# Patient Record
Sex: Female | Born: 1950 | Race: White | Hispanic: No
Health system: Northeastern US, Academic
[De-identification: ages and names within clinical notes are randomized; demographics above are authoritative.]

## PROBLEM LIST (undated history)

## (undated) DIAGNOSIS — I10 Essential (primary) hypertension: Secondary | ICD-10-CM

## (undated) DIAGNOSIS — M519 Unspecified thoracic, thoracolumbar and lumbosacral intervertebral disc disorder: Secondary | ICD-10-CM

## (undated) DIAGNOSIS — F419 Anxiety disorder, unspecified: Secondary | ICD-10-CM

## (undated) DIAGNOSIS — Z9889 Other specified postprocedural states: Secondary | ICD-10-CM

## (undated) DIAGNOSIS — C439 Malignant melanoma of skin, unspecified: Secondary | ICD-10-CM

## (undated) DIAGNOSIS — Z9851 Tubal ligation status: Secondary | ICD-10-CM

## (undated) DIAGNOSIS — E785 Hyperlipidemia, unspecified: Secondary | ICD-10-CM

## (undated) HISTORY — DX: Malignant melanoma of skin, unspecified: C43.9

## (undated) HISTORY — PX: BACK SURGERY: SHX140

## (undated) HISTORY — PX: ABDOMINAL HYSTERECTOMY: SHX81

## (undated) HISTORY — PX: DILATION AND CURETTAGE OF UTERUS: SHX78

## (undated) HISTORY — PX: TUBAL LIGATION: SHX77

---

## 2006-08-30 ENCOUNTER — Ambulatory Visit: Payer: Self-pay | Admitting: Internal Medicine

## 2007-01-14 ENCOUNTER — Ambulatory Visit: Payer: Self-pay | Admitting: Family Medicine

## 2009-06-02 ENCOUNTER — Ambulatory Visit: Payer: Self-pay

## 2009-06-16 ENCOUNTER — Encounter (INDEPENDENT_AMBULATORY_CARE_PROVIDER_SITE_OTHER): Payer: Self-pay | Admitting: Orthopedic Surgery

## 2009-06-16 ENCOUNTER — Ambulatory Visit (HOSPITAL_COMMUNITY): Admission: RE | Admit: 2009-06-16 | Discharge: 2009-06-17 | Payer: Self-pay | Admitting: Orthopedic Surgery

## 2010-11-23 LAB — BASIC METABOLIC PANEL
CO2: 27 mEq/L (ref 19–32)
Creatinine, Ser: 0.88 mg/dL (ref 0.4–1.2)
GFR calc Af Amer: >60 mL/min (ref >60)
GFR calc non Af Amer: >60 mL/min (ref >60)
Glucose, Bld: 98 mg/dL (ref 70–99)
Potassium: 3.5 mEq/L (ref 3.5–5.1)
Sodium: 137 mEq/L (ref 135–145)

## 2011-01-05 NOTE — Assessment & Plan Note (Signed)
[facility]                           [facility] OFFICE NOTE   JASIRA, ROBINSON                        MRN:          [id number]  DATE:08/30/2006                            DOB:          July 18, 1951    CHIEF COMPLAINT:  Here to establish a new doctor.   HISTORY OF PRESENT ILLNESS:  Ms. Ruben Gottron moved from [place]  approximately two years ago to [place] and then a few months ago  moved to [place].  She moved in the area to be near her daughter  following a recent divorce.  Today, she has the following concerns:  1. Ear pressure and nasal congestion:  She states that she has been      having problems off and on with allergies.  She has a sensation      that there is fluid in her ears as well as chronic nasal      congestion.  Her ears also itch as well as her nose itching.  She      occasionally sneezes.  In the past, she tried Flonase and it helped      significantly.  She would like to try this again.  2. Weight concerns:  Ms. Ruben Gottron states that she has been trying to      lose weight and is having difficulty.  She has a friend who was on      Phentermine and she wants to know the risks and benefits of this      medication.   REVIEW OF SYSTEMS:  Otherwise, negative.   PAST MEDICAL HISTORY:  None.   HOSPITALIZATIONS SURGERIES AND PROCEDURES:  1. Two C-sections.  2. In 1998, total abdominal hysterectomy for menorrhagia. .  3. At age 72, exploratory laparotomy, unknown etiology.  4. BTL.  5. Mammogram negative in 2005.   ALLERGIES:  None.   MEDICATIONS:  None.   SOCIAL HISTORY:  No alcohol, no tobacco, no drugs.  She is currently  unemployed and has not been previously working but is having to get a  job because of a recent divorce.  She was divorced within the last few  years.  She has four children who are healthy.  She walks daily about 6  miles per day.  She eats fruits, vegetables, fish, chicken, and lean  meats.   She avoids fast food.   FAMILY HISTORY:  Father deceased at age 47 following an MI at age 41 and  then, again, at age 77.  He also had diabetes.  Mother alive at age 9  with hypertension, diabetes, and COPD.  She has one brother who passed  away with cystic fibrosis.  She has four sisters, three of them have  diabetes.  There is no family history of any type of cancer.   PHYSICAL EXAMINATION:  VITAL SIGNS:  Height 52 1/2 inches, weight 159.6,  BMI 27, blood pressure 132/100, pulse 80, temperature 98.  GENERAL:  Overweight appearing female in no apparent distress.  HEENT:  PERRLA, extraocular movements intact, oropharynx clear, tympanic  membranes clear, nares with pale turbinates bilaterally,  no thyromegaly,  no lymphadenopathy, supraclavicular or cervical.  CARDIOVASCULAR:  Regular rate and rhythm, no murmurs, gallops, and rubs.  Normal PMI.  2+ peripheral pulses, no peripheral edema.  PULMONARY:  Clear to auscultation bilaterally, no wheezes, rales or  rhonchi.  ABDOMEN:  Soft, nontender, normal active bowel sounds, no  hepatosplenomegaly.  MUSCULOSKELETAL:  Strength 5/5 in the upper and lower extremities.  NEUROLOGICAL:  Cranial nerves 2-12 grossly intact.  Alert and oriented  x3.   ASSESSMENT/PLAN:  1. Allergic rhinitis:  Flonase 2 sprays per nostril daily.  She will      let me know if her symptoms do not improve.  2. Overweight:  We discussed healthy eating habits and increasing      exercise.  We discussed Phentermine and the fact that, given risk      benefit ratio, I do not believe this is a safe medication for her.      I did suggest that Orlistat may be something that would help her      more, although it does have GI side-effects.  If her blood pressure      is well controlled over the next few months and does not seem to be      elevated and this is an isolated measurement, given her new visit      and possible white coat syndrome, we can consider Meridia.  3.  Elevated blood pressure:  She will follow this at home and let me      know if it is greater than 140/90 for three measurements in a row.  4. Prevention:  She is due for a mammogram, colon cancer screening,      tetanus, and cholesterol panel.  She will look into insurance and      let me know if she would like to obtain these.  She was given a      tetanus vaccine today.  I will obtain records from her previous      doctor.     Kerby Nora, MD  Electronically Signed    AB/MedQ  DD: 08/30/2006  DT: 08/31/2006  Job #: [id number]

## 2012-01-03 ENCOUNTER — Ambulatory Visit: Payer: Self-pay | Admitting: Physical Medicine and Rehabilitation

## 2014-09-02 ENCOUNTER — Ambulatory Visit: Payer: Self-pay | Admitting: Physical Medicine and Rehabilitation

## 2015-04-15 ENCOUNTER — Encounter: Payer: Self-pay | Admitting: *Deleted

## 2015-04-18 ENCOUNTER — Encounter: Payer: Self-pay | Admitting: Anesthesiology

## 2015-04-18 ENCOUNTER — Ambulatory Visit: Payer: BLUE CROSS/BLUE SHIELD | Admitting: Anesthesiology

## 2015-04-18 ENCOUNTER — Encounter
Admission: RE | Disposition: A | Discharge status: Home / Self Care | Payer: Self-pay | Source: Ambulatory Visit | Attending: Gastroenterology

## 2015-04-18 ENCOUNTER — Ambulatory Visit
Admission: RE | Admit: 2015-04-18 | Discharge: 2015-04-18 | Disposition: A | Discharge status: Home / Self Care | Payer: BLUE CROSS/BLUE SHIELD | Source: Ambulatory Visit | Attending: Gastroenterology | Admitting: Gastroenterology

## 2015-04-18 DIAGNOSIS — Z90710 Acquired absence of both cervix and uterus: Secondary | ICD-10-CM | POA: Insufficient documentation

## 2015-04-18 DIAGNOSIS — K5730 Diverticulosis of large intestine without perforation or abscess without bleeding: Secondary | ICD-10-CM | POA: Insufficient documentation

## 2015-04-18 DIAGNOSIS — M5136 Other intervertebral disc degeneration, lumbar region: Secondary | ICD-10-CM | POA: Diagnosis not present

## 2015-04-18 DIAGNOSIS — F419 Anxiety disorder, unspecified: Secondary | ICD-10-CM | POA: Insufficient documentation

## 2015-04-18 DIAGNOSIS — E785 Hyperlipidemia, unspecified: Secondary | ICD-10-CM | POA: Diagnosis not present

## 2015-04-18 DIAGNOSIS — K644 Residual hemorrhoidal skin tags: Secondary | ICD-10-CM | POA: Diagnosis not present

## 2015-04-18 DIAGNOSIS — Z1211 Encounter for screening for malignant neoplasm of colon: Secondary | ICD-10-CM | POA: Diagnosis present

## 2015-04-18 DIAGNOSIS — K6389 Other specified diseases of intestine: Secondary | ICD-10-CM | POA: Diagnosis not present

## 2015-04-18 DIAGNOSIS — I10 Essential (primary) hypertension: Secondary | ICD-10-CM | POA: Diagnosis not present

## 2015-04-18 DIAGNOSIS — D124 Benign neoplasm of descending colon: Secondary | ICD-10-CM | POA: Diagnosis not present

## 2015-04-18 HISTORY — DX: Tubal ligation status: Z98.51

## 2015-04-18 HISTORY — DX: Unspecified thoracic, thoracolumbar and lumbosacral intervertebral disc disorder: M51.9

## 2015-04-18 HISTORY — DX: Other specified postprocedural states: Z98.890

## 2015-04-18 HISTORY — PX: COLONOSCOPY WITH PROPOFOL: SHX5780

## 2015-04-18 HISTORY — DX: Hyperlipidemia, unspecified: E78.5

## 2015-04-18 HISTORY — DX: Anxiety disorder, unspecified: F41.9

## 2015-04-18 HISTORY — DX: Essential (primary) hypertension: I10

## 2015-04-18 SURGERY — COLONOSCOPY WITH PROPOFOL
Anesthesia: General

## 2015-04-18 MED ORDER — PROPOFOL 10 MG/ML IV BOLUS
INTRAVENOUS | Status: DC | PRN
  Administered 2015-04-18: 40 mg via INTRAVENOUS
  Administered 2015-04-18: 70 mg via INTRAVENOUS
  Administered 2015-04-18: 30 mg via INTRAVENOUS
  Administered 2015-04-18: 40 mg via INTRAVENOUS

## 2015-04-18 MED ORDER — SODIUM CHLORIDE 0.9 % IV SOLN
INTRAVENOUS | Status: DC
  Administered 2015-04-18: 09:00:00 via INTRAVENOUS

## 2015-04-18 MED ORDER — SODIUM CHLORIDE 0.9 % IV SOLN: INTRAVENOUS | Status: DC

## 2015-04-18 MED ORDER — ONDANSETRON HCL 4 MG/2ML IJ SOLN
INTRAMUSCULAR | Status: DC | PRN
  Administered 2015-04-18: 4 mg via INTRAVENOUS

## 2015-04-18 MED ORDER — PROPOFOL INFUSION 10 MG/ML OPTIME
INTRAVENOUS | Status: DC | PRN
  Administered 2015-04-18: 100 ug/kg/min via INTRAVENOUS

## 2015-04-18 MED ORDER — GLYCOPYRROLATE 0.2 MG/ML IJ SOLN
INTRAMUSCULAR | Status: DC | PRN
  Administered 2015-04-18: 0.2 mg via INTRAVENOUS

## 2015-04-18 MED ORDER — LIDOCAINE HCL (CARDIAC) 20 MG/ML IV SOLN
INTRAVENOUS | Status: DC | PRN
  Administered 2015-04-18: 20 mg via INTRAVENOUS

## 2015-04-18 NOTE — Anesthesia Postprocedure Evaluation (Signed)
  Anesthesia Post-op Note  Patient: Shelby Douglas  Procedure(s) Performed: Procedure(s): COLONOSCOPY WITH PROPOFOL (N/A)  Anesthesia type:General  Patient location: PACU  Post pain: Pain level controlled  Post assessment: Post-op Vital signs reviewed, Patient's Cardiovascular Status Stable, Respiratory Function Stable, Patent Airway and No signs of Nausea or vomiting  Post vital signs: Reviewed and stable  Last Vitals:  Filed Vitals:   04/18/15 1019  BP: 105/60  Pulse:   Temp:   Resp:     Level of consciousness: awake, alert  and patient cooperative  Complications: No apparent anesthesia complications

## 2015-04-18 NOTE — Discharge Instructions (Signed)

## 2015-04-18 NOTE — H&P (Signed)
  Primary Care Physician:  Eliezer Lofts, MD  Pre-Procedure History & Physical: HPI:  Shelby Douglas is a 64 y.o. female is here for an colonoscopy.   Past Medical History  Diagnosis Date  . Hypertension   . Lumbar disc disease   . Hyperlipidemia   . Anxiety   . H/O tubal ligation   . H/O dilation and curettage     Past Surgical History  Procedure Laterality Date  . Abdominal hysterectomy    . Dilation and curettage of uterus    . Cesarean section      Prior to Admission medications   Medication Sig Start Date End Date Taking? Authorizing Provider  citalopram (CELEXA) 10 MG tablet Take 10 mg by mouth daily.   Yes Historical Provider, MD  cyclobenzaprine (FLEXERIL) 10 MG tablet Take 10 mg by mouth at bedtime as needed for muscle spasms.   Yes Historical Provider, MD  fluticasone (FLONASE) 50 MCG/ACT nasal spray Place into both nostrils daily.   Yes Historical Provider, MD  gabapentin (NEURONTIN) 300 MG capsule Take 300 mg by mouth 3 (three) times daily.   Yes Historical Provider, MD  lisinopril-hydrochlorothiazide (PRINZIDE,ZESTORETIC) 20-12.5 MG per tablet Take 1 tablet by mouth daily.   Yes Historical Provider, MD  meloxicam (MOBIC) 15 MG tablet Take 15 mg by mouth daily.   Yes Historical Provider, MD  traMADol (ULTRAM) 50 MG tablet Take by mouth 2 (two) times daily.   Yes Historical Provider, MD    Allergies as of 04/01/2015  . (Not on File)    No family history on file.  Social History   Social History  . Marital Status: Married    Spouse Name: N/A  . Number of Children: N/A  . Years of Education: N/A   Occupational History  . Not on file.   Social History Main Topics  . Smoking status: Never Smoker   . Smokeless tobacco: Never Used  . Alcohol Use: Not on file  . Drug Use: Not on file  . Sexual Activity: Not on file   Other Topics Concern  . Not on file   Social History Narrative  . No narrative on file     Physical Exam: BP 119/95 mmHg   Pulse 90  Temp(Src) 98.6 F (37 C) (Tympanic)  Resp 17  Ht 5\' 3"  (1.6 m)  Wt 77.111 kg (170 lb)  BMI 30.12 kg/m2  SpO2 100% General:   Alert,  pleasant and cooperative in NAD Head:  Normocephalic and atraumatic. Neck:  Supple; no masses or thyromegaly. Lungs:  Clear throughout to auscultation.    Heart:  Regular rate and rhythm. Abdomen:  Soft, nontender and nondistended. Normal bowel sounds, without guarding, and without rebound.   Neurologic:  Alert and  oriented x4;  grossly normal neurologically.  Impression/Plan: Shelby Douglas is here for an colonoscopy to be performed for screening  Risks, benefits, limitations, and alternatives regarding  colonoscopy have been reviewed with the patient.  Questions have been answered.  All parties agreeable.   Josefine Class, MD  04/18/2015, 9:02 AM

## 2015-04-18 NOTE — Anesthesia Preprocedure Evaluation (Signed)
Anesthesia Evaluation  Patient identified by MRN, date of birth, ID band Patient awake    Reviewed: Allergy & Precautions, H&P , NPO status , Patient's Chart, lab work & pertinent test results, reviewed documented beta blocker date and time   History of Anesthesia Complications Negative for: history of anesthetic complications  Airway Mallampati: II  TM Distance: >3 FB Neck ROM: full    Dental no notable dental hx. (+) Teeth Intact Permanent bridge on top left:   Pulmonary neg pulmonary ROS,  breath sounds clear to auscultation  Pulmonary exam normal       Cardiovascular Exercise Tolerance: Good hypertension, On Medications - angina- CAD, - Past MI, - Cardiac Stents and - CABG Normal cardiovascular exam- dysrhythmias - Valvular Problems/MurmursRhythm:regular Rate:Normal     Neuro/Psych PSYCHIATRIC DISORDERS (anxiety)  Neuromuscular disease (Lumbar degenerative disc disease)    GI/Hepatic negative GI ROS, Neg liver ROS,   Endo/Other  negative endocrine ROS  Renal/GU negative Renal ROS  negative genitourinary   Musculoskeletal   Abdominal   Peds  Hematology negative hematology ROS (+)   Anesthesia Other Findings Past Medical History:   Hypertension                                                 Lumbar disc disease                                          Hyperlipidemia                                               Anxiety                                                      H/O tubal ligation                                           H/O dilation and curettage                                   Reproductive/Obstetrics negative OB ROS                             Anesthesia Physical Anesthesia Plan  ASA: II  Anesthesia Plan: General   Post-op Pain Management:    Induction:   Airway Management Planned:   Additional Equipment:   Intra-op Plan:   Post-operative Plan:   Informed  Consent: I have reviewed the patients History and Physical, chart, labs and discussed the procedure including the risks, benefits and alternatives for the proposed anesthesia with the patient or authorized representative who has indicated his/her understanding and acceptance.   Dental Advisory Given  Plan Discussed with: Anesthesiologist, CRNA and Surgeon  Anesthesia Plan Comments:  Anesthesia Quick Evaluation  

## 2015-04-18 NOTE — Op Note (Signed)
[facility] Patient Name: Shelby Douglas Procedure Date: 04/18/2015 9:04 AM MRN: [id number] Account #: [id number] Date of Birth: [birth year] Admit Type: Outpatient Age: 64 Room: [facility] ROOM 3 Gender: Female Note Status: Finalized Procedure:         Colonoscopy Indications:       Screening for colorectal malignant neoplasm, This is the                     patient's first colonoscopy Patient Profile:   This is a 64 year old female. Providers:         Gerrit Heck. Rayann Heman, MD Referring MD:      Leonie Douglas. Doy Hutching, MD (Referring MD) Medicines:         Propofol per Anesthesia Complications:     No immediate complications. Procedure:         Pre-Anesthesia Assessment:                    - Prior to the procedure, a History and Physical was                     performed, and patient medications, allergies and                     sensitivities were reviewed. The patient's tolerance of                     previous anesthesia was reviewed.                    After obtaining informed consent, the colonoscope was                     passed under direct vision. Throughout the procedure, the                     patient's blood pressure, pulse, and oxygen saturations                     were monitored continuously. The Colonoscope was                     introduced through the anus and advanced to the the cecum,                     identified by appendiceal orifice and ileocecal valve. The                     colonoscopy was somewhat difficult due to a tortuous                     colon. Successful completion of the procedure was aided by                     changing the patient to a supine position. The patient                     tolerated the procedure well. The quality of the bowel                     preparation was good. Findings:      The perianal exam findings include non-thrombosed external hemorrhoids.      A 3 mm polyp was found in the descending colon.  The polyp was sessile.  The polyp was removed with a jumbo cold forceps. Resection and retrieval       were complete.      Many small-mouthed diverticula were found in the sigmoid colon.      The exam was otherwise without abnormality on direct and retroflexion       views. Impression:        - Non-thrombosed external hemorrhoids found on perianal                     exam.                    - One 3 mm polyp in the descending colon. Resected and                     retrieved.                    - Diverticulosis in the sigmoid colon.                    - The examination was otherwise normal on direct and                     retroflexion views. Recommendation:    - Observe patient in GI recovery unit.                    - High fiber diet.                    - Continue present medications.                    - Await pathology results.                    - Repeat colonoscopy for surveillance based on pathology                     results.                    - Return to referring physician.                    - The findings and recommendations were discussed with the                     patient.                    - The findings and recommendations were discussed with the                     patient's family. Procedure Code(s): --- Professional ---                    [phone], Colonoscopy, flexible; with biopsy, single or                     multiple CPT copyright 2014 American Medical Association. All rights reserved. The codes documented in this report are preliminary and upon coder review may  be revised to meet current compliance requirements. Mellody Life, MD 04/18/2015 9:41:25 AM This report has been signed electronically. Number of Addenda: 0 Note Initiated On: 04/18/2015 9:04 AM Scope Withdrawal Time: 0 hours 11 minutes 11 seconds  Total Procedure Duration: 0 hours 20 minutes 57 seconds       [place]  [facility]

## 2015-04-18 NOTE — Transfer of Care (Signed)
Immediate Anesthesia Transfer of Care Note  Patient: Shelby Douglas  Procedure(s) Performed: Procedure(s): COLONOSCOPY WITH PROPOFOL (N/A)  Patient Location: Endoscopy Unit  Anesthesia Type:General  Level of Consciousness: sedated  Airway & Oxygen Therapy: Patient Spontanous Breathing and Patient connected to nasal cannula oxygen  Post-op Assessment: Report given to RN and Post -op Vital signs reviewed and stable  Post vital signs: Reviewed and stable  Last Vitals:  Filed Vitals:   04/18/15 0824  BP: 119/95  Pulse: 90  Temp: 37 C  Resp: 17    Complications: No apparent anesthesia complications

## 2015-04-18 NOTE — Anesthesia Procedure Notes (Signed)
Date/Time: 04/18/2015 9:10 AM Performed by: Martha Clan Pre-anesthesia Checklist: Patient identified, Emergency Drugs available, Suction available, Patient being monitored and Timeout performed Patient Re-evaluated:Patient Re-evaluated prior to inductionOxygen Delivery Method: Nasal cannula Preoxygenation: Pre-oxygenation with 100% oxygen Intubation Type: IV induction

## 2015-04-19 ENCOUNTER — Encounter: Payer: Self-pay | Admitting: Gastroenterology

## 2015-04-19 LAB — SURGICAL PATHOLOGY

## 2016-03-06 ENCOUNTER — Ambulatory Visit (INDEPENDENT_AMBULATORY_CARE_PROVIDER_SITE_OTHER): Payer: BLUE CROSS/BLUE SHIELD | Admitting: Obstetrics and Gynecology

## 2016-03-06 ENCOUNTER — Encounter: Payer: Self-pay | Admitting: Obstetrics and Gynecology

## 2016-03-06 VITALS — BP 97/66 | HR 94 | Ht 63.0 in | Wt 168.1 lb

## 2016-03-06 DIAGNOSIS — N8110 Cystocele, unspecified: Secondary | ICD-10-CM | POA: Diagnosis not present

## 2016-03-06 DIAGNOSIS — N816 Rectocele: Secondary | ICD-10-CM | POA: Diagnosis not present

## 2016-03-06 DIAGNOSIS — N958 Other specified menopausal and perimenopausal disorders: Secondary | ICD-10-CM

## 2016-03-06 DIAGNOSIS — Z9079 Acquired absence of other genital organ(s): Secondary | ICD-10-CM | POA: Diagnosis not present

## 2016-03-06 DIAGNOSIS — Z90722 Acquired absence of ovaries, bilateral: Secondary | ICD-10-CM

## 2016-03-06 DIAGNOSIS — N3946 Mixed incontinence: Secondary | ICD-10-CM

## 2016-03-06 DIAGNOSIS — IMO0002 Reserved for concepts with insufficient information to code with codable children: Secondary | ICD-10-CM

## 2016-03-06 DIAGNOSIS — N952 Postmenopausal atrophic vaginitis: Secondary | ICD-10-CM | POA: Diagnosis not present

## 2016-03-06 DIAGNOSIS — K5900 Constipation, unspecified: Secondary | ICD-10-CM

## 2016-03-06 DIAGNOSIS — K5909 Other constipation: Secondary | ICD-10-CM

## 2016-03-06 DIAGNOSIS — E8940 Asymptomatic postprocedural ovarian failure: Secondary | ICD-10-CM

## 2016-03-06 DIAGNOSIS — Z90710 Acquired absence of both cervix and uterus: Secondary | ICD-10-CM | POA: Diagnosis not present

## 2016-03-06 MED ORDER — ESTRADIOL 0.1 MG/GM VA CREA: TOPICAL_CREAM | VAGINAL | Status: AC

## 2016-03-06 MED ORDER — TOLTERODINE TARTRATE ER 4 MG PO CP24: 4.0000 mg | ORAL_CAPSULE | Freq: Every evening | ORAL | Status: DC

## 2016-03-06 NOTE — Progress Notes (Signed)
GYN ENCOUNTER NOTE  Subjective:       Shelby Douglas is a 65 y.o. [id number] female is here for gynecologic evaluation of the following issues:  1. Stress urinary incontinence  GU history: Urinary frequency-6-7 times a day Nocturia-2 times a night.   Urgency-positive UTI history-no chronic infections Incomplete emptying-negative Positive Leaking with coughing sneezing laughing lifting; wears pads; no history of interventions such as Kegel exercises, pessary, etc.  GI history: Long history of chronic constipation; on MiraLAX and Colace Stools are formed and soft Positive splinting with bowel movements       Gynecologic History No LMP recorded. Patient has had a hysterectomy. Status post TAH/BSO  Contraception: status post hysterectomy  Family history of breast cancer: Maternal grandmother and daughter (age 65)    Obstetric History OB History  Gravida Para Term Preterm AB SAB TAB Ectopic Multiple Living  6 4 3 1 2 2    4     # Outcome Date GA Lbr Len/2nd Weight Sex Delivery Anes PTL Lv  6 Preterm 1986   4 lb (1.814 kg) F CS-LTranv   Y  5 SAB 1985          4 Term 1981   6 lb (2.722 kg) F CS-LTranv   Y  3 Term 1980   8 lb (3.629 kg) F Vag-Spont   Y  2 Term 1973   7 lb 8 oz (3.402 kg) F Vag-Spont   Y  1 SAB 1972              Past Medical History  Diagnosis Date  . Hypertension   . Lumbar disc disease   . Hyperlipidemia   . Anxiety   . H/O tubal ligation   . H/O dilation and curettage     Past Surgical History  Procedure Laterality Date  . Abdominal hysterectomy    . Dilation and curettage of uterus    . Cesarean section    . Colonoscopy with propofol N/A 04/18/2015    Procedure: COLONOSCOPY WITH PROPOFOL;  Surgeon: Josefine Class, MD;  Location: [facility] ENDOSCOPY;  Service: Endoscopy;  Laterality: N/A;    Current Outpatient Prescriptions on File Prior to Visit  Medication Sig Dispense Refill  . citalopram (CELEXA) 10 MG tablet Take 10 mg by mouth  daily.    . cyclobenzaprine (FLEXERIL) 10 MG tablet Take 10 mg by mouth at bedtime as needed for muscle spasms.    . fluticasone (FLONASE) 50 MCG/ACT nasal spray Place into both nostrils daily.    Marland Kitchen gabapentin (NEURONTIN) 300 MG capsule Take 300 mg by mouth 3 (three) times daily.    Marland Kitchen lisinopril-hydrochlorothiazide (PRINZIDE,ZESTORETIC) 20-12.5 MG per tablet Take 1 tablet by mouth daily.    . meloxicam (MOBIC) 15 MG tablet Take 15 mg by mouth daily.    . traMADol (ULTRAM) 50 MG tablet Take by mouth 2 (two) times daily.     No current facility-administered medications on file prior to visit.    Allergies  Allergen Reactions  . Latex     Social History   Social History  . Marital Status: Married    Spouse Name: N/A  . Number of Children: N/A  . Years of Education: N/A   Occupational History  . Not on file.   Social History Main Topics  . Smoking status: Never Smoker   . Smokeless tobacco: Never Used  . Alcohol Use: Yes     Comment: monthly  . Drug Use: No  . Sexual Activity:  Yes    Birth Control/ Protection: Surgical   Other Topics Concern  . Not on file   Social History Narrative    Family History  Problem Relation Age of Onset  . Diabetes Mother   . Heart disease Mother   . Diabetes Father   . Heart disease Father   . Diabetes Maternal Grandmother   . Heart disease Maternal Grandmother   . Diabetes Maternal Grandfather   . Heart disease Maternal Grandfather   . Breast cancer Paternal Grandmother   . Uterine cancer Paternal Grandmother   . Diabetes Paternal Grandmother   . Heart disease Paternal Grandmother   . Diabetes Paternal Grandfather   . Heart disease Paternal Grandfather   . Ovarian cancer Neg Hx   . Colon cancer Neg Hx     The following portions of the patient's history were reviewed and updated as appropriate: allergies, current medications, past family history, past medical history, past social history, past surgical history and problem  list.  Review of Systems Per history of present illness  Objective:   BP 97/66 mmHg  Pulse 94  Ht 5\' 3"  (1.6 m)  Wt 168 lb 1.6 oz (76.25 kg)  BMI 29.79 kg/m2 CONSTITUTIONAL: Well-developed, well-nourished female in no acute distress.  HENT:  Normocephalic, atraumatic.  NECK: Not examined SKIN: Skin is warm and dry. No rash noted. Not diaphoretic. No erythema. No pallor. [place]: Alert and oriented to person, place, and time. PSYCHIATRIC: Normal mood and affect. Normal behavior. Normal judgment and thought content. CARDIOVASCULAR:Not Examined RESPIRATORY: Not Examined BREASTS: Not Examined ABDOMEN: Soft, non distended; Non tender.  No Organomegaly. PELVIC:  External Genitalia: Normal  BUS: Normal  Vagina: Moderate atrophy; fair support (first to second-degree cystocele with Valsalva; mild rectocele)  Cervix:Surgically absent  Uterus:Surgically absent  Adnexa: Normal  RV: Normal external exam; unable to tolerate rectal  Bladder: Nontender MUSCULOSKELETAL: Normal range of motion. No tenderness.  No cyanosis, clubbing, or edema.     Assessment:   1. Mixed incontinence 2. Status post TAH/BSO 3. Menopausal, on no HRT 4. Moderate vaginal atrophy 5. First to second-degree cystocele 6. Mild rectocele   Plan:   1. Detrol LA 4 mg daily at bedtime 2. Estrace cream intravaginal twice weekly 3. Kegel exercises 4. Physical therapy for stress urinary incontinence-declined  5. Urology referral for vaginal sling 6. Return in 3 months  A total of 30 minutes were spent face-to-face with the patient during this encounter and over half of that time dealt with counseling and coordination of care.  Brayton Mars, MD  Note: This dictation was prepared with Dragon dictation along with smaller phrase technology. Any transcriptional errors that result from this process are unintentional.

## 2016-03-06 NOTE — Patient Instructions (Signed)
1. Begin Estrace cream 1/2 g intravaginal twice a week 2. Begin Detrol LA 4 mg at night 3. Urology referral for sling 4. Return in 3 months for follow-up  Urinary Incontinence Urinary incontinence is the involuntary loss of urine from your bladder. CAUSES  There are many causes of urinary incontinence. They include:  Medicines.  Infections.  Prostatic enlargement, leading to overflow of urine from your bladder.  Surgery.  Neurological diseases.  Emotional factors. SIGNS AND SYMPTOMS Urinary Incontinence can be divided into four types: 1. Urge incontinence. Urge incontinence is the involuntary loss of urine before you have the opportunity to go to the bathroom. There is a sudden urge to void but not enough time to reach a bathroom. 2. Stress incontinence. Stress incontinence is the sudden loss of urine with any activity that forces urine to pass. It is commonly caused by anatomical changes to the pelvis and sphincter areas of your body. 3. Overflow incontinence. Overflow incontinence is the loss of urine from an obstructed opening to your bladder. This results in a backup of urine and a resultant buildup of pressure within the bladder. When the pressure within the bladder exceeds the closing pressure of the sphincter, the urine overflows, which causes incontinence, similar to water overflowing a dam. 4. Total incontinence. Total incontinence is the loss of urine as a result of the inability to store urine within your bladder. DIAGNOSIS  Evaluating the cause of incontinence may require:  A thorough and complete medical and obstetric history.  A complete physical exam.  Laboratory tests such as a urine culture and sensitivities. When additional tests are indicated, they can include:  An ultrasound exam.  Kidney and bladder X-rays.  Cystoscopy. This is an exam of the bladder using a narrow scope.  Urodynamic testing to test the nerve function to the bladder and sphincter  areas. TREATMENT  Treatment for urinary incontinence depends on the cause:  For urge incontinence caused by a bacterial infection, antibiotics will be prescribed. If the urge incontinence is related to medicines you take, your health care provider may have you change the medicine.  For stress incontinence, surgery to re-establish anatomical support to the bladder or sphincter, or both, will often correct the condition.  For overflow incontinence caused by an enlarged prostate, an operation to open the channel through the enlarged prostate will allow the flow of urine out of the bladder. In women with fibroids, a hysterectomy may be recommended.  For total incontinence, surgery on your urinary sphincter may help. An artificial urinary sphincter (an inflatable cuff placed around the urethra) may be required. In women who have developed a hole-like passage between their bladder and vagina (vesicovaginal fistula), surgery to close the fistula often is required. HOME CARE INSTRUCTIONS  Normal daily hygiene and the use of pads or adult diapers that are changed regularly will help prevent odors and skin damage.  Avoid caffeine. It can overstimulate your bladder.  Use the bathroom regularly. Try about every 2-3 hours to go to the bathroom, even if you do not feel the need to do so. Take time to empty your bladder completely. After urinating, wait a minute. Then try to urinate again.  For causes involving nerve dysfunction, keep a log of the medicines you take and a journal of the times you go to the bathroom. SEEK MEDICAL CARE IF:  You experience worsening of pain instead of improvement in pain after your procedure.  Your incontinence becomes worse instead of better. SEE IMMEDIATE MEDICAL CARE  IF:  You experience fever or shaking chills.  You are unable to pass your urine.  You have redness spreading into your groin or down into your thighs. MAKE SURE YOU:   Understand these instructions.    Will watch your condition.  Will get help right away if you are not doing well or get worse.   This information is not intended to replace advice given to you by your health care provider. Make sure you discuss any questions you have with your health care provider.   Document Released: 09/13/2004 Document Revised: 08/27/2014 Document Reviewed: 01/13/2013 Elsevier Interactive Patient Education [employer].

## 2016-03-06 NOTE — Addendum Note (Signed)
Addended by: Malachi Paradise on: 03/06/2016 03:54 PM   Modules accepted: Orders, SmartSet

## 2016-03-26 ENCOUNTER — Encounter: Payer: Self-pay | Admitting: Urology

## 2016-03-26 ENCOUNTER — Ambulatory Visit (INDEPENDENT_AMBULATORY_CARE_PROVIDER_SITE_OTHER): Payer: BLUE CROSS/BLUE SHIELD | Admitting: Urology

## 2016-03-26 VITALS — BP 94/66 | HR 101 | Ht 67.0 in | Wt 171.8 lb

## 2016-03-26 DIAGNOSIS — N3946 Mixed incontinence: Secondary | ICD-10-CM | POA: Diagnosis not present

## 2016-03-26 LAB — MICROSCOPIC EXAMINATION

## 2016-03-26 LAB — URINALYSIS, COMPLETE
BILIRUBIN UA: NEGATIVE
GLUCOSE, UA: NEGATIVE
Nitrite, UA: NEGATIVE
RBC UA: NEGATIVE
Specific Gravity, UA: 1.025 (ref 1.005–1.030)
UUROB: 1 mg/dL (ref 0.2–1.0)
pH, UA: 5.5 (ref 5.0–7.5)

## 2016-03-26 LAB — BLADDER SCAN AMB NON-IMAGING: SCAN RESULT: 17

## 2016-03-26 NOTE — Addendum Note (Signed)
Addended by: Wilson Singer on: 03/26/2016 02:24 PM   Modules accepted: Orders

## 2016-03-26 NOTE — Progress Notes (Signed)
03/26/2016 1:57 PM   Shelby Douglas [birth year] [id number]  Referring provider: Jinny Sanders, MD [address] [place], [place] [address]  Chief Complaint  Patient presents with  . New Patient (Initial Visit)    incontinence    The patient HPI: The patient recently was assessed by gynecology for incontinence and was given Detrol and offered physical therapy. For the last year the patient sometimes leaks with coughing sneezing but also with bending and lifting. The primary promise urge incontinence wearing 2 pads a day moderately wet. The patient does not complain of bedwetting but gets up 4 times at night. He may be taking a diuretic and denies ankle edema. She voids every one hour and is not certain if she consented to a 2 are moving  Her flow was good. She had ureteroscopy years ago. She's had low back surgery. She has had a hysterectomy  She does not get bladder infections and she has no neurologic issues. She is prone to constipation  On Detrol recently prescribed she no longer has nocturia is now pad free with reduced urge incontinence  Modifying factors: There are no other modifying factors  Associated signs and symptoms: There are no other associated signs and symptoms Aggravating and relieving factors: There are no other aggravating or relieving factors Severity: Moderate Duration: Greatly improved recently   PMH: Past Medical History:  Diagnosis Date  . Anxiety   . H/O dilation and curettage   . H/O tubal ligation   . Hyperlipidemia   . Hypertension   . Lumbar disc disease     Surgical History: Past Surgical History:  Procedure Laterality Date  . ABDOMINAL HYSTERECTOMY    . BACK SURGERY    . CESAREAN SECTION    . COLONOSCOPY WITH PROPOFOL N/A 04/18/2015   Procedure: COLONOSCOPY WITH PROPOFOL;  Surgeon: Josefine Class, MD;  Location[facility] ENDOSCOPY;  Service: Endoscopy;  Laterality: N/A;  . DILATION AND CURETTAGE OF UTERUS    . TUBAL  LIGATION      Home Medications:    Medication List       Accurate as of 03/26/16  1:57 PM. Always use your most recent med list.          citalopram 10 MG tablet Commonly known as:  CELEXA Take 10 mg by mouth daily.   cyclobenzaprine 10 MG tablet Commonly known as:  FLEXERIL Take 10 mg by mouth at bedtime as needed for muscle spasms.   estradiol 0.1 MG/GM vaginal cream Commonly known as:  ESTRACE VAGINAL 1/2 g twice a week intravaginal   fluticasone 50 MCG/ACT nasal spray Commonly known as:  FLONASE Place into both nostrils daily.   gabapentin 300 MG capsule Commonly known as:  NEURONTIN Take 300 mg by mouth 3 (three) times daily.   lisinopril-hydrochlorothiazide 20-12.5 MG tablet Commonly known as:  PRINZIDE,ZESTORETIC Take 1 tablet by mouth daily.   meloxicam 15 MG tablet Commonly known as:  MOBIC Take 15 mg by mouth daily.   tolterodine 4 MG 24 hr capsule Commonly known as:  DETROL LA Take 1 capsule (4 mg total) by mouth Nightly.   traMADol 50 MG tablet Commonly known as:  ULTRAM Take by mouth 2 (two) times daily.       Allergies:  Allergies  Allergen Reactions  . Latex     Family History: Family History  Problem Relation Age of Onset  . Diabetes Mother   . Heart disease Mother   . Diabetes Father   .  Heart disease Father   . Diabetes Maternal Grandmother   . Heart disease Maternal Grandmother   . Diabetes Maternal Grandfather   . Heart disease Maternal Grandfather   . Breast cancer Paternal Grandmother   . Uterine cancer Paternal Grandmother   . Diabetes Paternal Grandmother   . Heart disease Paternal Grandmother   . Diabetes Paternal Grandfather   . Heart disease Paternal Grandfather   . Ovarian cancer Neg Hx   . Colon cancer Neg Hx     Social History:  reports that she has never smoked. She has never used smokeless tobacco. She reports that she drinks alcohol. She reports that she does not use drugs.  ROS: UROLOGY Frequent  Urination?: Yes Hard to postpone urination?: Yes Burning/pain with urination?: No Get up at night to urinate?: No Leakage of urine?: Yes Urine stream starts and stops?: No Trouble starting stream?: No Do you have to strain to urinate?: No Blood in urine?: No Urinary tract infection?: No Sexually transmitted disease?: No Injury to kidneys or bladder?: No Painful intercourse?: No Weak stream?: No Currently pregnant?: No Vaginal bleeding?: No Last menstrual period?: No  Gastrointestinal Nausea?: No Vomiting?: No Indigestion/heartburn?: No Diarrhea?: No Constipation?: Yes  Constitutional Fever: No Night sweats?: No Weight loss?: No Fatigue?: No  Skin Skin rash/lesions?: No Itching?: No  Eyes Blurred vision?: No Double vision?: No  Ears/Nose/Throat Sore throat?: No Sinus problems?: No  Hematologic/Lymphatic Swollen glands?: No Easy bruising?: No  Cardiovascular Leg swelling?: No Chest pain?: No  Respiratory Cough?: No Shortness of breath?: No  Endocrine Excessive thirst?: No  Musculoskeletal Back pain?: Yes Joint pain?: No  Neurological Headaches?: No Dizziness?: No  Psychologic Depression?: No Anxiety?: No  Physical Exam: BP 94/66   Pulse (!) 101   Ht 5\' 7"  (1.702 m)   Wt 171 lb 12.8 oz (77.9 kg)   BMI 26.91 kg/m   Constitutional:  Alert and oriented, No acute distress. HEENT: [place] AT, moist mucus membranes.  Trachea midline, no masses. Cardiovascular: No clubbing, cyanosis, or edema. Respiratory: Normal respiratory effort, no increased work of breathing. GI: Abdomen is soft, nontender, nondistended, no abdominal masses GU: No CVA tenderness. Grade 1 hypermobility of the bladder neck and no stress incontinence. No prolapse Skin: No rashes, bruises or suspicious lesions. Lymph: No cervical or inguinal adenopathy. Neurologic: Grossly intact, no focal deficits, moving all 4 extremities. Psychiatric: Normal mood and affect.  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 Her nighttime frequency was moderately severe but is greatly improved as well. She does have a frequent bladder.  The patient has high-grade treatment goals but she agreed with me that she is actually doing very well on Detrol. I will reassess her in 3 months. Pathophysiology of incontinence and role of urodynamics discussed. I did not think she needs to this at this stage.  1. 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 Nocturia 3. Increased frequency   - Urinalysis, Complete   No Follow-up on file.  Reece Packer, MD  [facility] [address], [place],  [address] [phone]

## 2016-04-20 HISTORY — PX: SKIN CANCER EXCISION: SHX779

## 2016-05-17 ENCOUNTER — Emergency Department: Payer: BLUE CROSS/BLUE SHIELD

## 2016-05-17 ENCOUNTER — Observation Stay
Admission: EM | Admit: 2016-05-17 | Discharge: 2016-05-19 | Disposition: A | Discharge status: Home / Self Care | Payer: BLUE CROSS/BLUE SHIELD | Attending: Internal Medicine | Admitting: Internal Medicine

## 2016-05-17 DIAGNOSIS — I959 Hypotension, unspecified: Secondary | ICD-10-CM | POA: Insufficient documentation

## 2016-05-17 DIAGNOSIS — N816 Rectocele: Secondary | ICD-10-CM | POA: Diagnosis not present

## 2016-05-17 DIAGNOSIS — N3946 Mixed incontinence: Secondary | ICD-10-CM | POA: Insufficient documentation

## 2016-05-17 DIAGNOSIS — Z803 Family history of malignant neoplasm of breast: Secondary | ICD-10-CM | POA: Insufficient documentation

## 2016-05-17 DIAGNOSIS — N3289 Other specified disorders of bladder: Secondary | ICD-10-CM | POA: Insufficient documentation

## 2016-05-17 DIAGNOSIS — Z90710 Acquired absence of both cervix and uterus: Secondary | ICD-10-CM | POA: Insufficient documentation

## 2016-05-17 DIAGNOSIS — Z79899 Other long term (current) drug therapy: Secondary | ICD-10-CM | POA: Diagnosis not present

## 2016-05-17 DIAGNOSIS — N170 Acute kidney failure with tubular necrosis: Principal | ICD-10-CM | POA: Insufficient documentation

## 2016-05-17 DIAGNOSIS — M549 Dorsalgia, unspecified: Secondary | ICD-10-CM | POA: Insufficient documentation

## 2016-05-17 DIAGNOSIS — Z91040 Latex allergy status: Secondary | ICD-10-CM | POA: Insufficient documentation

## 2016-05-17 DIAGNOSIS — N19 Unspecified kidney failure: Secondary | ICD-10-CM | POA: Diagnosis present

## 2016-05-17 DIAGNOSIS — I10 Essential (primary) hypertension: Secondary | ICD-10-CM | POA: Diagnosis not present

## 2016-05-17 DIAGNOSIS — Z8049 Family history of malignant neoplasm of other genital organs: Secondary | ICD-10-CM | POA: Diagnosis not present

## 2016-05-17 DIAGNOSIS — S0003XA Contusion of scalp, initial encounter: Secondary | ICD-10-CM | POA: Diagnosis not present

## 2016-05-17 DIAGNOSIS — W19XXXA Unspecified fall, initial encounter: Secondary | ICD-10-CM | POA: Insufficient documentation

## 2016-05-17 DIAGNOSIS — E785 Hyperlipidemia, unspecified: Secondary | ICD-10-CM | POA: Insufficient documentation

## 2016-05-17 DIAGNOSIS — N952 Postmenopausal atrophic vaginitis: Secondary | ICD-10-CM | POA: Diagnosis not present

## 2016-05-17 DIAGNOSIS — F419 Anxiety disorder, unspecified: Secondary | ICD-10-CM | POA: Insufficient documentation

## 2016-05-17 DIAGNOSIS — E871 Hypo-osmolality and hyponatremia: Secondary | ICD-10-CM | POA: Diagnosis not present

## 2016-05-17 DIAGNOSIS — N8110 Cystocele, unspecified: Secondary | ICD-10-CM | POA: Diagnosis not present

## 2016-05-17 DIAGNOSIS — Z8249 Family history of ischemic heart disease and other diseases of the circulatory system: Secondary | ICD-10-CM | POA: Diagnosis not present

## 2016-05-17 DIAGNOSIS — S01112A Laceration without foreign body of left eyelid and periocular area, initial encounter: Secondary | ICD-10-CM | POA: Insufficient documentation

## 2016-05-17 DIAGNOSIS — Z833 Family history of diabetes mellitus: Secondary | ICD-10-CM | POA: Diagnosis not present

## 2016-05-17 DIAGNOSIS — K5909 Other constipation: Secondary | ICD-10-CM | POA: Insufficient documentation

## 2016-05-17 DIAGNOSIS — G8929 Other chronic pain: Secondary | ICD-10-CM | POA: Diagnosis not present

## 2016-05-17 DIAGNOSIS — Z9851 Tubal ligation status: Secondary | ICD-10-CM | POA: Insufficient documentation

## 2016-05-17 MED ORDER — SODIUM CHLORIDE 0.9 % IV BOLUS (SEPSIS)
1000.0000 mL | Freq: Once | INTRAVENOUS | Status: AC
  Administered 2016-05-17: 1000 mL via INTRAVENOUS

## 2016-05-17 NOTE — ED Notes (Signed)
Patient transported to CT 

## 2016-05-17 NOTE — ED Notes (Signed)
MD at bedside. 

## 2016-05-17 NOTE — ED Triage Notes (Addendum)
Pt to triage via w/c; Pt reports "leg gave out", fell hitting corner of bed; c/o generalized HA; hematoma no right eye; lac to left eyelid; pt hypotensive in triage; cervical tenderness with palpation--c collar applied; charge nurse notified and pt taken to room 16 by EDT Mayra

## 2016-05-17 NOTE — ED Provider Notes (Signed)
[facility] Provider Note   First MD Initiated Contact with Patient 05/17/16 2320     (approximate)  I have reviewed the triage vital signs and the nursing notes.   HISTORY  Chief Complaint Fall   HPI Shelby Douglas is a 65 y.o. female with history of hypertension hyperlipidemia and lumbar disc disease presents status post fall. Patient states that she has difficulty with her leg and as a result gave out tonight resulting in her falling hitting the left side of her head against the corner of her bed. Patient admits to generalized headache. Of note patient recently had surgery performed and as such is prescribed tramadol which she stated that she take more than prescribed today patient states that she took an total 5 tramadol today. Patient admits to current headache with the pain score 7 out of 10.   Past Medical History:  Diagnosis Date  . Anxiety   . H/O dilation and curettage   . H/O tubal ligation   . Hyperlipidemia   . Hypertension   . Lumbar disc disease     Patient Active Problem List   Diagnosis Date Noted  . Hypotension 05/18/2016  . Renal failure 05/18/2016  . Chronic constipation 03/06/2016  . 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 Status post total abdominal hysterectomy and bilateral salpingo-oophorectomy (TAH-BSO) 03/06/2016  . 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 Cystocele 03/06/2016  . Rectocele 03/06/2016  . Vaginal atrophy 03/06/2016    Past Surgical History:  Procedure Laterality Date  . ABDOMINAL HYSTERECTOMY    . BACK SURGERY    . CESAREAN SECTION    . COLONOSCOPY WITH PROPOFOL N/A 04/18/2015   Procedure: COLONOSCOPY WITH PROPOFOL;  Surgeon: Josefine Class, MD;  Location: [facility];  Service: Endoscopy;  Laterality: N/A;  . DILATION AND CURETTAGE OF UTERUS    . TUBAL LIGATION      Prior to Admission medications   Medication Sig Start Date End Date Taking?  Authorizing Provider  citalopram (CELEXA) 10 MG tablet Take 10 mg by mouth daily.   Yes Historical Provider, MD  cyclobenzaprine (FLEXERIL) 10 MG tablet Take 10 mg by mouth at bedtime as needed for muscle spasms.   Yes Historical Provider, MD  estradiol (ESTRACE VAGINAL) 0.1 MG/GM vaginal cream 1/2 g twice a week intravaginal 03/06/16  Yes Alanda Slim Defrancesco, MD  fluticasone (FLONASE) 50 MCG/ACT nasal spray Place into both nostrils daily.   Yes Historical Provider, MD  gabapentin (NEURONTIN) 300 MG capsule Take 300 mg by mouth 3 (three) times daily.   Yes Historical Provider, MD  lisinopril-hydrochlorothiazide (PRINZIDE,ZESTORETIC) 20-12.5 MG per tablet Take 1 tablet by mouth daily.   Yes Historical Provider, MD  meloxicam (MOBIC) 15 MG tablet Take 15 mg by mouth daily.   Yes Historical Provider, MD  tolterodine (DETROL LA) 4 MG 24 hr capsule Take 1 capsule (4 mg total) by mouth Nightly. 03/06/16  Yes Alanda Slim Defrancesco, MD  traMADol (ULTRAM) 50 MG tablet Take by mouth 2 (two) times daily.   Yes Historical Provider, MD    Allergies Latex  Family History  Problem Relation Age of Onset  . Diabetes Mother   . Heart disease Mother   . Diabetes Father   . Heart disease Father   . Diabetes Maternal Grandmother   . Heart disease Maternal Grandmother   . Diabetes Maternal Grandfather   . Heart disease Maternal Grandfather   . Breast cancer Paternal Grandmother   .  Uterine cancer Paternal Grandmother   . Diabetes Paternal Grandmother   . Heart disease Paternal Grandmother   . Diabetes Paternal Grandfather   . Heart disease Paternal Grandfather   . Ovarian cancer Neg Hx   . Colon cancer Neg Hx     Social History Social History  Substance Use Topics  . Smoking status: Never Smoker  . Smokeless tobacco: Never Used  . Alcohol use No     Comment: monthly    Review of Systems Constitutional: No fever/chills Eyes: No visual changes. ENT: No sore throat. Cardiovascular: Denies chest  pain. Respiratory: Denies shortness of breath. Gastrointestinal: No abdominal pain.  No nausea, no vomiting.  No diarrhea.  No constipation. Genitourinary: Negative for dysuria. Musculoskeletal: Negative for back pain. Skin: Negative for rash. Neurological: Positive for headaches, negative for focal weakness or numbness.  10-point ROS otherwise negative.  ____________________________________________   PHYSICAL EXAM:  VITAL SIGNS: ED Triage Vitals  Enc Vitals Group     BP 05/17/16 2311 (!) 74/50     Pulse Rate 05/17/16 2311 89     Resp 05/17/16 2311 18     Temp 05/17/16 2311 98.2 F (36.8 C)     Temp Source 05/17/16 2311 Oral     SpO2 05/17/16 2311 94 %     Weight 05/17/16 2317 157 lb (71.2 kg)     Height 05/17/16 2317 5' (1.524 m)     Head Circumference --      Peak Flow --      Pain Score 05/17/16 2317 7     Pain Loc --      Pain Edu? --      Excl. in [place]? --     Constitutional: Alert But somnolent. In no apparent distress Eyes: Conjunctivae are normal. PERRL. EOMI. Head: Approximate 5 cm linear laceration left upper eyebrow Ears:  Healthy appearing ear canals and TMs bilaterally Nose: No congestion/rhinnorhea. Mouth/Throat: Mucous membranes are moist.  Oropharynx non-erythematous. Neck: No stridor.  No meningeal signs.  C3-6 to palpation. Cardiovascular: Normal rate, regular rhythm. Good peripheral circulation. Grossly normal heart sounds. Respiratory: Normal respiratory effort.  No retractions. Lungs CTAB. Gastrointestinal: Soft and nontender. No distention.  Musculoskeletal: No lower extremity tenderness nor edema. No gross deformities of extremities. Neurologic:  Normal speech and language. No gross focal neurologic deficits are appreciated.  Skin:  Skin is warm, dry and intact. No rash noted. Psychiatric: Mood and affect are normal. Speech and behavior are normal.  ____________________________________________   LABS (all labs ordered are listed, but only  abnormal results are displayed)  Labs Reviewed  BASIC METABOLIC PANEL - Abnormal; Notable for the following:       Result Value   Sodium 133 (*)    Glucose, Bld 116 (*)    BUN 44 (*)    Creatinine, Ser 2.54 (*)    Calcium 8.2 (*)    GFR calc non Af Amer 19 (*)    GFR calc Af Amer 22 (*)    All other components within normal limits  CBC - Abnormal; Notable for the following:    WBC 11.7 (*)    RBC 3.44 (*)    Hemoglobin 10.2 (*)    HCT 30.0 (*)    RDW 14.6 (*)    All other components within normal limits  BASIC METABOLIC PANEL - Abnormal; Notable for the following:    BUN 42 (*)    Creatinine, Ser 2.27 (*)    Calcium 7.6 (*)  GFR calc non Af Amer 22 (*)    GFR calc Af Amer 25 (*)    Anion gap 4 (*)    All other components within normal limits  TROPONIN I   ____________________________________________  EKG  ED ECG REPORT I, Prue N Annastacia Duba, the attending physician, personally viewed and interpreted this ECG.   Date: 05/18/2016  EKG Time: 12:12 AM  Rate: 83  Rhythm: Normal sinus rhythm  Axis: Normal  Intervals: Normal  ST&T Change: None  ____________________________________________  RADIOLOGY I, Delta N Felix Pratt, personally viewed and evaluated these images (plain radiographs) as part of my medical decision making, as well as reviewing the written report by the radiologist.  Ct Head Wo Contrast  Result Date: 05/18/2016 CLINICAL DATA:  Leg gave fell, fell hitting corner of bed. Generalized headache. LEFT eyelid laceration. Hypotensive. Cervical spine tenderness. History of hypertension. EXAM: CT HEAD WITHOUT CONTRAST CT CERVICAL SPINE WITHOUT CONTRAST TECHNIQUE: Multidetector CT imaging of the head and cervical spine was performed following the standard protocol without intravenous contrast. Multiplanar CT image reconstructions of the cervical spine were also generated. COMPARISON:  None. FINDINGS: CT HEAD FINDINGS BRAIN: The ventricles and sulci are normal. No  intraparenchymal hemorrhage, mass effect nor midline shift. No acute large vascular territory infarcts. No abnormal extra-axial fluid collections. Basal cisterns are patent. 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 VASCULAR: Trace calcific atherosclerosis of the carotid siphons. SKULL/SOFT TISSUES: No skull fracture. Small LEFT periorbital scalp hematoma. No subcutaneous gas or radiopaque foreign bodies. ORBITS/SINUSES: Preservation of the orbital fat. Mildly elongated ocular globes associated myopia or increased intra-ocular pressure. The mastoid air-cells and included paranasal sinuses are well-aerated. OTHER: None. CT CERVICAL SPINE FINDINGS ALIGNMENT: Minimal grade 1 C3-4 anterolisthesis. Maintained lordosis. SKULL BASE AND VERTEBRAE: Cervical vertebral bodies and posterior elements are intact. Moderate C4-5, mild C5-6 disc height loss with uncovertebral hypertrophy. Severe LEFT C2-3 and C3-4 facet arthropathy and mild facet widening most consistent with effusion and periarticular regularity. C1-2 articulation maintained with moderate arthropathy. No destructive bony lesions. SOFT TISSUES AND SPINAL CANAL: Normal. DISC LEVELS: No significant osseous canal stenosis. Moderate to severe LEFT C2-3 and LEFT C3-4, RIGHT C4-5 neural foraminal narrowing. UPPER CHEST: Lung apices are clear. OTHER: None. IMPRESSION: CT HEAD: Small LEFT periorbital scalp hematoma without postseptal extent. No acute intracranial process. **An incidental finding of potential clinical significance has been found. 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 Minimal grade 1 C3-4 anterolisthesis associated with severe LEFT facet arthropathy and chronic inflammatory changes. Electronically Signed   By: Elon Alas M.D.   On: 05/18/2016 00:20   Ct Cervical Spine Wo Contrast  Result Date: 05/18/2016 CLINICAL DATA:   Leg gave fell, fell hitting corner of bed. Generalized headache. LEFT eyelid laceration. Hypotensive. Cervical spine tenderness. History of hypertension. EXAM: CT HEAD WITHOUT CONTRAST CT CERVICAL SPINE WITHOUT CONTRAST TECHNIQUE: Multidetector CT imaging of the head and cervical spine was performed following the standard protocol without intravenous contrast. Multiplanar CT image reconstructions of the cervical spine were also generated. COMPARISON:  None. FINDINGS: CT HEAD FINDINGS BRAIN: The ventricles and sulci are normal. No intraparenchymal hemorrhage, mass effect nor midline shift. No acute large vascular territory infarcts. No abnormal extra-axial fluid collections. Basal cisterns are patent. 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 VASCULAR: Trace calcific atherosclerosis of the  carotid siphons. SKULL/SOFT TISSUES: No skull fracture. Small LEFT periorbital scalp hematoma. No subcutaneous gas or radiopaque foreign bodies. ORBITS/SINUSES: Preservation of the orbital fat. Mildly elongated ocular globes associated myopia or increased intra-ocular pressure. The mastoid air-cells and included paranasal sinuses are well-aerated. OTHER: None. CT CERVICAL SPINE FINDINGS ALIGNMENT: Minimal grade 1 C3-4 anterolisthesis. Maintained lordosis. SKULL BASE AND VERTEBRAE: Cervical vertebral bodies and posterior elements are intact. Moderate C4-5, mild C5-6 disc height loss with uncovertebral hypertrophy. Severe LEFT C2-3 and C3-4 facet arthropathy and mild facet widening most consistent with effusion and periarticular regularity. C1-2 articulation maintained with moderate arthropathy. No destructive bony lesions. SOFT TISSUES AND SPINAL CANAL: Normal. DISC LEVELS: No significant osseous canal stenosis. Moderate to severe LEFT C2-3 and LEFT C3-4, RIGHT C4-5 neural foraminal narrowing. UPPER CHEST: Lung apices are clear. OTHER: None. IMPRESSION: CT HEAD: Small LEFT periorbital scalp hematoma  without postseptal extent. No acute intracranial process. **An incidental finding of potential clinical significance has been found. 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 Minimal grade 1 C3-4 anterolisthesis associated with severe LEFT facet arthropathy and chronic inflammatory changes. Electronically Signed   By: Elon Alas M.D.   On: 05/18/2016 00:20     .Marland KitchenLaceration Repair Date/Time: 05/18/2016 8:18 AM Performed by: Gregor Hams Authorized by: Gregor Hams   Consent:    Consent obtained:  Verbal   Consent given by:  Patient   Risks discussed:  Infection, pain and poor cosmetic result   Alternatives discussed:  No treatment Anesthesia (see MAR for exact dosages):    Anesthesia method:  Local infiltration   Local anesthetic:  Lidocaine 1% w/o epi Laceration details:    Location:  Face   Face location:  L eyebrow   Length (cm):  5   Depth (mm):  1 Repair type:    Repair type:  Simple Pre-procedure details:    Preparation:  Patient was prepped and draped in usual sterile fashion Exploration:    Contaminated: no   Treatment:    Area cleansed with:  Betadine and saline   Amount of cleaning:  Standard   Visualized foreign bodies/material removed: no   Skin repair:    Repair method:  Sutures   Suture size:  6-0   Suture material:  Nylon   Suture technique:  Simple interrupted   Number of sutures:  8 Approximation:    Approximation:  Close   Vermilion border: well-aligned   Post-procedure details:    Dressing:  Antibiotic ointment   Patient tolerance of procedure:  Tolerated well, no immediate complications      INITIAL IMPRESSION / ASSESSMENT AND PLAN / ED COURSE  Pertinent labs & imaging results that were available during my care of the patient were reviewed by me and considered in my medical decision making (see chart for details).  Patient received 2  L IV normal saline with improvement of creatinine from 2.5-2.2 however patient remains hypotensive. As such patient admitted to the hospital for further evaluation and management for acute renal insufficiency   Clinical Course    ____________________________________________  FINAL CLINICAL IMPRESSION(S) / ED DIAGNOSES  Final diagnoses:  Renal failure  Acute renal insufficiency Left eyebrow laceration   MEDICATIONS GIVEN DURING THIS VISIT:  Medications  lidocaine (PF) (XYLOCAINE) 1 % injection (not administered)  fesoterodine (TOVIAZ) tablet 4 mg (not administered)  citalopram (CELEXA) tablet 10 mg (not administered)  fluticasone (  FLONASE) 50 MCG/ACT nasal spray 1 spray (not administered)  heparin injection 5,000 Units (not administered)  0.9 %  sodium chloride infusion (not administered)  acetaminophen (TYLENOL) tablet 650 mg (not administered)    Or  acetaminophen (TYLENOL) suppository 650 mg (not administered)  docusate sodium (COLACE) capsule 100 mg (not administered)  HYDROcodone-acetaminophen (NORCO/VICODIN) 5-325 MG per tablet 1-2 tablet (not administered)  ondansetron (ZOFRAN) tablet 4 mg (not administered)    Or  ondansetron (ZOFRAN) injection 4 mg (not administered)  morphine 2 MG/ML injection 2 mg (not administered)  sodium chloride 0.9 % bolus 1,000 mL (0 mLs Intravenous Stopped 05/18/16 0205)  ketorolac (TORADOL) 30 MG/ML injection 30 mg (30 mg Intravenous Given 05/18/16 0047)  sodium chloride 0.9 % bolus 1,000 mL (0 mLs Intravenous Stopped 05/18/16 0644)     NEW OUTPATIENT MEDICATIONS STARTED DURING THIS VISIT:  Current Discharge Medication List      Current Discharge Medication List      Current Discharge Medication List       Note:  This document was prepared using Dragon voice recognition software and may include unintentional dictation errors.    Gregor Hams, MD 05/18/16 [phone]

## 2016-05-18 ENCOUNTER — Encounter: Payer: Self-pay | Admitting: *Deleted

## 2016-05-18 ENCOUNTER — Observation Stay: Payer: BLUE CROSS/BLUE SHIELD

## 2016-05-18 DIAGNOSIS — N19 Unspecified kidney failure: Secondary | ICD-10-CM | POA: Diagnosis present

## 2016-05-18 DIAGNOSIS — I959 Hypotension, unspecified: Secondary | ICD-10-CM | POA: Diagnosis present

## 2016-05-18 LAB — BASIC METABOLIC PANEL
ANION GAP: 4 — AB (ref 5–15)
Anion gap: 9 (ref 5–15)
BUN: 42 mg/dL — ABNORMAL HIGH (ref 6–20)
BUN: 44 mg/dL — ABNORMAL HIGH (ref 6–20)
CHLORIDE: 102 mmol/L (ref 101–111)
CO2: 22 mmol/L (ref 22–32)
CO2: 24 mmol/L (ref 22–32)
CREATININE: 2.54 mg/dL — AB (ref 0.44–1.00)
Calcium: 7.6 mg/dL — ABNORMAL LOW (ref 8.9–10.3)
Calcium: 8.2 mg/dL — ABNORMAL LOW (ref 8.9–10.3)
Chloride: 107 mmol/L (ref 101–111)
Creatinine, Ser: 2.27 mg/dL — ABNORMAL HIGH (ref 0.44–1.00)
GFR calc non Af Amer: 19 mL/min — ABNORMAL LOW (ref >60)
GFR, EST AFRICAN AMERICAN: 22 mL/min — AB (ref >60)
GFR, EST AFRICAN AMERICAN: 25 mL/min — AB (ref >60)
GFR, EST NON AFRICAN AMERICAN: 22 mL/min — AB (ref >60)
Glucose, Bld: 116 mg/dL — ABNORMAL HIGH (ref 65–99)
Glucose, Bld: 96 mg/dL (ref 65–99)
POTASSIUM: 4 mmol/L (ref 3.5–5.1)
POTASSIUM: 4.4 mmol/L (ref 3.5–5.1)
SODIUM: 133 mmol/L — AB (ref 135–145)
SODIUM: 135 mmol/L (ref 135–145)

## 2016-05-18 LAB — CBC
HCT: 30 % — ABNORMAL LOW (ref 35.0–47.0)
Hemoglobin: 10.2 g/dL — ABNORMAL LOW (ref 12.0–16.0)
MCH: 29.7 pg (ref 26.0–34.0)
MCHC: 34 g/dL (ref 32.0–36.0)
MCV: 87.3 fL (ref 80.0–100.0)
Platelets: 285 10*3/uL (ref 150–440)
RBC: 3.44 MIL/uL — AB (ref 3.80–5.20)
RDW: 14.6 % — ABNORMAL HIGH (ref 11.5–14.5)
WBC: 11.7 10*3/uL — AB (ref 3.6–11.0)

## 2016-05-18 LAB — TROPONIN I

## 2016-05-18 MED ORDER — ONDANSETRON HCL 4 MG/2ML IJ SOLN: 4.0000 mg | Freq: Four times a day (QID) | INTRAMUSCULAR | Status: DC | PRN

## 2016-05-18 MED ORDER — LIDOCAINE HCL (PF) 1 % IJ SOLN
INTRAMUSCULAR | Status: AC
  Filled 2016-05-18: qty 5

## 2016-05-18 MED ORDER — HYDROCODONE-ACETAMINOPHEN 5-325 MG PO TABS: 1.0000 | ORAL_TABLET | ORAL | Status: DC | PRN

## 2016-05-18 MED ORDER — FLUTICASONE PROPIONATE 50 MCG/ACT NA SUSP
1.0000 | Freq: Every day | NASAL | Status: DC
  Administered 2016-05-18: 1 via NASAL
  Filled 2016-05-18: qty 16

## 2016-05-18 MED ORDER — MORPHINE SULFATE (PF) 2 MG/ML IV SOLN
2.0000 mg | INTRAVENOUS | Status: DC | PRN
  Filled 2016-05-18: qty 1

## 2016-05-18 MED ORDER — ACETAMINOPHEN 325 MG PO TABS: 650.0000 mg | ORAL_TABLET | Freq: Four times a day (QID) | ORAL | Status: DC | PRN

## 2016-05-18 MED ORDER — CITALOPRAM HYDROBROMIDE 20 MG PO TABS
10.0000 mg | ORAL_TABLET | Freq: Every day | ORAL | Status: DC
  Administered 2016-05-18: 22:00:00 10 mg via ORAL
  Filled 2016-05-18: qty 1

## 2016-05-18 MED ORDER — SODIUM CHLORIDE 0.9 % IV SOLN
INTRAVENOUS | Status: DC
  Administered 2016-05-18 – 2016-05-19 (×3): via INTRAVENOUS

## 2016-05-18 MED ORDER — SODIUM CHLORIDE 0.9 % IV BOLUS (SEPSIS)
1000.0000 mL | Freq: Once | INTRAVENOUS | Status: AC
  Administered 2016-05-18: 1000 mL via INTRAVENOUS

## 2016-05-18 MED ORDER — KETOROLAC TROMETHAMINE 30 MG/ML IJ SOLN
30.0000 mg | Freq: Once | INTRAMUSCULAR | Status: AC
  Administered 2016-05-18: 30 mg via INTRAVENOUS
  Filled 2016-05-18: qty 1

## 2016-05-18 MED ORDER — LIDOCAINE-EPINEPHRINE (PF) 1 %-1:200000 IJ SOLN
INTRAMUSCULAR | Status: AC
  Filled 2016-05-18: qty 30

## 2016-05-18 MED ORDER — FESOTERODINE FUMARATE ER 4 MG PO TB24
4.0000 mg | ORAL_TABLET | Freq: Every day | ORAL | Status: DC
  Administered 2016-05-18: 4 mg via ORAL
  Filled 2016-05-18 (×2): qty 1

## 2016-05-18 MED ORDER — HEPARIN SODIUM (PORCINE) 5000 UNIT/ML IJ SOLN
5000.0000 [IU] | Freq: Three times a day (TID) | INTRAMUSCULAR | Status: DC
  Administered 2016-05-18 – 2016-05-19 (×3): 5000 [IU] via SUBCUTANEOUS
  Filled 2016-05-18 (×3): qty 1

## 2016-05-18 MED ORDER — ACETAMINOPHEN 650 MG RE SUPP: 650.0000 mg | Freq: Four times a day (QID) | RECTAL | Status: DC | PRN

## 2016-05-18 MED ORDER — ONDANSETRON HCL 4 MG PO TABS: 4.0000 mg | ORAL_TABLET | Freq: Four times a day (QID) | ORAL | Status: DC | PRN

## 2016-05-18 MED ORDER — DOCUSATE SODIUM 100 MG PO CAPS
100.0000 mg | ORAL_CAPSULE | Freq: Two times a day (BID) | ORAL | Status: DC
  Administered 2016-05-18: 22:00:00 100 mg via ORAL
  Filled 2016-05-18 (×2): qty 1

## 2016-05-18 NOTE — H&P (Addendum)
Shelby Douglas NAME: Shelby Douglas    MR#:  [id number]  DATE OF BIRTH:  [birth year]  DATE OF ADMISSION:  05/17/2016  PRIMARY CARE PHYSICIAN: SPARKS,JEFFREY D, MD   REQUESTING/REFERRING PHYSICIAN:   CHIEF COMPLAINT:   Chief Complaint  Patient presents with  . Fall    HISTORY OF PRESENT ILLNESS: Shelby Douglas  is a 65 y.o. female with a known history of Anxiety disorder, hypertension, hyperlipidemia, lumbar disc disease presented to the emergency room for fall. Patient needs either away she lost balance and fell down and has an abrasion over the left eyebrow which was sutured in the emergency room by ER physician. Patient had a Mohs surgery or the left cheek 2 days ago at outpatient dermatology clinic. Patient was hypotensive and she presented to the emergency room she was received IV fluid bolus in the emergency room. No history of any fever or chills. Felt dizzy but no complaints of any chest pain. No history of any syncope or seizure. Patient's renal function was also elevated during the workup in the emergency room. She was worked up with a CT head which showed no acute intracranial abnormality on CT cervical spine did not show any fracture. Hospitalist service was consulted for further care of the patient. No history of any shortness of breath. No history of any orthopnea or proximal nocturnal dyspnea.  PAST MEDICAL HISTORY:   Past Medical History:  Diagnosis Date  . Anxiety   . H/O dilation and curettage   . H/O tubal ligation   . Hyperlipidemia   . Hypertension   . Lumbar disc disease     PAST SURGICAL HISTORY: Past Surgical History:  Procedure Laterality Date  . ABDOMINAL HYSTERECTOMY    . BACK SURGERY    . CESAREAN SECTION    . COLONOSCOPY WITH PROPOFOL N/A 04/18/2015   Procedure: COLONOSCOPY WITH PROPOFOL;  Surgeon: Josefine Class, MD;  Location: [facility];  Service: Endoscopy;  Laterality: N/A;  . DILATION  AND CURETTAGE OF UTERUS    . TUBAL LIGATION      SOCIAL HISTORY:  Social History  Substance Use Topics  . Smoking status: Never Smoker  . Smokeless tobacco: Never Used  . Alcohol use Yes     Comment: monthly    FAMILY HISTORY:  Family History  Problem Relation Age of Onset  . Diabetes Mother   . Heart disease Mother   . Diabetes Father   . Heart disease Father   . Diabetes Maternal Grandmother   . Heart disease Maternal Grandmother   . Diabetes Maternal Grandfather   . Heart disease Maternal Grandfather   . Breast cancer Paternal Grandmother   . Uterine cancer Paternal Grandmother   . Diabetes Paternal Grandmother   . Heart disease Paternal Grandmother   . Diabetes Paternal Grandfather   . Heart disease Paternal Grandfather   . Ovarian cancer Neg Hx   . Colon cancer Neg Hx     DRUG ALLERGIES:  Allergies  Allergen Reactions  . Latex     REVIEW OF SYSTEMS:   CONSTITUTIONAL: No fever, has weakness.  EYES: No blurred or double vision.  Abrasion over left eye brow EARS, NOSE, AND THROAT: No tinnitus or ear pain.  RESPIRATORY: No cough, shortness of breath, wheezing or hemoptysis.  CARDIOVASCULAR: No chest pain, orthopnea, edema.  GASTROINTESTINAL: No nausea, vomiting, diarrhea or abdominal pain.  GENITOURINARY: No dysuria, hematuria.  ENDOCRINE: No polyuria, nocturia,  HEMATOLOGY: No  anemia, easy bruising or bleeding SKIN: No rash or lesion. MUSCULOSKELETAL: No joint pain or arthritis.   NEUROLOGIC: No tingling, numbness, weakness.  Has dizziness PSYCHIATRY: No anxiety or depression.   MEDICATIONS AT HOME:  Prior to Admission medications   Medication Sig Start Date End Date Taking? Authorizing Provider  citalopram (CELEXA) 10 MG tablet Take 10 mg by mouth daily.   Yes Historical Provider, MD  cyclobenzaprine (FLEXERIL) 10 MG tablet Take 10 mg by mouth at bedtime as needed for muscle spasms.   Yes Historical Provider, MD  estradiol (ESTRACE VAGINAL) 0.1 MG/GM  vaginal cream 1/2 g twice a week intravaginal 03/06/16  Yes Alanda Slim Defrancesco, MD  fluticasone (FLONASE) 50 MCG/ACT nasal spray Place into both nostrils daily.   Yes Historical Provider, MD  gabapentin (NEURONTIN) 300 MG capsule Take 300 mg by mouth 3 (three) times daily.   Yes Historical Provider, MD  lisinopril-hydrochlorothiazide (PRINZIDE,ZESTORETIC) 20-12.5 MG per tablet Take 1 tablet by mouth daily.   Yes Historical Provider, MD  meloxicam (MOBIC) 15 MG tablet Take 15 mg by mouth daily.   Yes Historical Provider, MD  tolterodine (DETROL LA) 4 MG 24 hr capsule Take 1 capsule (4 mg total) by mouth Nightly. 03/06/16  Yes Alanda Slim Defrancesco, MD  traMADol (ULTRAM) 50 MG tablet Take by mouth 2 (two) times daily.   Yes Historical Provider, MD      PHYSICAL EXAMINATION:   VITAL SIGNS: Blood pressure (!) 95/58, pulse 83, temperature 98.2 F (36.8 C), temperature source Oral, resp. rate 13, height 5' (1.524 m), weight 71.2 kg (157 lb), SpO2 93 %.  GENERAL:  65 y.o.-year-old patient lying in the bed with no acute distress.  EYES: Pupils equal, round, reactive to light and accommodation. No scleral icterus. Extraocular muscles intact.  HEENT: Head atraumatic, normocephalic. Oropharynx and nasopharynx clear.  Abrasion over left eye brow Mohs surgery over right cheek area NECK:  Supple, no jugular venous distention. No thyroid enlargement, no tenderness.  LUNGS: Normal breath sounds bilaterally, no wheezing, rales,rhonchi or crepitation. No use of accessory muscles of respiration.  CARDIOVASCULAR: S1, S2 normal. No murmurs, rubs, or gallops.  ABDOMEN: Soft, nontender, nondistended. Bowel sounds present. No organomegaly or mass.  EXTREMITIES: No pedal edema, cyanosis, or clubbing.  NEUROLOGIC: Cranial nerves II through XII are intact. Muscle strength 5/5 in all extremities. Sensation intact. Gait not checked.  PSYCHIATRIC: The patient is alert and oriented x 3.  SKIN: No obvious rash, lesion, or  ulcer.   LABORATORY PANEL:   CBC  Recent Labs Lab 05/17/16 2345  WBC 11.7*  HGB 10.2*  HCT 30.0*  PLT 285  MCV 87.3  MCH 29.7  MCHC 34.0  RDW 14.6*   ------------------------------------------------------------------------------------------------------------------  Chemistries   Recent Labs Lab 05/17/16 2345 05/18/16 0332  NA 133* 135  K 4.0 4.4  CL 102 107  CO2 22 24  GLUCOSE 116* 96  BUN 44* 42*  CREATININE 2.54* 2.27*  CALCIUM 8.2* 7.6*   ------------------------------------------------------------------------------------------------------------------ estimated creatinine clearance is 22.1 mL/min (by C-G formula based on SCr of 2.27 mg/dL (H)). ------------------------------------------------------------------------------------------------------------------ No results for input(s): TSH, T4TOTAL, T3FREE, THYROIDAB in the last 72 hours.  Invalid input(s): FREET3   Coagulation profile No results for input(s): INR, PROTIME in the last 168 hours. ------------------------------------------------------------------------------------------------------------------- No results for input(s): DDIMER in the last 72 hours. -------------------------------------------------------------------------------------------------------------------  Cardiac Enzymes  Recent Labs Lab 05/17/16 2345  TROPONINI <0.03   ------------------------------------------------------------------------------------------------------------------ Invalid input(s): POCBNP  ---------------------------------------------------------------------------------------------------------------  Urinalysis    Component Value  Date/Time   APPEARANCEUR Clear 03/26/2016 1337   GLUCOSEU Negative 03/26/2016 1337   BILIRUBINUR Negative 03/26/2016 1337   PROTEINUR Trace (A) 03/26/2016 1337   NITRITE Negative 03/26/2016 1337   LEUKOCYTESUR Trace (A) 03/26/2016 1337     RADIOLOGY: Ct Head Wo  Contrast  Result Date: 05/18/2016 CLINICAL DATA:  Leg gave fell, fell hitting corner of bed. Generalized headache. LEFT eyelid laceration. Hypotensive. Cervical spine tenderness. History of hypertension. EXAM: CT HEAD WITHOUT CONTRAST CT CERVICAL SPINE WITHOUT CONTRAST TECHNIQUE: Multidetector CT imaging of the head and cervical spine was performed following the standard protocol without intravenous contrast. Multiplanar CT image reconstructions of the cervical spine were also generated. COMPARISON:  None. FINDINGS: CT HEAD FINDINGS BRAIN: The ventricles and sulci are normal. No intraparenchymal hemorrhage, mass effect nor midline shift. No acute large vascular territory infarcts. No abnormal extra-axial fluid collections. Basal cisterns are patent. 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 VASCULAR: Trace calcific atherosclerosis of the carotid siphons. SKULL/SOFT TISSUES: No skull fracture. Small LEFT periorbital scalp hematoma. No subcutaneous gas or radiopaque foreign bodies. ORBITS/SINUSES: Preservation of the orbital fat. Mildly elongated ocular globes associated myopia or increased intra-ocular pressure. The mastoid air-cells and included paranasal sinuses are well-aerated. OTHER: None. CT CERVICAL SPINE FINDINGS ALIGNMENT: Minimal grade 1 C3-4 anterolisthesis. Maintained lordosis. SKULL BASE AND VERTEBRAE: Cervical vertebral bodies and posterior elements are intact. Moderate C4-5, mild C5-6 disc height loss with uncovertebral hypertrophy. Severe LEFT C2-3 and C3-4 facet arthropathy and mild facet widening most consistent with effusion and periarticular regularity. C1-2 articulation maintained with moderate arthropathy. No destructive bony lesions. SOFT TISSUES AND SPINAL CANAL: Normal. DISC LEVELS: No significant osseous canal stenosis. Moderate to severe LEFT C2-3 and LEFT C3-4, RIGHT C4-5 neural foraminal narrowing. UPPER CHEST: Lung apices are clear. OTHER: None.  IMPRESSION: CT HEAD: Small LEFT periorbital scalp hematoma without postseptal extent. No acute intracranial process. **An incidental finding of potential clinical significance has been found. 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 Minimal grade 1 C3-4 anterolisthesis associated with severe LEFT facet arthropathy and chronic inflammatory changes. Electronically Signed   By: Elon Alas M.D.   On: 05/18/2016 00:20   Ct Cervical Spine Wo Contrast  Result Date: 05/18/2016 CLINICAL DATA:  Leg gave fell, fell hitting corner of bed. Generalized headache. LEFT eyelid laceration. Hypotensive. Cervical spine tenderness. History of hypertension. EXAM: CT HEAD WITHOUT CONTRAST CT CERVICAL SPINE WITHOUT CONTRAST TECHNIQUE: Multidetector CT imaging of the head and cervical spine was performed following the standard protocol without intravenous contrast. Multiplanar CT image reconstructions of the cervical spine were also generated. COMPARISON:  None. FINDINGS: CT HEAD FINDINGS BRAIN: The ventricles and sulci are normal. No intraparenchymal hemorrhage, mass effect nor midline shift. No acute large vascular territory infarcts. No abnormal extra-axial fluid collections. Basal cisterns are patent. 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 VASCULAR: Trace calcific atherosclerosis of the carotid siphons. SKULL/SOFT TISSUES: No skull fracture. Small LEFT periorbital scalp hematoma. No subcutaneous gas or radiopaque foreign bodies. ORBITS/SINUSES: Preservation of the orbital fat. Mildly elongated ocular globes associated myopia or increased intra-ocular pressure. The mastoid air-cells and included paranasal sinuses are well-aerated. OTHER: None. CT CERVICAL SPINE FINDINGS ALIGNMENT: Minimal grade 1 C3-4 anterolisthesis. Maintained lordosis. SKULL BASE AND VERTEBRAE: Cervical vertebral bodies and  posterior elements are intact. Moderate C4-5, mild  C5-6 disc height loss with uncovertebral hypertrophy. Severe LEFT C2-3 and C3-4 facet arthropathy and mild facet widening most consistent with effusion and periarticular regularity. C1-2 articulation maintained with moderate arthropathy. No destructive bony lesions. SOFT TISSUES AND SPINAL CANAL: Normal. DISC LEVELS: No significant osseous canal stenosis. Moderate to severe LEFT C2-3 and LEFT C3-4, RIGHT C4-5 neural foraminal narrowing. UPPER CHEST: Lung apices are clear. OTHER: None. IMPRESSION: CT HEAD: Small LEFT periorbital scalp hematoma without postseptal extent. No acute intracranial process. **An incidental finding of potential clinical significance has been found. 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 Minimal grade 1 C3-4 anterolisthesis associated with severe LEFT facet arthropathy and chronic inflammatory changes. Electronically Signed   By: Elon Alas M.D.   On: 05/18/2016 00:20    EKG: Orders placed or performed during the hospital encounter of 05/17/16  . ED EKG  . ED EKG  . EKG 12-Lead  . EKG 12-Lead  . EKG 12-Lead  . EKG 12-Lead    IMPRESSION AND PLAN: 65 year old female patient with history of hypertension, hyperlipidemia, lumbar disc disease, anxiety disorder presented to the emergency room with the dizziness and fall. CT scan of the head showed suprasellar mass which was found as an incidental finding. Admitting diagnosis 1. Hypotension 2. Acute renal failure 3. Dizziness 4. Accidental fall 5. Mild hyponatremia 6. 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 Supportive  care.  All the records are reviewed and case discussed with ED provider. Management plans discussed with the patient, family and they are in agreement.  CODE STATUS:FULL Code Status History    This patient does not have a recorded code status. Please follow your organizational policy for patients in this situation.       TOTAL TIME TAKING CARE OF THIS PATIENT: 55 minutes.    Saundra Shelling M.D on 05/18/2016 at 6:09 AM  Between 7am to 6pm - Pager - [phone]  After 6pm go to [email or web address] - password [email or web address] [facility]  Office  [phone]  CC: Primary care physician; Idelle Crouch, MD

## 2016-05-18 NOTE — ED Notes (Signed)
Informed RN bed ready 

## 2016-05-18 NOTE — ED Notes (Signed)
Pt has 8 stiches to wound above left eye. Clean and dry at this time

## 2016-05-18 NOTE — ED Notes (Signed)
Pt up to bathroom with one assist. Denies any dizziness

## 2016-05-18 NOTE — ED Notes (Signed)
Lac cart at bedside  ?

## 2016-05-18 NOTE — Progress Notes (Signed)
Shelby Douglas at [place] NAME: Shelby Douglas    MR#:  [id number]  DATE OF BIRTH:  Mar 18, 1951  SUBJECTIVE: Admitted for fall, hypotension, acute renal failure   CHIEF COMPLAINT:   Chief Complaint  Patient presents with  . Fall    REVIEW OF SYSTEMS:   ROS CONSTITUTIONAL: No fever, fatigue or weakness.  EYES: No blurred or double vision.  EARS, NOSE, AND THROAT: No tinnitus or ear pain.  RESPIRATORY: No cough, shortness of breath, wheezing or hemoptysis.  CARDIOVASCULAR: No chest pain, orthopnea, edema.  GASTROINTESTINAL: No nausea, vomiting, diarrhea or abdominal pain.  GENITOURINARY: No dysuria, hematuria.  ENDOCRINE: No polyuria, nocturia,  HEMATOLOGY: No anemia, easy bruising or bleeding SKIN: No rash or lesion. MUSCULOSKELETAL: No joint pain or arthritis.   NEUROLOGIC: No tingling, numbness, weakness.  PSYCHIATRY: No anxiety or depression.   DRUG ALLERGIES:   Allergies  Allergen Reactions  . Latex Rash    VITALS:  Blood pressure (!) 97/51, pulse 74, temperature 98.2 F (36.8 C), temperature source Oral, resp. rate 20, height 5\' 3"  (1.6 m), weight 77.9 kg (171 lb 11.2 oz), SpO2 99 %.  PHYSICAL EXAMINATION:  GENERAL:  65 y.o.-year-old patient lying in the bed with no acute distress.  EYES: Pupils equal, round, reactive to light and accommodation. No scleral icterus. Extraocular muscles intact.  HEENT: Head atraumatic, normocephalic. Oropharynx and nasopharynx clear.  NECK:  Supple, no jugular venous distention. No thyroid enlargement, no tenderness.  LUNGS: Normal breath sounds bilaterally, no wheezing, rales,rhonchi or crepitation. No use of accessory muscles of respiration.  CARDIOVASCULAR: S1, S2 normal. No murmurs, rubs, or gallops.  ABDOMEN: Soft, nontender, nondistended. Bowel sounds present. No organomegaly or mass.  EXTREMITIES: No pedal edema, cyanosis, or clubbing.  NEUROLOGIC: Cranial nerves II through XII are  intact. Muscle strength 5/5 in all extremities. Sensation intact. Gait not checked.  PSYCHIATRIC: The patient is alert and oriented x 3.  SKIN: No obvious rash, lesion, or ulcer.    LABORATORY PANEL:   CBC  Recent Labs Lab 05/17/16 2345  WBC 11.7*  HGB 10.2*  HCT 30.0*  PLT 285   ------------------------------------------------------------------------------------------------------------------  Chemistries   Recent Labs Lab 05/18/16 0332  NA 135  K 4.4  CL 107  CO2 24  GLUCOSE 96  BUN 42*  CREATININE 2.27*  CALCIUM 7.6*   ------------------------------------------------------------------------------------------------------------------  Cardiac Enzymes  Recent Labs Lab 05/17/16 2345  TROPONINI <0.03   ------------------------------------------------------------------------------------------------------------------  RADIOLOGY:  Ct Head Wo Contrast  Result Date: 05/18/2016 CLINICAL DATA:  Leg gave fell, fell hitting corner of bed. Generalized headache. LEFT eyelid laceration. Hypotensive. Cervical spine tenderness. History of hypertension. EXAM: CT HEAD WITHOUT CONTRAST CT CERVICAL SPINE WITHOUT CONTRAST TECHNIQUE: Multidetector CT imaging of the head and cervical spine was performed following the standard protocol without intravenous contrast. Multiplanar CT image reconstructions of the cervical spine were also generated. COMPARISON:  None. FINDINGS: CT HEAD FINDINGS BRAIN: The ventricles and sulci are normal. No intraparenchymal hemorrhage, mass effect nor midline shift. No acute large vascular territory infarcts. No abnormal extra-axial fluid collections. Basal cisterns are patent. 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 VASCULAR: Trace calcific atherosclerosis of the carotid siphons. SKULL/SOFT TISSUES: No skull fracture. Small LEFT periorbital scalp hematoma. No subcutaneous gas or radiopaque foreign bodies. ORBITS/SINUSES:  Preservation of the orbital fat. Mildly elongated ocular globes associated myopia or increased intra-ocular pressure. The mastoid air-cells and included paranasal sinuses are well-aerated.  OTHER: None. CT CERVICAL SPINE FINDINGS ALIGNMENT: Minimal grade 1 C3-4 anterolisthesis. Maintained lordosis. SKULL BASE AND VERTEBRAE: Cervical vertebral bodies and posterior elements are intact. Moderate C4-5, mild C5-6 disc height loss with uncovertebral hypertrophy. Severe LEFT C2-3 and C3-4 facet arthropathy and mild facet widening most consistent with effusion and periarticular regularity. C1-2 articulation maintained with moderate arthropathy. No destructive bony lesions. SOFT TISSUES AND SPINAL CANAL: Normal. DISC LEVELS: No significant osseous canal stenosis. Moderate to severe LEFT C2-3 and LEFT C3-4, RIGHT C4-5 neural foraminal narrowing. UPPER CHEST: Lung apices are clear. OTHER: None. IMPRESSION: CT HEAD: Small LEFT periorbital scalp hematoma without postseptal extent. No acute intracranial process. **An incidental finding of potential clinical significance has been found. 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 Minimal grade 1 C3-4 anterolisthesis associated with severe LEFT facet arthropathy and chronic inflammatory changes. Electronically Signed   By: Elon Alas M.D.   On: 05/18/2016 00:20   Ct Cervical Spine Wo Contrast  Result Date: 05/18/2016 CLINICAL DATA:  Leg gave fell, fell hitting corner of bed. Generalized headache. LEFT eyelid laceration. Hypotensive. Cervical spine tenderness. History of hypertension. EXAM: CT HEAD WITHOUT CONTRAST CT CERVICAL SPINE WITHOUT CONTRAST TECHNIQUE: Multidetector CT imaging of the head and cervical spine was performed following the standard protocol without intravenous contrast. Multiplanar CT image reconstructions of the cervical spine were also generated. COMPARISON:   None. FINDINGS: CT HEAD FINDINGS BRAIN: The ventricles and sulci are normal. No intraparenchymal hemorrhage, mass effect nor midline shift. No acute large vascular territory infarcts. No abnormal extra-axial fluid collections. Basal cisterns are patent. 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 VASCULAR: Trace calcific atherosclerosis of the carotid siphons. SKULL/SOFT TISSUES: No skull fracture. Small LEFT periorbital scalp hematoma. No subcutaneous gas or radiopaque foreign bodies. ORBITS/SINUSES: Preservation of the orbital fat. Mildly elongated ocular globes associated myopia or increased intra-ocular pressure. The mastoid air-cells and included paranasal sinuses are well-aerated. OTHER: None. CT CERVICAL SPINE FINDINGS ALIGNMENT: Minimal grade 1 C3-4 anterolisthesis. Maintained lordosis. SKULL BASE AND VERTEBRAE: Cervical vertebral bodies and posterior elements are intact. Moderate C4-5, mild C5-6 disc height loss with uncovertebral hypertrophy. Severe LEFT C2-3 and C3-4 facet arthropathy and mild facet widening most consistent with effusion and periarticular regularity. C1-2 articulation maintained with moderate arthropathy. No destructive bony lesions. SOFT TISSUES AND SPINAL CANAL: Normal. DISC LEVELS: No significant osseous canal stenosis. Moderate to severe LEFT C2-3 and LEFT C3-4, RIGHT C4-5 neural foraminal narrowing. UPPER CHEST: Lung apices are clear. OTHER: None. IMPRESSION: CT HEAD: Small LEFT periorbital scalp hematoma without postseptal extent. No acute intracranial process. **An incidental finding of potential clinical significance has been found. 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 Minimal grade 1 C3-4 anterolisthesis associated with severe LEFT facet arthropathy and chronic inflammatory changes. Electronically Signed   By: Elon Alas M.D.   On: 05/18/2016  00:20    EKG:   Orders placed or performed during the hospital encounter of 05/17/16  . ED EKG  . ED EKG  . EKG 12-Lead  . EKG 12-Lead  . EKG 12-Lead  . EKG 12-Lead    ASSESSMENT AND PLAN:   #70. 65 year old female patient with anxiety disorder, hypertension, hyperlipidemia and sent because of the fall, admitted for acute renal failure: Patient  started on IV hydration. Check the renal ultrasound.  Normal renal function recently. Acute renal failure likely ATN due to hypotension: continue  IV hydration, avoid ACE inhibitors, HCTZ.  #2 history of anxiety #3 hyperlipidemia #4 chronic back pain GI prophylaxis, DVT prophylaxis   All the records are reviewed and case discussed with Care Management/Social Workerr. Management plans discussed with the patient, family and they are in agreement.  CODE STATUS:full  TOTAL TIME TAKING CARE OF THIS PATIENT: 18minutes.   POSSIBLE D/C IN 1-2DAYS, DEPENDING ON CLINICAL CONDITION.   Epifanio Lesches M.D on 05/18/2016 at 9:31 AM  Between 7am to 6pm - Pager - [phone]  After 6pm go to [email or web address] - password [email or web address] [facility]  Office  [phone]  CC: Primary care physician; Idelle Crouch, MD   Note: This dictation was prepared with Dragon dictation along with smaller phrase technology. Any transcriptional errors that result from this process are unintentional.

## 2016-05-18 NOTE — ED Notes (Signed)
Pt returned from CT °

## 2016-05-19 LAB — CBC
HEMATOCRIT: 31.3 % — AB (ref 35.0–47.0)
Hemoglobin: 10.6 g/dL — ABNORMAL LOW (ref 12.0–16.0)
MCH: 29.9 pg (ref 26.0–34.0)
MCHC: 33.9 g/dL (ref 32.0–36.0)
MCV: 88.1 fL (ref 80.0–100.0)
PLATELETS: 273 10*3/uL (ref 150–440)
RBC: 3.55 MIL/uL — ABNORMAL LOW (ref 3.80–5.20)
RDW: 14.7 % — AB (ref 11.5–14.5)
WBC: 6 10*3/uL (ref 3.6–11.0)

## 2016-05-19 LAB — BASIC METABOLIC PANEL
Anion gap: 4 — ABNORMAL LOW (ref 5–15)
BUN: 23 mg/dL — AB (ref 6–20)
CHLORIDE: 114 mmol/L — AB (ref 101–111)
CO2: 22 mmol/L (ref 22–32)
CREATININE: 1.29 mg/dL — AB (ref 0.44–1.00)
Calcium: 8.4 mg/dL — ABNORMAL LOW (ref 8.9–10.3)
GFR calc Af Amer: 50 mL/min — ABNORMAL LOW (ref >60)
GFR calc non Af Amer: 43 mL/min — ABNORMAL LOW (ref >60)
GLUCOSE: 83 mg/dL (ref 65–99)
Potassium: 4.3 mmol/L (ref 3.5–5.1)
SODIUM: 140 mmol/L (ref 135–145)

## 2016-05-19 NOTE — Discharge Summary (Signed)
Shelby Douglas, is a 65 y.o. female  DOB 1951-02-11  MRN [id number].  Admission date:  05/17/2016  Admitting Physician  Saundra Shelling, MD  Discharge Date:  05/19/2016   Primary MD  SPARKS,JEFFREY D, MD  Recommendations for primary care physician for things to follow:    follow up with Primary doctor in 1 week   Admission Diagnosis  Renal failure [N19]   Discharge Diagnosis  Renal failure [N19]    Principal Problem:   Hypotension Active Problems:   Renal failure      Past Medical History:  Diagnosis Date  . Anxiety   . H/O dilation and curettage   . H/O tubal ligation   . Hyperlipidemia   . Hypertension   . Lumbar disc disease     Past Surgical History:  Procedure Laterality Date  . ABDOMINAL HYSTERECTOMY    . BACK SURGERY    . CESAREAN SECTION    . COLONOSCOPY WITH PROPOFOL N/A 04/18/2015   Procedure: COLONOSCOPY WITH PROPOFOL;  Surgeon: Josefine Class, MD;  Location: [facility] ENDOSCOPY;  Service: Endoscopy;  Laterality: N/A;  . DILATION AND CURETTAGE OF UTERUS    . TUBAL LIGATION         History of present illness and  Hospital Course:     Kindly see H&P for history of present illness and admission details, please review complete Labs, Consult reports and Test reports for all details in brief  HPI  from the history and physical done on the day of admission 65 year old female patient admitted for fall, found to have acute renal failure. Received IV hydration.   Hospital Course  Fall and very to hypotension with acute renal failure: Admitted to medical service, started on IV hydration.  Stopped lisinopril, /hctz  combination that she was taking at home for blood pressure., Patient renal function improved, patient creatinine 2.54 on admission, 1.29 today. #2 acute renal failure with ATN due  to hypotension: Improved with titration. Advised the patient to stop  bp meds till  Sunday and resume on monday. Oct 2 nd. BP improved to 120/61 today. #3 chronic back pain: Patient says that she took 5 or tramadol tablets before p.m. she was found with fall at home. Advised the patient not to overdose on medication. She says she had a pain lot of pain in the back and took 5 tramadol tablets ,that could be also one of the reason for fall. Marland Kitchen   Discharge Condition:   Follow UP  Follow-up Information    SPARKS,JEFFREY D, MD Follow up in 1 week(s).   Specialty:  Internal Medicine Contact information: [place] [address] [phone]             Discharge Instructions  and  Discharge Medications     Discharge Instructions    Discharge instructions    Complete by:  As directed    Resume home bp med  Lisinopril/hctz on Monday ,but take during day time D/w PMD       Medication List    STOP taking these medications   lisinopril-hydrochlorothiazide 20-12.5 MG tablet Commonly known as:  PRINZIDE,ZESTORETIC     TAKE these medications   citalopram 10 MG tablet Commonly known as:  CELEXA Take 10 mg by mouth daily.   cyclobenzaprine 10 MG tablet Commonly known as:  FLEXERIL Take 10 mg by mouth at bedtime as needed for muscle spasms.   estradiol 0.1 MG/GM vaginal cream Commonly known as:  ESTRACE VAGINAL 1/2 g twice a week intravaginal   fluticasone 50 MCG/ACT nasal spray Commonly known as:  FLONASE Place into both nostrils daily.   gabapentin 300 MG capsule Commonly known as:  NEURONTIN Take 300 mg by mouth 3 (three) times daily.   meloxicam 15 MG tablet Commonly known as:  MOBIC Take 15 mg by mouth daily.   tolterodine 4 MG 24 hr capsule Commonly known as:  DETROL LA Take 1 capsule (4 mg total) by mouth Nightly.   traMADol 50 MG tablet Commonly known as:  ULTRAM Take by mouth 2 (two) times daily.          Diet and Activity recommendation: See Discharge Instructions above   Consults obtained - none   Major procedures and Radiology Reports - PLEASE review detailed and final reports for all details, in brief -     Ct Head Wo Contrast  Result Date: 05/18/2016 CLINICAL DATA:  Leg gave fell, fell hitting corner of bed. Generalized headache. LEFT eyelid laceration. Hypotensive. Cervical spine tenderness. History of hypertension. EXAM: CT HEAD WITHOUT CONTRAST CT CERVICAL SPINE WITHOUT CONTRAST TECHNIQUE: Multidetector CT imaging of the head and cervical spine was performed following the standard protocol without intravenous contrast. Multiplanar CT image reconstructions of the cervical spine were also generated. COMPARISON:  None. FINDINGS: CT HEAD FINDINGS BRAIN: The ventricles and sulci are normal. No intraparenchymal hemorrhage, mass effect nor midline shift. No acute large vascular territory infarcts. No abnormal extra-axial fluid collections. Basal cisterns are patent. 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 VASCULAR: Trace calcific atherosclerosis of the carotid siphons. SKULL/SOFT TISSUES: No skull fracture. Small LEFT periorbital scalp hematoma. No subcutaneous gas or radiopaque foreign bodies. ORBITS/SINUSES: Preservation of the orbital fat. Mildly elongated ocular globes associated myopia or increased intra-ocular pressure. The mastoid air-cells and included paranasal sinuses are well-aerated. OTHER: None. CT CERVICAL SPINE FINDINGS ALIGNMENT: Minimal grade 1 C3-4 anterolisthesis. Maintained lordosis. SKULL BASE AND VERTEBRAE: Cervical vertebral bodies and posterior elements are intact. Moderate C4-5, mild C5-6 disc height loss with uncovertebral hypertrophy. Severe LEFT C2-3 and C3-4 facet arthropathy and mild facet widening most consistent with effusion and periarticular regularity. C1-2 articulation maintained with moderate arthropathy. No destructive bony  lesions. SOFT TISSUES AND SPINAL CANAL: Normal. DISC LEVELS: No significant osseous canal stenosis. Moderate to severe LEFT C2-3 and LEFT C3-4, RIGHT C4-5 neural foraminal narrowing. UPPER CHEST: Lung apices are clear. OTHER: None. IMPRESSION: CT HEAD: Small LEFT periorbital scalp hematoma without postseptal extent. No acute intracranial process. **An incidental finding of potential clinical significance has been found. 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 Minimal grade 1 C3-4 anterolisthesis associated with severe LEFT facet arthropathy and chronic inflammatory changes. Electronically Signed   By: Elon Alas M.D.   On: 05/18/2016 00:20   Ct Cervical Spine Wo Contrast  Result Date: 05/18/2016 CLINICAL DATA:  Leg gave fell, fell hitting corner of bed. Generalized headache. LEFT eyelid laceration. Hypotensive. Cervical spine tenderness. History of hypertension. EXAM: CT HEAD WITHOUT CONTRAST CT CERVICAL SPINE WITHOUT CONTRAST TECHNIQUE: Multidetector CT imaging of the head and cervical spine was performed following the standard protocol without intravenous contrast. Multiplanar CT image reconstructions of the cervical spine were also generated. COMPARISON:  None. FINDINGS: CT HEAD FINDINGS BRAIN: The ventricles and sulci are normal. No intraparenchymal hemorrhage, mass effect nor midline shift. No acute large vascular  territory infarcts. No abnormal extra-axial fluid collections. Basal cisterns are patent. 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 VASCULAR: Trace calcific atherosclerosis of the carotid siphons. SKULL/SOFT TISSUES: No skull fracture. Small LEFT periorbital scalp hematoma. No subcutaneous gas or radiopaque foreign bodies. ORBITS/SINUSES: Preservation of the orbital fat. Mildly elongated ocular globes associated myopia or increased intra-ocular pressure. The  mastoid air-cells and included paranasal sinuses are well-aerated. OTHER: None. CT CERVICAL SPINE FINDINGS ALIGNMENT: Minimal grade 1 C3-4 anterolisthesis. Maintained lordosis. SKULL BASE AND VERTEBRAE: Cervical vertebral bodies and posterior elements are intact. Moderate C4-5, mild C5-6 disc height loss with uncovertebral hypertrophy. Severe LEFT C2-3 and C3-4 facet arthropathy and mild facet widening most consistent with effusion and periarticular regularity. C1-2 articulation maintained with moderate arthropathy. No destructive bony lesions. SOFT TISSUES AND SPINAL CANAL: Normal. DISC LEVELS: No significant osseous canal stenosis. Moderate to severe LEFT C2-3 and LEFT C3-4, RIGHT C4-5 neural foraminal narrowing. UPPER CHEST: Lung apices are clear. OTHER: None. IMPRESSION: CT HEAD: Small LEFT periorbital scalp hematoma without postseptal extent. No acute intracranial process. **An incidental finding of potential clinical significance has been found. 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 Minimal grade 1 C3-4 anterolisthesis associated with severe LEFT facet arthropathy and chronic inflammatory changes. Electronically Signed   By: Elon Alas M.D.   On: 05/18/2016 00:20   US Renal  Result Date: 05/18/2016 CLINICAL DATA:  Renal failure. EXAM: RENAL / URINARY TRACT ULTRASOUND COMPLETE COMPARISON:  MRI 09/02/2014. FINDINGS: Right Kidney: Length: 10.8 cm. Mild contour irregularity suggesting scarring or fetal lobulation . Echogenicity within normal limits. No mass or hydronephrosis visualized. Left Kidney: Length: 9.5 cm. Mild contour irregularity suggesting scarring or fetal lobulation. Echogenicity within normal limits. No mass or hydronephrosis visualized. Bladder: Appears normal for degree of bladder distention. IMPRESSION: 1. Mild bilateral renal contour irregularity suggesting scarring or fetal lobulation.  2. Exam is otherwise unremarkable. No focal abnormality identified. No hydronephrosis. Electronically Signed   By: Marcello Moores  Register   On: 05/18/2016 09:29    Micro Results    No results found for this or any previous visit (from the past 240 hour(s)).     Today   Subjective:   Shelby Douglas today has no headache,no chest abdominal pain,no new weakness tingling or numbness, feels much better wants to go home today.  Objective:   Blood pressure 120/61, pulse 88, temperature 98.2 F (36.8 C), temperature source Oral, resp. rate 18, height 5\' 3"  (1.6 m), weight 77.9 kg (171 lb 11.2 oz), SpO2 95 %.   Intake/Output Summary (Last 24 hours) at 05/19/16 1047 Last data filed at 05/19/16 0300  Gross per 24 hour  Intake          2535.83 ml  Output                0 ml  Net          2535.83 ml    Exam Awake Alert, Oriented x 3, No new F.N deficits, Normal affect [place].AT,PERRAL Supple Neck,No JVD, No cervical lymphadenopathy appriciated.  Symmetrical Chest wall movement, Good air movement bilaterally, CTAB RRR,No Gallops,Rubs or new Murmurs, No Parasternal Heave +ve B.Sounds, Abd Soft, Non tender, No organomegaly appriciated, No rebound -guarding or rigidity. No Cyanosis, Clubbing or edema, No new Rash or bruise  Data Review   CBC w Diff: Lab Results  Component  Value Date   WBC 6.0 05/19/2016   HGB 10.6 (L) 05/19/2016   HCT 31.3 (L) 05/19/2016   PLT 273 05/19/2016    CMP: Lab Results  Component Value Date   NA 140 05/19/2016   K 4.3 05/19/2016   CL 114 (H) 05/19/2016   CO2 22 05/19/2016   BUN 23 (H) 05/19/2016   CREATININE 1.29 (H) 05/19/2016  .   Total Time in preparing paper work, data evaluation and todays exam - 53 minutes  Shelby Douglas M.D on 05/19/2016 at 10:47 AM    Note: This dictation was prepared with Dragon dictation along with smaller phrase technology. Any transcriptional errors that result from this process are unintentional.

## 2016-05-19 NOTE — Progress Notes (Signed)
While rounding, [place] made initial visit with Pt who was in the process of Checking out. Had brief conversation with Pt and husband. Provided a celebratory prayer for the good care they were given and for the grace of going home!    05/19/16 1200  Clinical Encounter Type  Visited With Patient and family together;Health care provider  Visit Type Initial;Social support  Referral From Nurse  Spiritual Encounters  Spiritual Needs Prayer

## 2016-05-19 NOTE — Progress Notes (Signed)
Discharge instructions given and went over with patient and husband at bedside. Follow-up appointment reviewed. All questions answered. Patient discharged home via wheelchair by nursing staff. Madlyn Frankel, RN

## 2016-05-29 ENCOUNTER — Telehealth: Payer: Self-pay | Admitting: Emergency Medicine

## 2016-05-29 NOTE — Telephone Encounter (Addendum)
I called patient to assure she knows about incidental finding on ct scan from ED with recommendation of MRI to follow up sellar/suprasellar mass.  Left message asking her to call me.   Called [facility] and they have sent message to dr sparks regarding the incidental finding and recommendation of mri out patient follow up.  The pateint called me back.  She agrees to call dr sparks office about the MRI. She had appt tomorrow, but they told her to wait until next month to come in.  She was seen by PA for stitch removal.

## 2016-05-30 ENCOUNTER — Other Ambulatory Visit: Payer: Self-pay | Admitting: Internal Medicine

## 2016-05-30 DIAGNOSIS — E236 Other disorders of pituitary gland: Secondary | ICD-10-CM

## 2016-06-13 ENCOUNTER — Ambulatory Visit
Admission: RE | Admit: 2016-06-13 | Discharge: 2016-06-13 | Disposition: A | Discharge status: Home / Self Care | Payer: BLUE CROSS/BLUE SHIELD | Source: Ambulatory Visit | Attending: Internal Medicine | Admitting: Internal Medicine

## 2016-06-13 DIAGNOSIS — E237 Disorder of pituitary gland, unspecified: Secondary | ICD-10-CM | POA: Diagnosis present

## 2016-06-13 DIAGNOSIS — E236 Other disorders of pituitary gland: Secondary | ICD-10-CM

## 2016-06-13 MED ORDER — GADOBENATE DIMEGLUMINE 529 MG/ML IV SOLN
10.0000 mL | Freq: Once | INTRAVENOUS | Status: AC | PRN
  Administered 2016-06-13: 8 mL via INTRAVENOUS

## 2016-07-02 ENCOUNTER — Encounter: Payer: Self-pay | Admitting: Urology

## 2016-07-02 ENCOUNTER — Ambulatory Visit: Payer: BLUE CROSS/BLUE SHIELD | Admitting: Urology

## 2016-07-02 VITALS — BP 113/78 | HR 92 | Ht 62.0 in | Wt 163.0 lb

## 2016-07-02 DIAGNOSIS — N3946 Mixed incontinence: Secondary | ICD-10-CM | POA: Diagnosis not present

## 2016-07-02 NOTE — Progress Notes (Signed)
07/02/2016 9:36 AM   Shelby Douglas 08-10-1951 [id number]  Referring provider: Jinny Sanders, MD [address] [place], [place] [address]  Chief Complaint  Patient presents with  . Urinary Incontinence    36month follow up    HPI: The patient recently was assessed by gynecology for incontinence and was given Detrol and offered physical therapy. For the last year the patient sometimes leaks with coughing sneezing but also with bending and lifting. The primary promise urge incontinence wearing 2 pads a day moderately wet. The patient does not complain of bedwetting but gets up 4 times at night. He may be taking a diuretic and denies ankle edema. She voids every one hour and is not certain if she consented to a 2 are moving  Her flow was good. She had ureteroscopy years ago. She's had low back surgery. She has had a hysterectomy  On Detrol recently prescribed she no longer has nocturia is now pad free with reduced urge incontinence  Grade 1 hypermobility of the bladder neck and no stress incontinence. 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 Her nighttime frequency was moderately severe but is greatly improved as well. She does have a frequent bladder.  Today Patient still has SUI with lifting grand kids and cough and sneeze; UUI much better; still gets up 4x at night but improved; no UTI 2 pads per day moderately wet     PMH: Past Medical History:  Diagnosis Date  . Anxiety   . H/O dilation and curettage   . H/O tubal ligation   . Hyperlipidemia   . Hypertension   . Lumbar disc disease   . Skin cancer (melanoma) [facility])     Surgical History: Past Surgical History:  Procedure Laterality Date  . ABDOMINAL HYSTERECTOMY    . BACK SURGERY    . CESAREAN SECTION    . COLONOSCOPY WITH PROPOFOL N/A 04/18/2015   Procedure: COLONOSCOPY WITH PROPOFOL;  Surgeon: Josefine Class, MD;  Location: [facility] ENDOSCOPY;  Service: Endoscopy;  Laterality: N/A;  . DILATION AND CURETTAGE OF UTERUS    . SKIN CANCER EXCISION  04/2016   cheek  . TUBAL LIGATION      Home Medications:    Medication List       Accurate as of 07/02/16  9:36 AM. Always use your most recent med list.          citalopram 10 MG tablet Commonly known as:  CELEXA Take 10 mg by mouth daily.   cyclobenzaprine 10 MG tablet Commonly known as:  FLEXERIL Take 10 mg by mouth at bedtime as needed for muscle spasms.   estradiol 0.1 MG/GM vaginal cream Commonly known as:  ESTRACE VAGINAL 1/2 g twice a week intravaginal   fluticasone 50 MCG/ACT nasal spray Commonly known as:  FLONASE Place into both nostrils daily.   gabapentin 300 MG capsule Commonly known as:  NEURONTIN Take 300 mg by mouth 3 (three) times daily.   meloxicam 15 MG tablet Commonly known as:  MOBIC Take 15 mg by mouth daily.   tolterodine 4 MG 24 hr capsule Commonly known as:  DETROL LA Take 1 capsule (4 mg total) by mouth Nightly.   traMADol 50 MG tablet Commonly known as:  ULTRAM Take by mouth 2 (two) times daily.       Allergies:  Allergies  Allergen Reactions  . Latex Rash  Family History: Family History  Problem Relation Age of Onset  . Diabetes Mother   . Heart disease Mother   . Diabetes Father   . Heart disease Father   . Diabetes Maternal Grandmother   . Heart disease Maternal Grandmother   . Diabetes Maternal Grandfather   . Heart disease Maternal Grandfather   . Breast cancer Paternal Grandmother   . Uterine cancer Paternal Grandmother   . Diabetes Paternal Grandmother   . Heart disease Paternal Grandmother   . Diabetes Paternal Grandfather   . Heart disease Paternal Grandfather   . Ovarian cancer Neg Hx   . Colon cancer Neg Hx     Social History:  reports that she has never smoked. She has never used smokeless tobacco. She reports that she does not drink alcohol or use  drugs.  ROS: UROLOGY Frequent Urination?: No Hard to postpone urination?: No Burning/pain with urination?: No Get up at night to urinate?: Yes Leakage of urine?: Yes Urine stream starts and stops?: No Trouble starting stream?: No Do you have to strain to urinate?: No Blood in urine?: No Urinary tract infection?: No Sexually transmitted disease?: No Injury to kidneys or bladder?: No Painful intercourse?: No Weak stream?: No Currently pregnant?: No Vaginal bleeding?: No Last menstrual period?: n  Gastrointestinal Nausea?: No Vomiting?: No Indigestion/heartburn?: No Diarrhea?: No Constipation?: Yes  Constitutional Fever: No Night sweats?: No Weight loss?: No Fatigue?: No  Skin Skin rash/lesions?: No Itching?: No  Eyes Blurred vision?: No Double vision?: No  Ears/Nose/Throat Sore throat?: No Sinus problems?: No  Hematologic/Lymphatic Swollen glands?: No Easy bruising?: No  Cardiovascular Leg swelling?: No Chest pain?: No  Respiratory Cough?: No Shortness of breath?: No  Endocrine Excessive thirst?: No  Musculoskeletal Back pain?: Yes Joint pain?: No  Neurological Headaches?: No Dizziness?: No  Psychologic Depression?: No Anxiety?: Yes  Physical Exam: BP 113/78   Pulse 92   Ht 5\' 2"  (1.575 m)   Wt 163 lb (73.9 kg)   BMI 29.81 kg/m     Laboratory Data: Lab Results  Component Value Date   WBC 6.0 05/19/2016   HGB 10.6 (L) 05/19/2016   HCT 31.3 (L) 05/19/2016   MCV 88.1 05/19/2016   PLT 273 05/19/2016    Lab Results  Component Value Date   CREATININE 1.29 (H) 05/19/2016    No results found for: PSA  No results found for: TESTOSTERONE  No results found for: HGBA1C  Urinalysis    Component Value Date/Time   APPEARANCEUR Clear 03/26/2016 1337   GLUCOSEU Negative 03/26/2016 1337   BILIRUBINUR Negative 03/26/2016 1337   PROTEINUR Trace (A) 03/26/2016 1337   NITRITE Negative 03/26/2016 1337   LEUKOCYTESUR Trace (A)  03/26/2016 1337    Pertinent Imaging: none  Assessment & Plan:  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  No Follow-up on file.  Reece Packer, MD  [facility] [address], [place], [place] [address] [phone]

## 2016-07-27 ENCOUNTER — Ambulatory Visit: Payer: BLUE CROSS/BLUE SHIELD

## 2016-09-03 ENCOUNTER — Ambulatory Visit: Payer: BLUE CROSS/BLUE SHIELD | Admitting: Urology

## 2016-09-03 ENCOUNTER — Encounter: Payer: Self-pay | Admitting: Urology

## 2016-09-03 VITALS — BP 118/74 | HR 89 | Ht 62.0 in | Wt 173.0 lb

## 2016-09-03 DIAGNOSIS — R32 Unspecified urinary incontinence: Secondary | ICD-10-CM | POA: Diagnosis not present

## 2016-09-03 DIAGNOSIS — N3946 Mixed incontinence: Secondary | ICD-10-CM | POA: Diagnosis not present

## 2016-09-03 MED ORDER — MIRABEGRON ER 50 MG PO TB24: 50.0000 mg | ORAL_TABLET | Freq: Every day | ORAL | 11 refills | Status: AC

## 2016-09-03 NOTE — Progress Notes (Signed)
09/03/2016 9:06 AM   Shelby Douglas June 12, 1951 [id number]  Referring provider: Idelle Crouch, MD [facility] [place], [place] [address]  Chief Complaint  Patient presents with  . Urinary Incontinence    4wk recheck    HPI: The patient recently was assessed by gynecology for incontinence and was given Detrol and offered physical therapy. For the last year the patient sometimes leaks with coughing sneezing but also with bending and lifting. The primary promise urge incontinence wearing 2 pads a day moderately wet. The patient does not complain of bedwetting but gets up 4 times at night. He may be taking a diuretic and denies ankle edema. She voids every one hour and is not certain if she consented to a 2 are moving  On Detrol recently prescribed she no longer has nocturia is now pad free with reduced urge incontinence  Grade 1 hypermobility of the bladder neck and no stress incontinence. 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 Her nighttime frequency was moderately severe but is greatly improved as well. She does have a frequent bladder.  Last visit Patient still has SUI with lifting grand kids and cough and sneeze; UUI much better; still gets up 4x at night but improved; no UTI 2 pads per day moderately wet; given beta 3 agonist  Today  The patient no longer has nighttime frequency. Her predominant symptom now is leaking with coughing and sneezing and lifting her grandchildren. She is really not having any urgency incontinence and that she is its minimal.    PMH: Past Medical History:  Diagnosis Date  . Anxiety   . H/O dilation and curettage   . H/O tubal ligation   . Hyperlipidemia   . Hypertension   . Lumbar disc disease   . Skin cancer (melanoma) [facility])     Surgical History: Past Surgical History:  Procedure Laterality Date  .  ABDOMINAL HYSTERECTOMY    . BACK SURGERY    . CESAREAN SECTION    . COLONOSCOPY WITH PROPOFOL N/A 04/18/2015   Procedure: COLONOSCOPY WITH PROPOFOL;  Surgeon: Josefine Class, MD;  Location: [facility] ENDOSCOPY;  Service: Endoscopy;  Laterality: N/A;  . DILATION AND CURETTAGE OF UTERUS    . SKIN CANCER EXCISION  04/2016   cheek  . TUBAL LIGATION      Home Medications:  Allergies as of 09/03/2016      Reactions   Latex Rash      Medication List       Accurate as of 09/03/16  9:06 AM. Always use your most recent med list.          citalopram 10 MG tablet Commonly known as:  CELEXA Take 10 mg by mouth daily.   cyclobenzaprine 10 MG tablet Commonly known as:  FLEXERIL Take 10 mg by mouth at bedtime as needed for muscle spasms.   estradiol 0.1 MG/GM vaginal cream Commonly known as:  ESTRACE VAGINAL 1/2 g twice a week intravaginal   fluticasone 50 MCG/ACT nasal spray Commonly known as:  FLONASE Place into both nostrils daily.   gabapentin 300 MG capsule Commonly known as:  NEURONTIN Take 300 mg by mouth 3 (three) times daily.   meloxicam 15 MG tablet Commonly known as:  MOBIC Take 15 mg by mouth daily.   mirabegron ER 50 MG Tb24 tablet Commonly known as:  MYRBETRIQ Take 1 tablet (50 mg total) by  mouth daily.   traMADol 50 MG tablet Commonly known as:  ULTRAM Take by mouth 2 (two) times daily.       Allergies:  Allergies  Allergen Reactions  . Latex Rash    Family History: Family History  Problem Relation Age of Onset  . Diabetes Mother   . Heart disease Mother   . Diabetes Father   . Heart disease Father   . Diabetes Maternal Grandmother   . Heart disease Maternal Grandmother   . Diabetes Maternal Grandfather   . Heart disease Maternal Grandfather   . Breast cancer Paternal Grandmother   . Uterine cancer Paternal Grandmother   . Diabetes Paternal Grandmother   . Heart disease Paternal Grandmother   . Diabetes Paternal Grandfather   . Heart  disease Paternal Grandfather   . Ovarian cancer Neg Hx   . Colon cancer Neg Hx     Social History:  reports that she has never smoked. She has never used smokeless tobacco. She reports that she does not drink alcohol or use drugs.  ROS: UROLOGY Frequent Urination?: Yes Hard to postpone urination?: Yes Burning/pain with urination?: No Get up at night to urinate?: No Leakage of urine?: Yes Urine stream starts and stops?: No Trouble starting stream?: No Do you have to strain to urinate?: No Blood in urine?: No Urinary tract infection?: No Sexually transmitted disease?: No Injury to kidneys or bladder?: No Painful intercourse?: No Weak stream?: No Currently pregnant?: No Vaginal bleeding?: No Last menstrual period?: n  Gastrointestinal Nausea?: No Vomiting?: Yes Indigestion/heartburn?: No Diarrhea?: No Constipation?: Yes  Constitutional Fever: No Night sweats?: No Weight loss?: No Fatigue?: No  Skin Skin rash/lesions?: No Itching?: No  Eyes Blurred vision?: No Double vision?: No  Ears/Nose/Throat Sore throat?: No Sinus problems?: No  Hematologic/Lymphatic Swollen glands?: No Easy bruising?: Yes  Cardiovascular Leg swelling?: No Chest pain?: No  Respiratory Cough?: No Shortness of breath?: No  Endocrine Excessive thirst?: Yes  Musculoskeletal Back pain?: Yes Joint pain?: No  Neurological Headaches?: Yes Dizziness?: Yes  Psychologic Depression?: No Anxiety?: Yes  Physical Exam: BP 118/74   Pulse 89   Ht '5\' 2"'$  (1.575 m)   Wt 173 lb (78.5 kg)   BMI 31.64 kg/m     Laboratory Data: Lab Results  Component Value Date   WBC 6.0 05/19/2016   HGB 10.6 (L) 05/19/2016   HCT 31.3 (L) 05/19/2016   MCV 88.1 05/19/2016   PLT 273 05/19/2016    Lab Results  Component Value Date   CREATININE 1.29 (H) 05/19/2016    No results found for: PSA  No results found for: TESTOSTERONE  No results found for: HGBA1C  Urinalysis    Component  Value Date/Time   APPEARANCEUR Clear 03/26/2016 1337   GLUCOSEU Negative 03/26/2016 1337   BILIRUBINUR Negative 03/26/2016 1337   PROTEINUR Trace (A) 03/26/2016 1337   NITRITE Negative 03/26/2016 1337   LEUKOCYTESUR Trace (A) 03/26/2016 1337    Pertinent Imaging: none  Assessment & Plan:  I gave her-2 weeks of samples and a prescription and I'll reevaluate her in 4 months. She would like Bethena Roys to call her as a potential candidate for the stress incontinence pill study. On medication her primary complaint is stress incontinence  There are no diagnoses linked to this encounter.  No Follow-up on file.  Reece Packer, MD  [facility] [address] [place],  [address] [phone]

## 2016-12-31 ENCOUNTER — Ambulatory Visit: Payer: BLUE CROSS/BLUE SHIELD

## 2017-07-05 ENCOUNTER — Other Ambulatory Visit: Payer: Self-pay | Admitting: Neurological Surgery

## 2017-07-05 DIAGNOSIS — E236 Other disorders of pituitary gland: Secondary | ICD-10-CM

## 2017-07-19 ENCOUNTER — Ambulatory Visit
Admission: RE | Admit: 2017-07-19 | Discharge: 2017-07-19 | Disposition: A | Discharge status: Home / Self Care | Payer: Medicare Other | Source: Ambulatory Visit | Attending: Neurological Surgery | Admitting: Neurological Surgery

## 2017-07-19 DIAGNOSIS — R9089 Other abnormal findings on diagnostic imaging of central nervous system: Secondary | ICD-10-CM | POA: Diagnosis not present

## 2017-07-19 DIAGNOSIS — E236 Other disorders of pituitary gland: Secondary | ICD-10-CM

## 2017-07-19 DIAGNOSIS — E237 Disorder of pituitary gland, unspecified: Secondary | ICD-10-CM | POA: Diagnosis present

## 2017-07-19 MED ORDER — GADOBENATE DIMEGLUMINE 529 MG/ML IV SOLN
15.0000 mL | Freq: Once | INTRAVENOUS | Status: AC | PRN
  Administered 2017-07-19: 7 mL via INTRAVENOUS

## 2017-08-02 ENCOUNTER — Ambulatory Visit: Payer: BLUE CROSS/BLUE SHIELD | Admitting: Urology

## 2017-09-06 ENCOUNTER — Ambulatory Visit (INDEPENDENT_AMBULATORY_CARE_PROVIDER_SITE_OTHER): Payer: Medicare Other | Admitting: Urology

## 2017-09-06 ENCOUNTER — Other Ambulatory Visit
Admission: RE | Admit: 2017-09-06 | Discharge: 2017-09-06 | Disposition: A | Discharge status: Home / Self Care | Payer: Medicare Other | Source: Ambulatory Visit | Attending: Urology | Admitting: Urology

## 2017-09-06 ENCOUNTER — Encounter: Payer: Self-pay | Admitting: Urology

## 2017-09-06 ENCOUNTER — Other Ambulatory Visit: Payer: Self-pay

## 2017-09-06 VITALS — BP 118/65 | HR 109 | Ht 63.0 in | Wt 178.0 lb

## 2017-09-06 DIAGNOSIS — N393 Stress incontinence (female) (male): Secondary | ICD-10-CM

## 2017-09-06 DIAGNOSIS — N3941 Urge incontinence: Secondary | ICD-10-CM

## 2017-09-06 DIAGNOSIS — R32 Unspecified urinary incontinence: Secondary | ICD-10-CM | POA: Insufficient documentation

## 2017-09-06 LAB — URINALYSIS, COMPLETE (UACMP) WITH MICROSCOPIC
BILIRUBIN URINE: NEGATIVE
Bacteria, UA: NONE SEEN
Glucose, UA: NEGATIVE mg/dL
Hgb urine dipstick: NEGATIVE
KETONES UR: NEGATIVE mg/dL
LEUKOCYTES UA: NEGATIVE
NITRITE: NEGATIVE
Protein, ur: NEGATIVE mg/dL
RBC / HPF: NONE SEEN RBC/hpf (ref 0–5)
Specific Gravity, Urine: <1.005 — ABNORMAL LOW (ref 1.005–1.030)
pH: 6 (ref 5.0–8.0)

## 2017-09-06 LAB — BLADDER SCAN AMB NON-IMAGING

## 2017-09-06 NOTE — Progress Notes (Signed)
09/06/2017 1:05 PM   Shelby Douglas April 29, 1951 [id number]  Referring provider: Idelle Crouch, MD [facility] [place], [place] [address]  No chief complaint on file.   HPI: 67 year old female who was personally followed by Dr. Nicki Reaper MacDiarmid who presents today to discuss her refractory urinary symptoms.  She reports that she is scheduled to follow-up with me today as she thought that Dr. Vikki Ports left the practice.  She has a known history of mixed stress urinary incontinence but is primarily bothered by her urge.  She is tried multiple medications in the tried and failed Vesicare and Detrol in the past.  She is tearful today and very bothered by her episodes of gross urinary incontinence.  She reports that stand up, gets a sudden urge to void, and void large volumes incontinence necessitating use of diapers.  This is been slowly worsening since her last visit about a year ago.  She does have some mild baseline stress incontinence with leaking with lifting her grandchildren, laughing, sneezing.  Some pelvic floor excised in the past.  She is currently using topical estrogen cream.  Overall, she is frustrated in the medical system altogether.  She also has a pituitary tumor and feels like she is being passed off from one provider to another who different opinions on how she should be managed.   PMH: Past Medical History:  Diagnosis Date  . Anxiety   . H/O dilation and curettage   . H/O tubal ligation   . Hyperlipidemia   . Hypertension   . Lumbar disc disease   . Skin cancer (melanoma) [facility])     Surgical History: Past Surgical History:  Procedure Laterality Date  . ABDOMINAL HYSTERECTOMY    . BACK SURGERY    . CESAREAN SECTION    . COLONOSCOPY WITH PROPOFOL N/A 04/18/2015   Procedure: COLONOSCOPY WITH PROPOFOL;  Surgeon: Josefine Class, MD;  Location[facility] ENDOSCOPY;  Service: Endoscopy;  Laterality: N/A;  . DILATION AND  CURETTAGE OF UTERUS    . SKIN CANCER EXCISION  04/2016   cheek  . TUBAL LIGATION      Home Medications:  Allergies as of 09/06/2017      Reactions   Latex Rash      Medication List        Accurate as of 09/06/17 11:59 PM. Always use your most recent med list.          citalopram 10 MG tablet Commonly known as:  CELEXA Take 10 mg by mouth daily.   cyclobenzaprine 10 MG tablet Commonly known as:  FLEXERIL Take 10 mg by mouth at bedtime as needed for muscle spasms.   estradiol 0.1 MG/GM vaginal cream Commonly known as:  ESTRACE VAGINAL 1/2 g twice a week intravaginal   fluticasone 50 MCG/ACT nasal spray Commonly known as:  FLONASE Place into both nostrils daily.   gabapentin 300 MG capsule Commonly known as:  NEURONTIN Take 300 mg by mouth 3 (three) times daily.   lisinopril-hydrochlorothiazide 20-12.5 MG tablet Commonly known as:  PRINZIDE,ZESTORETIC Take by mouth.   meloxicam 15 MG tablet Commonly known as:  MOBIC Take 15 mg by mouth daily.   mirabegron ER 50 MG Tb24 tablet Commonly known as:  MYRBETRIQ Take 1 tablet (50 mg total) by mouth daily.   traMADol 50 MG tablet Commonly known as:  ULTRAM Take by mouth 2 (two) times daily.       Allergies:  Allergies  Allergen Reactions  .  Latex Rash    Family History: Family History  Problem Relation Age of Onset  . Diabetes Mother   . Heart disease Mother   . Diabetes Father   . Heart disease Father   . Diabetes Maternal Grandmother   . Heart disease Maternal Grandmother   . Diabetes Maternal Grandfather   . Heart disease Maternal Grandfather   . Breast cancer Paternal Grandmother   . Uterine cancer Paternal Grandmother   . Diabetes Paternal Grandmother   . Heart disease Paternal Grandmother   . Diabetes Paternal Grandfather   . Heart disease Paternal Grandfather   . Ovarian cancer Neg Hx   . Colon cancer Neg Hx     Social History:  reports that  has never smoked. she has never used  smokeless tobacco. She reports that she does not drink alcohol or use drugs.  ROS: UROLOGY Frequent Urination?: Yes Hard to postpone urination?: Yes Burning/pain with urination?: No Get up at night to urinate?: Yes Leakage of urine?: Yes Urine stream starts and stops?: Yes Trouble starting stream?: No Do you have to strain to urinate?: No Blood in urine?: No Urinary tract infection?: No Sexually transmitted disease?: No Injury to kidneys or bladder?: No Painful intercourse?: No Weak stream?: No Currently pregnant?: No Vaginal bleeding?: No Last menstrual period?: n  Gastrointestinal Nausea?: No Vomiting?: No Indigestion/heartburn?: No Diarrhea?: No Constipation?: Yes  Constitutional Fever: No Night sweats?: No Weight loss?: No Fatigue?: No  Skin Skin rash/lesions?: No Itching?: No  Eyes Blurred vision?: No Double vision?: No  Ears/Nose/Throat Sore throat?: No Sinus problems?: No  Hematologic/Lymphatic Swollen glands?: No Easy bruising?: No  Cardiovascular Leg swelling?: No Chest pain?: No  Respiratory Cough?: No Shortness of breath?: No  Endocrine Excessive thirst?: No  Musculoskeletal Back pain?: Yes Joint pain?: No  Neurological Headaches?: Yes Dizziness?: No  Psychologic Depression?: No Anxiety?: No  Physical Exam: BP 118/65   Pulse (!) 109   Ht 5\' 3"  (1.6 m)   Wt 178 lb (80.7 kg)   BMI 31.53 kg/m   Constitutional:  Alert and oriented, No acute distress.  Tearful. HEENT: [place] AT, moist mucus membranes.  Trachea midline, no masses. Cardiovascular: No clubbing, cyanosis, or edema. Respiratory: Normal respiratory effort, no increased work of breathing. Skin: No rashes, bruises or suspicious lesions. Neurologic: Grossly intact, no focal deficits, moving all 4 extremities. Psychiatric: Normal mood and affect.  Laboratory Data: Lab Results  Component Value Date   WBC 6.0 05/19/2016   HGB 10.6 (L) 05/19/2016   HCT 31.3 (L)  05/19/2016   MCV 88.1 05/19/2016   PLT 273 05/19/2016    Lab Results  Component Value Date   CREATININE 1.29 (H) 05/19/2016   Urinalysis Component     Latest Ref Rng & Units 09/06/2017  Color, Urine     YELLOW STRAW (A)  Appearance     CLEAR CLEAR  Specific Gravity, Urine     1.005 - 1.030 <1.005 (L)  pH     5.0 - 8.0 6.0  Glucose     NEGATIVE mg/dL NEGATIVE  Hgb urine dipstick     NEGATIVE NEGATIVE  Bilirubin Urine     NEGATIVE NEGATIVE  Ketones, ur     NEGATIVE mg/dL NEGATIVE  Protein     NEGATIVE mg/dL NEGATIVE  Nitrite     NEGATIVE NEGATIVE  Leukocytes, UA     NEGATIVE NEGATIVE  Squamous Epithelial / LPF     NONE SEEN 0-5 (A)  WBC, UA  0 - 5 WBC/hpf 0-5  RBC / HPF     0 - 5 RBC/hpf NONE SEEN  Bacteria, UA     NONE SEEN NONE SEEN    Pertinent Imaging: PVR 0 cc  Assessment & Plan: 67 year old female with refractory incontinence symptoms.  We had a lengthy discussion today regarding the difference between stress and urge incontinence and treatment options for each.    1. Urge incontinence of urine Refractory OAB despite multiple anticholinergics, beta 3 agonist We discussed adding an anticholinergic to her beta 3 versus pursuing treatment with alternative options including Botox, posterior nerve stimulation, or InterStim She was given literature on each of those 3 modalities We will have her follow-up with Dr. Matilde Sprang as she already has an established relationship with him in a few weeks to see if she like to pursue any of those options - BLADDER SCAN AMB NON-IMAGING  2. Stress incontinence, female Minimal bother compared to bother from #1   F/u in a few weeks with Dr. Dolphus Jenny, [place] [address] [place], [place] [address] [phone]

## 2017-09-18 ENCOUNTER — Ambulatory Visit: Payer: BLUE CROSS/BLUE SHIELD | Admitting: Urology

## 2017-09-30 ENCOUNTER — Encounter: Payer: Self-pay | Admitting: Urology

## 2017-09-30 ENCOUNTER — Ambulatory Visit: Payer: Medicare Other | Admitting: Urology

## 2018-08-01 ENCOUNTER — Other Ambulatory Visit: Payer: Self-pay

## 2018-08-01 ENCOUNTER — Ambulatory Visit
Admission: EM | Admit: 2018-08-01 | Discharge: 2018-08-01 | Disposition: A | Discharge status: Home / Self Care | Payer: Medicare Other | Attending: Family Medicine | Admitting: Family Medicine

## 2018-08-01 ENCOUNTER — Encounter: Payer: Self-pay | Admitting: Emergency Medicine

## 2018-08-01 DIAGNOSIS — Z79899 Other long term (current) drug therapy: Secondary | ICD-10-CM | POA: Insufficient documentation

## 2018-08-01 DIAGNOSIS — R51 Headache: Secondary | ICD-10-CM | POA: Insufficient documentation

## 2018-08-01 DIAGNOSIS — Z791 Long term (current) use of non-steroidal anti-inflammatories (NSAID): Secondary | ICD-10-CM | POA: Diagnosis not present

## 2018-08-01 DIAGNOSIS — R69 Illness, unspecified: Secondary | ICD-10-CM

## 2018-08-01 DIAGNOSIS — J029 Acute pharyngitis, unspecified: Secondary | ICD-10-CM | POA: Diagnosis present

## 2018-08-01 DIAGNOSIS — Z79891 Long term (current) use of opiate analgesic: Secondary | ICD-10-CM | POA: Insufficient documentation

## 2018-08-01 DIAGNOSIS — J111 Influenza due to unidentified influenza virus with other respiratory manifestations: Secondary | ICD-10-CM

## 2018-08-01 LAB — RAPID STREP SCREEN (MED CTR MEBANE ONLY): STREPTOCOCCUS, GROUP A SCREEN (DIRECT): NEGATIVE

## 2018-08-01 LAB — RAPID INFLUENZA A&B ANTIGENS (ARMC ONLY)
INFLUENZA A (ARMC): NEGATIVE
INFLUENZA B (ARMC): NEGATIVE

## 2018-08-01 MED ORDER — OSELTAMIVIR PHOSPHATE 75 MG PO CAPS: 75.0000 mg | ORAL_CAPSULE | Freq: Two times a day (BID) | ORAL | 0 refills | Status: AC

## 2018-08-01 MED ORDER — LIDOCAINE VISCOUS HCL 2 % MT SOLN: OROMUCOSAL | 0 refills | Status: AC

## 2018-08-01 NOTE — ED Provider Notes (Signed)
[facility]    CSN: [id number] Arrival date & time: 08/01/18  1349  History   Chief Complaint Chief Complaint  Patient presents with  . Sore Throat  . Headache   HPI  67 year old female presents with sore throat, headache, and body aches.  Started last night.  She reports severe sore throat, headache, and body aches.  She reports that the symptoms are quite severe.  Worsening.  She has used NyQuil without resolution.  No associated fever that she is aware of.  Patient believes that she has both influenza and strep throat.  No known exacerbating factors.  No other reported symptoms.  No other complaints.  History reviewed and updated as below.  Past Medical History:  Diagnosis Date  . Anxiety   . H/O dilation and curettage   . H/O tubal ligation   . Hyperlipidemia   . Hypertension   . Lumbar disc disease   . Skin cancer (melanoma) [facility])    Patient Active Problem List   Diagnosis Date Noted  . Hypotension 05/18/2016  . Renal failure 05/18/2016  . Chronic constipation 03/06/2016  . 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 Status post total abdominal hysterectomy and bilateral salpingo-oophorectomy (TAH-BSO) 03/06/2016  . 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 Cystocele 03/06/2016  . Rectocele 03/06/2016  . Vaginal atrophy 03/06/2016   Past Surgical History:  Procedure Laterality Date  . ABDOMINAL HYSTERECTOMY    . BACK SURGERY    . CESAREAN SECTION    . COLONOSCOPY WITH PROPOFOL N/A 04/18/2015   Procedure: COLONOSCOPY WITH PROPOFOL;  Surgeon: Josefine Class, MD;  Location: [facility] ENDOSCOPY;  Service: Endoscopy;  Laterality: N/A;  . DILATION AND CURETTAGE OF UTERUS    . SKIN CANCER EXCISION  04/2016   cheek  . TUBAL LIGATION      OB History    Gravida  6   Para  4   Term  3   Preterm  1   AB  2   Living  4     SAB  2   TAB      Ectopic      Multiple      Live Births  4          Home Medications     Prior to Admission medications   Medication Sig Start Date End Date Taking? Authorizing Provider  citalopram (CELEXA) 10 MG tablet Take 10 mg by mouth daily.   Yes [provider]  cyclobenzaprine (FLEXERIL) 10 MG tablet Take 10 mg by mouth at bedtime as needed for muscle spasms.   Yes [provider]  estradiol (ESTRACE VAGINAL) 0.1 MG/GM vaginal cream 1/2 g twice a week intravaginal 03/06/16  Yes Defrancesco, Alanda Slim, MD  fluticasone (FLONASE) 50 MCG/ACT nasal spray Place into both nostrils daily.   Yes [provider]  gabapentin (NEURONTIN) 300 MG capsule Take 300 mg by mouth 3 (three) times daily.   Yes [provider]  lisinopril-hydrochlorothiazide (PRINZIDE,ZESTORETIC) 20-12.5 MG tablet Take by mouth. 07/19/17  Yes [provider]  meloxicam (MOBIC) 15 MG tablet Take 15 mg by mouth daily.   Yes [provider]  mirabegron ER (MYRBETRIQ) 50 MG TB24 tablet Take 1 tablet (50 mg total) by mouth daily. 09/03/16  Yes MacDiarmid, Nicki Reaper, MD  traMADol (ULTRAM) 50 MG tablet Take by mouth 2 (two) times daily.   Yes [provider]  lidocaine (XYLOCAINE) 2 % solution Gargle 15 mL every 3 hours as needed.  May swallow if desired. 08/01/18   Coral Spikes, DO  oseltamivir (TAMIFLU) 75 MG capsule Take 1 capsule (75 mg total) by mouth every 12 (twelve) hours. 08/01/18   Coral Spikes, DO    Family History Family History  Problem Relation Age of Onset  . Diabetes Mother   . Heart disease Mother   . Diabetes Father   . Heart disease Father   . Diabetes Maternal Grandmother   . Heart disease Maternal Grandmother   . Diabetes Maternal Grandfather   . Heart disease Maternal Grandfather   . Breast cancer Paternal Grandmother   . Uterine cancer Paternal Grandmother   . Diabetes Paternal Grandmother   . Heart disease Paternal Grandmother   . Diabetes Paternal Grandfather   . Heart disease Paternal Grandfather   . Ovarian cancer Neg Hx    . Colon cancer Neg Hx     Social History Social History   Tobacco Use  . Smoking status: Never Smoker  . Smokeless tobacco: Never Used  Substance Use Topics  . Alcohol use: No    Comment: monthly  . Drug use: No     Allergies   Latex   Review of Systems Review of Systems  Constitutional: Negative for fever.  HENT: Positive for sore throat.   Musculoskeletal:       Body aches.  Neurological: Positive for headaches.   Physical Exam Triage Vital Signs ED Triage Vitals  Enc Vitals Group     BP 08/01/18 1405 131/60     Pulse Rate 08/01/18 1405 (!) 112     Resp 08/01/18 1405 18     Temp 08/01/18 1405 98.4 F (36.9 C)     Temp Source 08/01/18 1405 Oral     SpO2 08/01/18 1405 98 %     Weight 08/01/18 1406 157 lb (71.2 kg)     Height 08/01/18 1406 5\' 2"  (1.575 m)     Head Circumference --      Peak Flow --      Pain Score 08/01/18 1406 9     Pain Loc --      Pain Edu? --      Excl. in [place]? --    Updated Vital Signs BP 131/60 (BP Location: Left Arm)   Pulse (!) 112   Temp 98.4 F (36.9 C) (Oral)   Resp 18   Ht 5\' 2"  (1.575 m)   Wt 71.2 kg   SpO2 98%   BMI 28.72 kg/m   Visual Acuity Right Eye Distance:   Left Eye Distance:   Bilateral Distance:    Right Eye Near:   Left Eye Near:    Bilateral Near:     Physical Exam Vitals signs and nursing note reviewed.  Constitutional:      General: She is not in acute distress. HENT:     Head: Normocephalic and atraumatic.     Right Ear: Tympanic membrane normal.     Left Ear: Tympanic membrane normal.     Mouth/Throat:     Comments: Oropharynx with moderate erythema. No exudate. Cardiovascular:     Rate and Rhythm: Regular rhythm. Tachycardia present.  Pulmonary:     Effort: Pulmonary effort is normal. No respiratory distress.     Breath sounds: No wheezing or rales.  Neurological:     Mental Status: She is alert.  Psychiatric:        Behavior: Behavior normal.     Comments: Flat affect.    UC  Treatments /  Results  Labs (all labs ordered are listed, but only abnormal results are displayed) Labs Reviewed  RAPID STREP SCREEN ([facility] ONLY)  RAPID INFLUENZA A&B ANTIGENS ([facility] ONLY)  CULTURE, GROUP A STREP [facility])    EKG None  Radiology No results found.  Procedures Procedures (including critical care time)  Medications Ordered in UC Medications - No data to display  Initial Impression / Assessment and Plan / UC Course  I have reviewed the triage vital signs and the nursing notes.  Pertinent labs & imaging results that were available during my care of the patient were reviewed by me and considered in my medical decision making (see chart for details).    67 year old female presents with an influenza-like illness. Testing negative here today. Placing on Tamiflu (given concern for false negative testing). Viscous lidocaine for sore throat.  Final Clinical Impressions(s) / UC Diagnoses   Final diagnoses:  Influenza-like illness     Discharge Instructions     Rest. Fluids.  Medication as directed.  Take care  Dr. Lacinda Axon     ED Prescriptions    Medication Sig Dispense Auth. Provider   oseltamivir (TAMIFLU) 75 MG capsule Take 1 capsule (75 mg total) by mouth every 12 (twelve) hours. 10 capsule Mia Winthrop G, DO   lidocaine (XYLOCAINE) 2 % solution Gargle 15 mL every 3 hours as needed. May swallow if desired. 200 mL Coral Spikes, DO     Controlled Substance Prescriptions [place] Controlled Substance Registry consulted? Not Applicable   Coral Spikes, DO 08/01/18 1529

## 2018-08-01 NOTE — ED Triage Notes (Signed)
Patient c/o sore throat, headache and body aches that started last night. She went to work today and her symptoms got worse.

## 2018-08-01 NOTE — Discharge Instructions (Addendum)
Rest.   Fluids.  Medication as directed.  Take care  Dr. Samon Dishner  

## 2018-08-02 ENCOUNTER — Telehealth (HOSPITAL_COMMUNITY): Payer: Self-pay | Admitting: Emergency Medicine

## 2018-08-02 LAB — CULTURE, GROUP A STREP (THRC)

## 2018-08-02 MED ORDER — PENICILLIN V POTASSIUM 500 MG PO TABS: 500.0000 mg | ORAL_TABLET | Freq: Two times a day (BID) | ORAL | 0 refills | Status: DC

## 2018-08-02 NOTE — Telephone Encounter (Signed)
Culture is positive for group A Strep germ.  Prescription for penicillin V 500mg bid x 10d #20 no refills sent to the pharmacy of record. Attempted to reach patient. No answer at this time. Voicemail left.    

## 2018-08-03 ENCOUNTER — Telehealth (HOSPITAL_COMMUNITY): Payer: Self-pay | Admitting: Emergency Medicine

## 2018-08-03 MED ORDER — PENICILLIN V POTASSIUM 500 MG PO TABS: 500.0000 mg | ORAL_TABLET | Freq: Two times a day (BID) | ORAL | 0 refills | Status: AC

## 2018-08-03 NOTE — Telephone Encounter (Signed)
Attempted to reach patient x2. Husband answered and will have her call back as soon as possible.

## 2018-08-03 NOTE — Telephone Encounter (Signed)
Pt called back about test results. All questions answered. Medicine sent to correct pharmacy.

## 2019-12-16 ENCOUNTER — Other Ambulatory Visit: Payer: Self-pay | Admitting: Internal Medicine

## 2019-12-16 DIAGNOSIS — Z1231 Encounter for screening mammogram for malignant neoplasm of breast: Secondary | ICD-10-CM

## 2020-02-08 ENCOUNTER — Other Ambulatory Visit: Payer: Self-pay | Admitting: Family Medicine

## 2020-02-08 ENCOUNTER — Other Ambulatory Visit: Payer: Self-pay

## 2020-02-08 ENCOUNTER — Ambulatory Visit
Admission: RE | Admit: 2020-02-08 | Discharge: 2020-02-08 | Disposition: A | Discharge status: Home / Self Care | Payer: Medicare Other | Source: Ambulatory Visit | Attending: Family Medicine | Admitting: Family Medicine

## 2020-02-08 DIAGNOSIS — R1013 Epigastric pain: Secondary | ICD-10-CM

## 2020-02-08 DIAGNOSIS — R509 Fever, unspecified: Secondary | ICD-10-CM

## 2020-02-08 DIAGNOSIS — R079 Chest pain, unspecified: Secondary | ICD-10-CM | POA: Diagnosis present

## 2020-02-08 MED ORDER — IOHEXOL 300 MG/ML  SOLN
100.0000 mL | Freq: Once | INTRAMUSCULAR | Status: AC | PRN
  Administered 2020-02-08: 100 mL via INTRAVENOUS

## 2020-03-17 ENCOUNTER — Other Ambulatory Visit: Payer: Self-pay | Admitting: Physical Medicine and Rehabilitation

## 2020-03-17 DIAGNOSIS — M5416 Radiculopathy, lumbar region: Secondary | ICD-10-CM

## 2020-03-18 ENCOUNTER — Ambulatory Visit
Admission: RE | Admit: 2020-03-18 | Discharge: 2020-03-18 | Disposition: A | Discharge status: Home / Self Care | Payer: Medicare Other | Source: Ambulatory Visit | Attending: Physical Medicine and Rehabilitation | Admitting: Physical Medicine and Rehabilitation

## 2020-03-18 ENCOUNTER — Other Ambulatory Visit: Payer: Self-pay

## 2020-03-18 DIAGNOSIS — M5416 Radiculopathy, lumbar region: Secondary | ICD-10-CM | POA: Diagnosis present

## 2020-04-27 ENCOUNTER — Other Ambulatory Visit (HOSPITAL_COMMUNITY): Payer: Self-pay | Admitting: Gastroenterology

## 2020-04-27 ENCOUNTER — Other Ambulatory Visit: Payer: Self-pay | Admitting: Gastroenterology

## 2020-04-27 DIAGNOSIS — R1084 Generalized abdominal pain: Secondary | ICD-10-CM

## 2020-05-04 ENCOUNTER — Ambulatory Visit: Admission: RE | Admit: 2020-05-04 | Payer: Medicare Other | Source: Ambulatory Visit

## 2021-03-08 IMAGING — CT CT ABD-PELV W/ CM
2 of 5 series · 16 of 46 positions shown, 18 images · IV contrast (omnipaque)
Comparison: None.

CLINICAL DATA: Nausea, epigastric pain and constipation for 1
month, worsening.

EXAM:
CT ABDOMEN AND PELVIS WITH CONTRAST
TECHNIQUE: Multidetector CT imaging of the abdomen and pelvis was performed
using the standard protocol following bolus administration of
intravenous contrast.
CONTRAST:  100 mL OMNIPAQUE IOHEXOL 300 MG/ML  SOLN

[Series 2: abd pelvis 5.00 · axial · 0.66mm/px · z∈[-1432,-1027]mm · 13 of 93 slices shown, 15 images]
[im 6/93  soft-tissue]
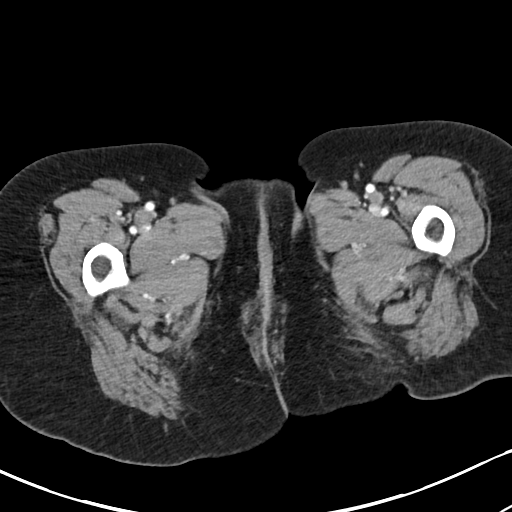
[im 6/93  bone]
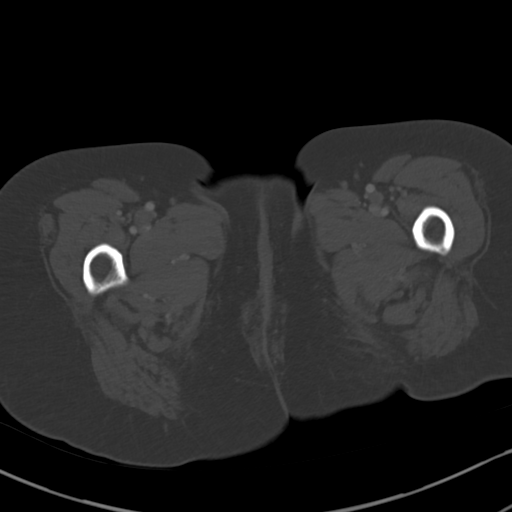
[im 11/93  soft-tissue]
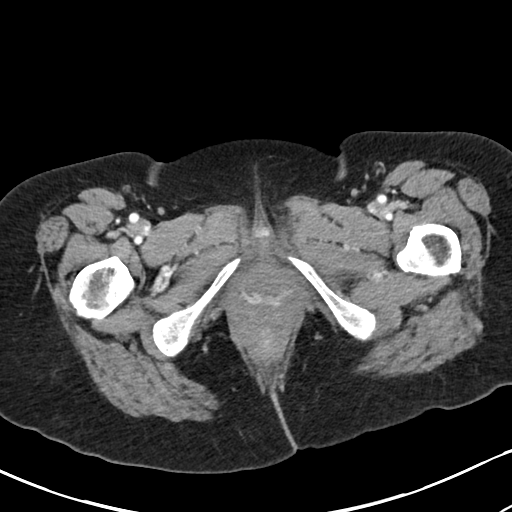
[im 21/93  soft-tissue]
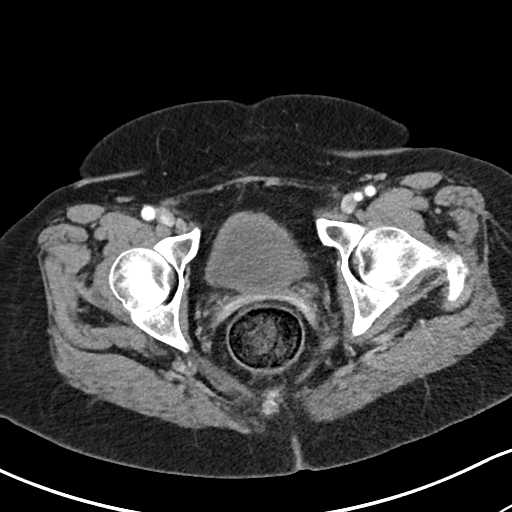
[im 26/93  soft-tissue]
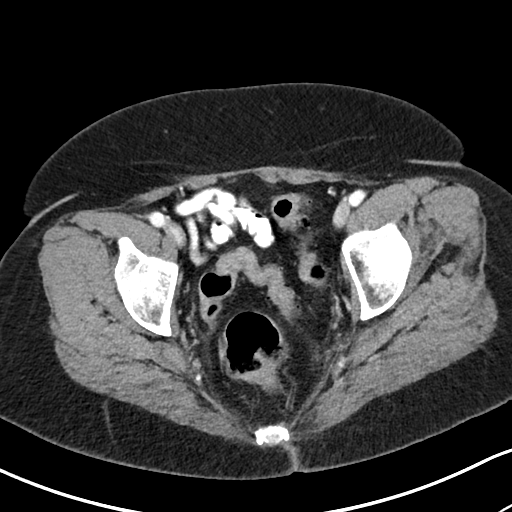
[im 31/93  soft-tissue]
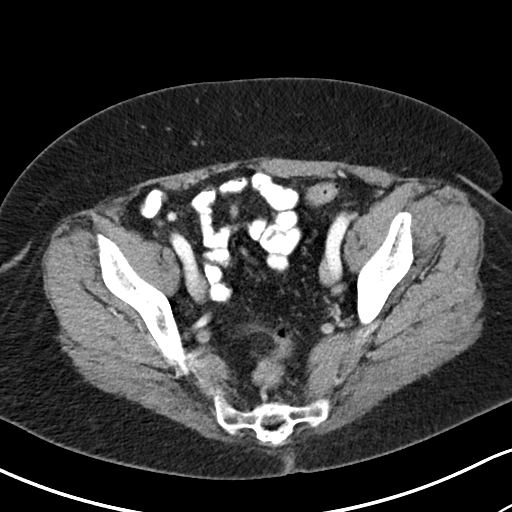
[im 41/93  soft-tissue]
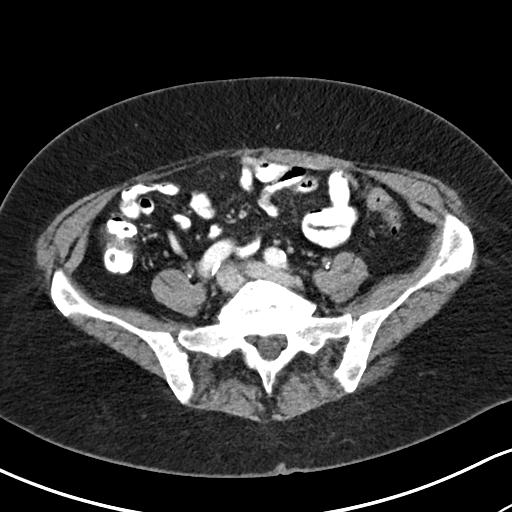
[im 47/93  soft-tissue]
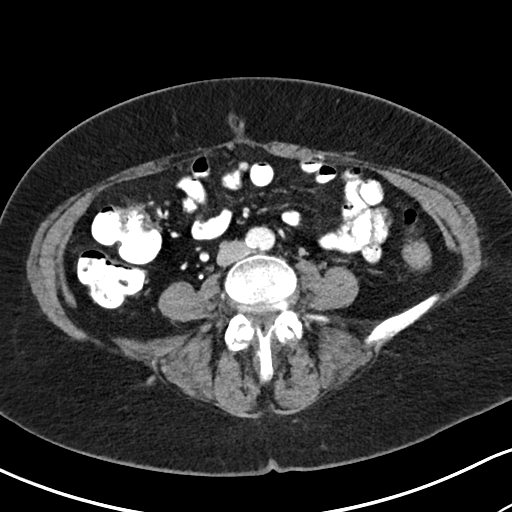
[im 52/93  soft-tissue]
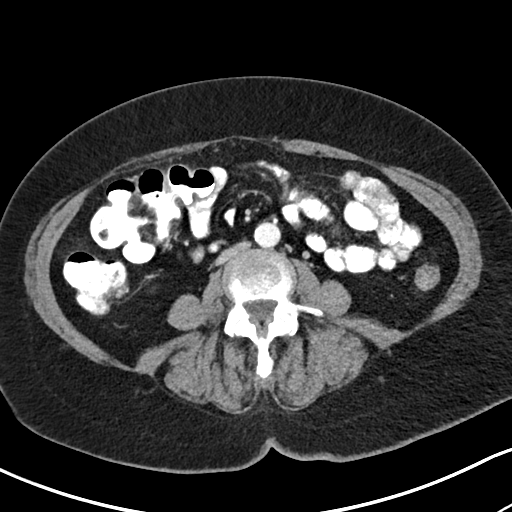
[im 62/93  soft-tissue]
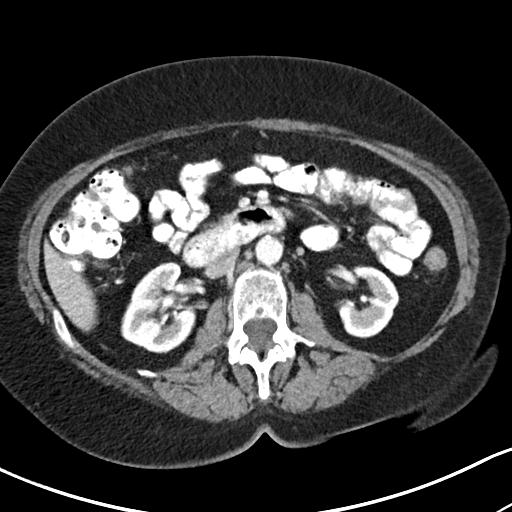
[im 62/93  bone]
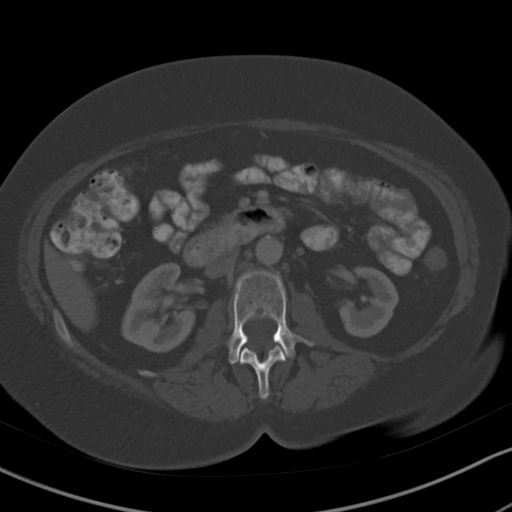
[im 67/93  soft-tissue]
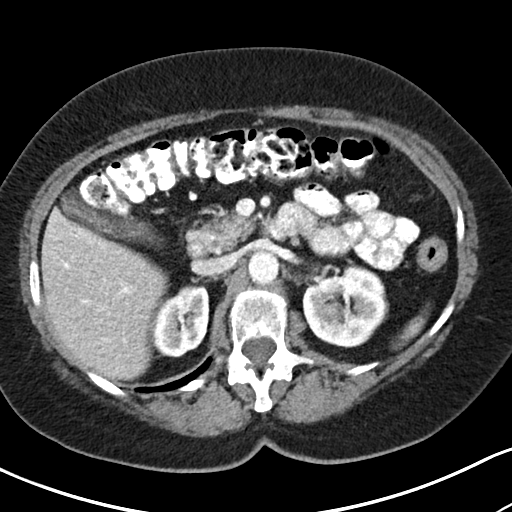
[im 72/93  soft-tissue]
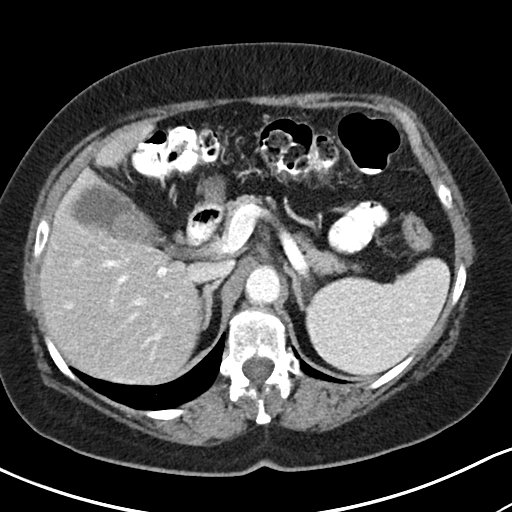
[im 82/93  soft-tissue]
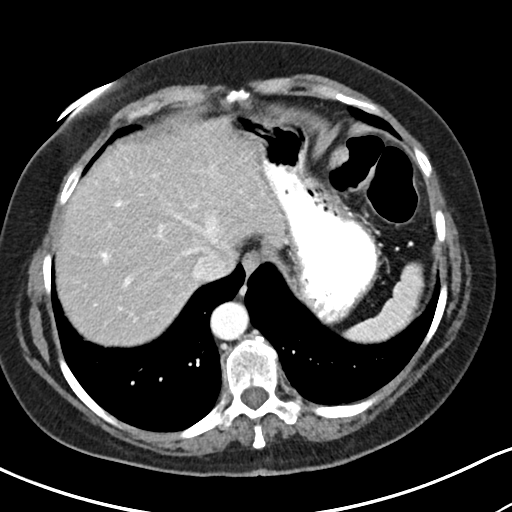
[im 87/93  soft-tissue]
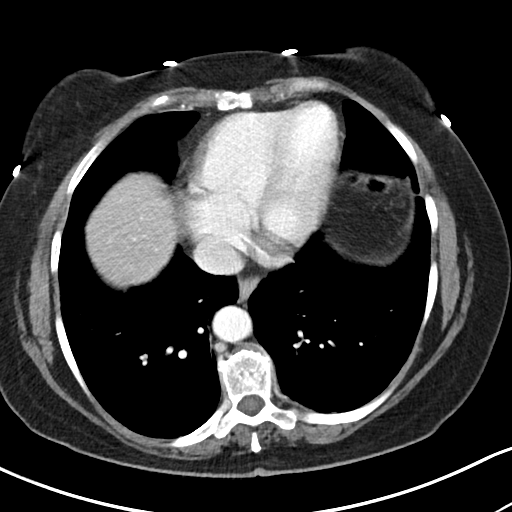

[Series 4: coronals abd pelvis 2.00 cor · coronal · 0.66mm/px · 3 of 148 slices shown]
[im 50/148  soft-tissue]
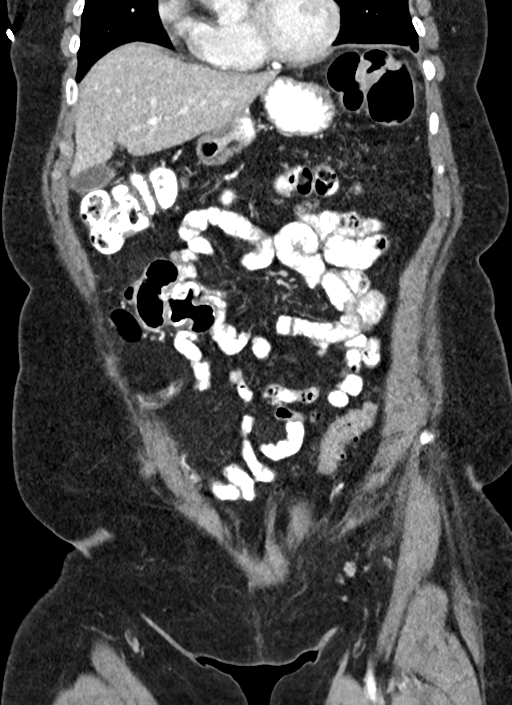
[im 66/148  soft-tissue]
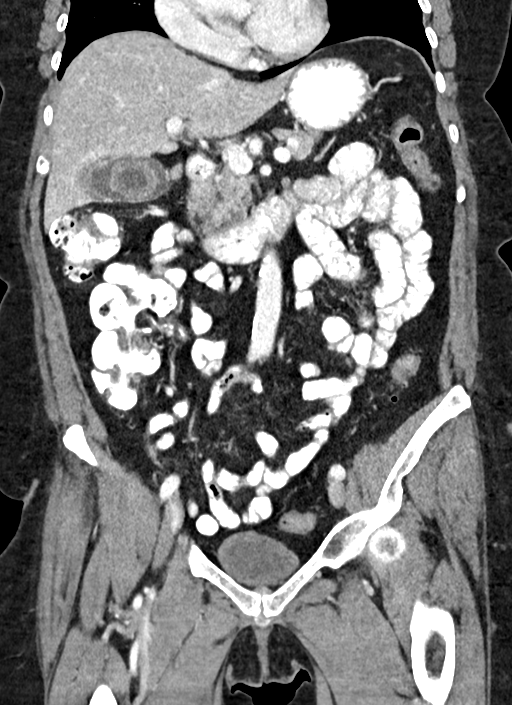
[im 82/148  soft-tissue]
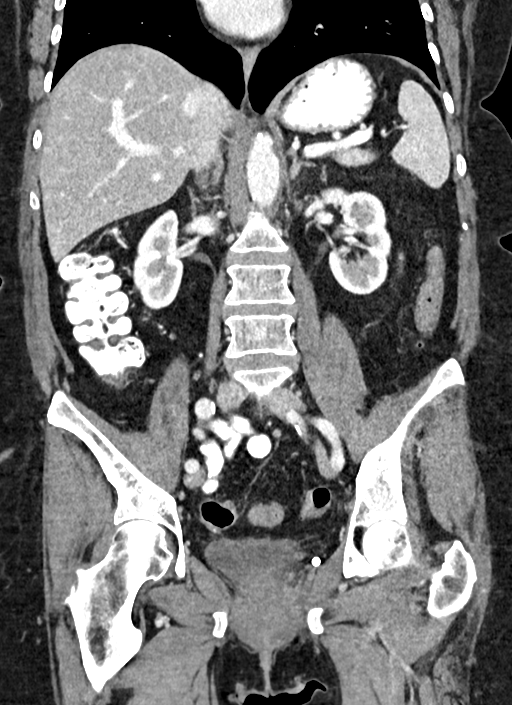

[16 of 46 positions shown; findings below may reference images not displayed]

FINDINGS: Lower chest: Lung bases clear.  No pleural or pericardial effusion.

Hepatobiliary: A 3.4 cm stone is seen in the gallbladder. No
inflammatory change about the gallbladder is present. The liver is
low attenuating consistent with fatty infiltration. No focal liver
lesion. Biliary tree unremarkable.

Pancreas: Unremarkable. No pancreatic ductal dilatation or
surrounding inflammatory changes.

Spleen: Normal in size without focal abnormality.

Adrenals/Urinary Tract: Adrenal glands are unremarkable. Kidneys are
normal, without renal calculi, focal lesion, or hydronephrosis.
Bladder is unremarkable.

Stomach/Bowel: Stomach is within normal limits. Appendix appears
normal. No evidence of bowel wall thickening, distention, or
inflammatory changes.

Vascular/Lymphatic: Aortic atherosclerosis. No enlarged abdominal or
pelvic lymph nodes.

Reproductive: Status post hysterectomy. No adnexal masses.

Other: None.

Musculoskeletal: No acute or focal abnormality. Lower lumbar
degenerative change noted.
IMPRESSION: No acute abnormality or finding to explain the patient's symptoms.

0.4 cm gallstone without evidence of cholecystitis.

Fatty infiltration of the liver.

Aortic Atherosclerosis ([id number]).

## 2021-09-15 ENCOUNTER — Other Ambulatory Visit

## 2021-09-15 ENCOUNTER — Encounter (INDEPENDENT_AMBULATORY_CARE_PROVIDER_SITE_OTHER)

## 2021-09-15 ENCOUNTER — Inpatient Hospital Stay: Admit: 2021-09-15 | Disposition: A | Discharge status: Home / Self Care | Payer: MEDICARE | Attending: Family Medicine

## 2021-09-15 VITALS — BP 176/90 | HR 90 | Temp 97.7°F | Resp 18 | Ht 62.0 in | Wt 160.0 lb

## 2021-09-15 DIAGNOSIS — H539 Unspecified visual disturbance: Secondary | ICD-10-CM

## 2021-09-15 NOTE — ED Provider Notes (Signed)
History  Chief Complaint   Patient presents with   . Eye Problem     Pt c/o right eye issue. Pt states she saw a flash of light in her eye yesterday and then lost sight for a few minutes. Today she has discharge in that eye      A visual disturbance of the right eye.  Yesterday morning she noticed a bright flash of light starting in the periphery of her right and traveling diagonally across.  She lost vision in the eye for several minutes.  Since then she has had continual visual disturbances seeing waves and many floaters.  She has a history of floaters in her left eye but reports that this is very different and much more extensive than anything she has experienced before.  She does have a slight headache as well.  She denies any history of migraines or atrial fibrillation.  Her right eye has been watery today.          Past Medical History:   Diagnosis Date   . Hypertension        Past Surgical History:   Procedure Laterality Date   . CHOLECYSTECTOMY     . HYSTERECTOMY         No family history on file.    Social History     Tobacco Use   . Smoking status: Never   . Smokeless tobacco: Never   Vaping Use   . Vaping Use: Never used   Substance Use Topics   . Alcohol use: Never   . Drug use: Never       Review of Systems   Eyes: Positive for discharge and visual disturbance.       Physical Exam  Vitals:    09/15/21 0921   BP: (!) 176/90   BP Location: Right arm   Patient Position: Sitting   Pulse: 90   Resp: 18   Temp: 36.5 C (97.7 F)   TempSrc: Oral   SpO2: 97%   Weight: 72.6 kg   Height: 1.575 m       Physical Exam  Vitals and nursing note reviewed.   Constitutional:       Appearance: Normal appearance.   HENT:      Head: Normocephalic.   Eyes:      Comments: PERRL, EOMI, no conjunctival injection, optic disks flat with sharp margins bilaterally, no hemorrhages or exudates seen.  Visual acuity is 20/50 OD and OU and 20/100 OS   Musculoskeletal:         General: Normal range of motion.      Cervical back: Normal  range of motion and neck supple.   Skin:     General: Skin is warm and dry.   Neurological:      General: No focal deficit present.      Mental Status: She is alert and oriented to person, place, and time.   Psychiatric:         Mood and Affect: Mood normal.         Behavior: Behavior normal.         Thought Content: Thought content normal.         Judgment: Judgment normal.         Visual Acuity  Bilateral Distance: 20/50  R Distance: 20/50  L Distance: 20/100        No orders to display     Labs Reviewed - No data to display    Procedures  Procedures    UC Course  Diagnoses as of 09/15/21 0942   Visual disturbance of one eye       Medical Decision Making  Patient presents with visual disturbance and transient visual loss in her right eye.  Her visual acuity is decreased from normal in the right eye but is actually better than the left eye.  However, the differential diagnosis includes retinal detachment.  While we are in the exam room, her daughter texted her that she has an appointment with [facility] at 11 AM this morning which is 90 minutes from now.  I advised her that that is absolutely what she needs to do and she will had there directly.        Discharge Meds  ED Prescriptions    None         Home Meds  Prior to Admission medications    Medication Sig Start Date End Date Taking? Authorizing Provider   lisinopril 10 mg tablet Take 10 mg by mouth in the morning.   Yes Nurse Epic Emergency, RN         Total amount of time spent on day of service doing chart review, history and physical exam, order/ review results of testing ordered (if any), patient counseling, documentation: 30 minutes.      Patient encounter note may have been created using voice recognition software and in real time during the office visit. Please excuse any typographical errors that may not have been edited out.               Romona Curls, MD  09/15/21 [phone]

## 2021-11-11 ENCOUNTER — Ambulatory Visit: Admit: 2021-11-11 | Payer: MEDICARE

## 2021-11-11 ENCOUNTER — Encounter (INDEPENDENT_AMBULATORY_CARE_PROVIDER_SITE_OTHER)

## 2021-11-11 ENCOUNTER — Other Ambulatory Visit

## 2021-11-11 ENCOUNTER — Inpatient Hospital Stay
Admit: 2021-11-11 | Disposition: A | Discharge status: Home / Self Care | Payer: MEDICARE | Attending: Emergency Medicine

## 2021-11-11 VITALS — BP 133/92 | HR 85 | Temp 97.8°F | Resp 16 | Ht 62.0 in | Wt 165.0 lb

## 2021-11-11 DIAGNOSIS — M25561 Pain in right knee: Secondary | ICD-10-CM

## 2021-11-11 NOTE — ED Provider Notes (Signed)
History  Chief Complaint   Patient presents with   . Knee Pain     Pt c/o right knee swelling and pain x 2 days; pt states was playing with grandson and heard a pop in right knee, no other known injury     See MDM          Past Medical History:   Diagnosis Date   . Hypertension        Past Surgical History:   Procedure Laterality Date   . CHOLECYSTECTOMY     . HYSTERECTOMY         No family history on file.    Social History     Tobacco Use   . Smoking status: Never   . Smokeless tobacco: Never   Vaping Use   . Vaping status: Never Used   Substance Use Topics   . Alcohol use: Never   . Drug use: Never       Review of Systems   Constitutional: Negative for chills and fever.   HENT: Negative for rhinorrhea and sore throat.    Respiratory: Negative for cough.    Gastrointestinal: Negative for abdominal pain, diarrhea and vomiting.   Musculoskeletal: Positive for arthralgias and joint swelling. Negative for back pain and neck pain.       Physical Exam  Vitals:    11/11/21 1113   BP: (!) 133/92   Pulse: 85   Resp: 16   Temp: 36.6 C (97.8 F)   TempSrc: Oral   SpO2: 99%   Weight: 74.8 kg   Height: 1.575 m       Physical Exam  Vitals and nursing note reviewed.   Constitutional:       General: She is not in acute distress.     Appearance: Normal appearance. She is normal weight. She is not ill-appearing.   Musculoskeletal:      Comments: Right knee: There is an effusion present superior to the patella.  Tender to palpation over the medial joint line.  No tenderness over the lateral joint line.  Negative anterior and posterior drawer.  There is a pop felt laterally with McMurray testing.  No tenderness in the popliteal fossa.  No tenderness to the posterior calf.   Neurological:      Mental Status: She is alert.              No orders to display     Labs Reviewed - No data to display    Procedures  Procedures    UC Course  Diagnoses as of 11/11/21 1250   Acute pain of right knee   Tear of medial meniscus of right knee,  unspecified tear type, unspecified whether old or current tear, initial encounter       Medical Decision Making  Molly Jensen is a 71 year old woman who presents today for evaluation of right knee pain and swelling.  Symptoms began 2 days ago.  She was playing with her grandson and heard a pop in her knee.  Since then she has been having knee pain which is worse over the medial aspect of the knee.  It does radiate into the medial calf.  She also has some pain in the lateral distal thigh.  She has been told in the past she had a torn meniscus.  She was treated with a steroid injection for this with good relief of her symptoms.  This was approximately 2 years ago when she was living in [place].  She  denies any pain in any other joints.  There is been no fever or chills or any recent respiratory illness.    Physical exam is notable for pain over the medial joint line of the right knee.  There is a suprapatellar effusion present.  X-ray to my review shows degenerative changes.    Assessment/plan: Suspect meniscal etiology for her pain in light of her medial joint line tenderness and history of the same.  She has meloxicam at home.  She may continue to take this or ibuprofen as needed for pain.  Cane as needed.  Recommend she follow-up with orthopedics next week for evaluation.  She may benefit from a steroid injection.  Recommend over-the-counter Velcro knee brace.    Acute pain of right knee: complicated acute illness or injury  Amount and/or Complexity of Data Reviewed  Radiology: ordered and independent interpretation performed.          Discharge Meds  ED Prescriptions    None         Home Meds  Prior to Admission medications    Medication Sig Start Date End Date Taking? Authorizing Provider   lisinopril 10 mg tablet Take 10 mg by mouth in the morning.   Yes Nurse Epic Emergency, RN         Total amount of time spent on day of service doing chart review, history and physical exam, order/ review results of testing  ordered (if any), patient counseling, documentation: 25 minutes.      Patient encounter note may have been created using voice recognition software and in real time during the office visit. Please excuse any typographical errors that may not have been edited out.            Shelby Mattocks, MD  11/11/21 1251

## 2021-11-11 NOTE — Discharge Instructions (Addendum)
Your knee pain is likely due to a meniscal tear.  You do have significant arthritis on your x-ray.  Please follow-up with orthopedics next week.  Call their office on Monday morning to make an appointment.  You may take ibuprofen 200 mg tablets, 2 to 3 tablets 3 times daily as needed for pain.  Please elevate and ice the knee 3-4 times daily.  Limit walking and strenuous activity until better.  You can purchase an over-the-counter knee brace.  This should help reduce the pain with walking.

## 2021-11-16 NOTE — Telephone Encounter (Signed)
LVM that PT needs to reschedule...try to book with [facility]

## 2021-11-24 ENCOUNTER — Encounter: Payer: MEDICARE | Attending: Physician Assistant

## 2021-11-27 NOTE — Telephone Encounter (Signed)
 Patient cannot make 4/11 appt - LVM to reschedule. J.I.

## 2021-11-28 ENCOUNTER — Encounter: Payer: MEDICARE | Attending: Foot & Ankle Surgery

## 2021-12-04 ENCOUNTER — Ambulatory Visit: Admit: 2021-12-04 | Primary: Family Medicine

## 2021-12-04 ENCOUNTER — Ambulatory Visit: Admit: 2021-12-04 | Discharge: 2021-12-04 | Payer: MEDICARE | Attending: Orthopaedic Surgery | Primary: Family Medicine

## 2021-12-04 DIAGNOSIS — M25561 Pain in right knee: Secondary | ICD-10-CM

## 2021-12-04 DIAGNOSIS — M1711 Unilateral primary osteoarthritis, right knee: Secondary | ICD-10-CM

## 2021-12-04 MED ORDER — sodium hyaluronate (Hyalgan) injection 20 mg
10 | Freq: Once | INTRA_ARTICULAR | Status: AC | PRN
Start: 2021-12-04 — End: 2021-12-04
  Administered 2021-12-04: 14:00:00 20 mg via INTRA_ARTICULAR

## 2021-12-04 NOTE — Progress Notes (Signed)
[address]  [place], [place] [address]    [address]  [place], [place] [address]    Phone: [phone]  Fax:      [phone]  E-mail: [email or web address]        HPI  Molly Jensen is a 71 y.o. female who presents for right knee pain.  She reports dealing with knee pain for a year. She had a meniscal tear then got a cortisone shot. Then she was seen at urgent care which then lead her to a cortisone shot in March of 2023. She reports the knee is a 10/10 pain.  She notices the pain especially when walking.  She points to the inside of her knee as the source for her pain.  Exam  Active range of motion in the right knee is 0-130.  There is medial and lateral joint line tenderness.  There is full range of motion in the right hip with no discomfort.  There is no sign of an effusion.  There is full strength in the knee.  There is no instability.  Skin and sensation are intact.  There is intact capillary refill.      Ht Readings from Last 1 Encounters:   11/11/21 1.575 m     Wt Readings from Last 1 Encounters:   11/11/21 74.8 kg     BMI Readings from Last 1 Encounters:   11/11/21 30.18 kg/m         Diagnostic Tests  XR KNEE RIGHT 4+ VIEWS  Imaging Result: 12/04/2021     AP lateral sunrise and Zoila Shutter views of the right knee show severe   arthritic change with joint space narrowing and bone-on-bone contact in   the patellofemoral joint.     Impression: Severe osteoarthritis right knee      Assessment & Plan  We discussed treatment options including physical therapy, gel injections, assistive devices, and knee replacement.  We discussed the risks and benefits of each.  I have answered all her questions.  After discussion we decided to try Synojoynt injections.  She will follow-up next week for the second injection.    30 minutes was spent on this patient. This time may incude but is not limited to history and physical, reviewing test data, counseling on the patients condition and documenting in the  EMR.  L Inj/Asp: R knee on 12/04/2021 9:57 AM  Indications: pain  Details: 21 G needle, ultrasound-guided superolateral approach  Medications: 20 mg sodium hyaluronate 10 mg/mL    An ultrasound guided injection was delivered to the right knee using the [employer] ultrasound system with 4/12 MHz linear array.  After the appropriate counseling the anatomy was imaged with the knee extended fully.  Coupling gel was used.  The skin was prepped sterilely with chlorhexidine prep. Good flow was seen.  A Band-Aid was applied. The procedure was well tolerated.          1. Primary osteoarthritis of right knee    2. Right knee pain, unspecified chronicity      Primary osteoarthritis of right knee [M17.11]    Past Medical History:   Diagnosis Date   . Hypertension        Past Surgical History:   Procedure Laterality Date   . CHOLECYSTECTOMY     . HYSTERECTOMY         Social History     Occupational History   . Not on file   Tobacco Use   . Smoking status: Never   .  Smokeless tobacco: Never   Vaping Use   . Vaping status: Never Used   Substance and Sexual Activity   . Alcohol use: Never   . Drug use: Never   . Sexual activity: Defer       Current Outpatient Medications   Medication Instructions   . alendronate (Fosamax) 70 mg tablet 1 one tablet Orally every seven days   . ALPRAZolam (Xanax) 0.5 mg tablet Every 12 hours   . bisacodyl (Dulcolax) 10 mg suppository Every 24 hours   . colchicine 0.6 mg capsule 1 capsule   . fluocinolone (DermOtic) 0.01 % ear drops Every 12 hours   . lisinopril 10 mg, oral, Daily   . metFORMIN (Glucophage) 500 mg tablet Every 24 hours   . pantoprazole (ProtoNix) 40 mg EC tablet Every 24 hours       Allergies   Allergen Reactions   . Latex            Foye Clock, MD

## 2021-12-11 ENCOUNTER — Ambulatory Visit: Admit: 2021-12-11 | Discharge: 2021-12-13 | Payer: MEDICARE | Attending: Orthopaedic Surgery | Primary: Family Medicine

## 2021-12-11 ENCOUNTER — Ambulatory Visit: Primary: Family Medicine

## 2021-12-11 DIAGNOSIS — M1711 Unilateral primary osteoarthritis, right knee: Secondary | ICD-10-CM

## 2021-12-11 MED ORDER — sodium hyaluronate (Hyalgan) injection 20 mg
10 | Freq: Once | INTRA_ARTICULAR | Status: AC | PRN
Start: 2021-12-11 — End: 2021-12-11
  Administered 2021-12-11: 15:00:00 20 mg via INTRA_ARTICULAR

## 2021-12-11 NOTE — Progress Notes (Signed)
[address]  [place], [place] [address]    [address]  [place], [place] [address]    Phone: [phone]  Fax:      [phone]  E-mail: [email or web address]        HPI  Molly Jensen is a 71 y.o. female who presents for her second synojoynt injection in the right knee.     Exam        Ht Readings from Last 1 Encounters:   11/11/21 1.575 m     Wt Readings from Last 1 Encounters:   11/11/21 74.8 kg     BMI Readings from Last 1 Encounters:   11/11/21 30.18 kg/m         Diagnostic Tests      Assessment & Plan  She will follow-up next week for the second injection.    L Inj/Asp: R knee on 12/11/2021 10:53 AM  Indications: pain  Details: 21 G needle, ultrasound-guided superolateral approach  Medications: 20 mg sodium hyaluronate 10 mg/mL    An ultrasound guided injection was delivered to the right knee using the [employer] ultrasound system with 4/12 MHz linear array.  After the appropriate counseling the anatomy was imaged with the knee extended fully.  Coupling gel was used.  The skin was prepped sterilely with chlorhexidine prep. Good flow was seen.  A Band-Aid was applied. The procedure was well tolerated.          1. Primary osteoarthritis of right knee      Primary osteoarthritis of right knee [M17.11]    Past Medical History:   Diagnosis Date   . Hypertension        Past Surgical History:   Procedure Laterality Date   . CHOLECYSTECTOMY     . HYSTERECTOMY         Social History     Occupational History   . Not on file   Tobacco Use   . Smoking status: Never   . Smokeless tobacco: Never   Vaping Use   . Vaping status: Never Used   Substance and Sexual Activity   . Alcohol use: Never   . Drug use: Never   . Sexual activity: Defer       Current Outpatient Medications   Medication Instructions   . alendronate (Fosamax) 70 mg tablet 1 one tablet Orally every seven days   . ALPRAZolam (Xanax) 0.5 mg tablet Every 12 hours   . bisacodyl (Dulcolax) 10 mg suppository Every 24 hours   . colchicine 0.6 mg  capsule 1 capsule   . fluocinolone (DermOtic) 0.01 % ear drops Every 12 hours   . lisinopril 10 mg, oral, Daily   . metFORMIN (Glucophage) 500 mg tablet Every 24 hours   . pantoprazole (ProtoNix) 40 mg EC tablet Every 24 hours       Allergies   Allergen Reactions   . Latex            Foye Clock, MD

## 2021-12-21 ENCOUNTER — Ambulatory Visit: Admit: 2021-12-21 | Discharge: 2021-12-21 | Payer: MEDICARE | Attending: Orthopaedic Surgery | Primary: Family Medicine

## 2021-12-21 DIAGNOSIS — M1711 Unilateral primary osteoarthritis, right knee: Secondary | ICD-10-CM

## 2021-12-21 MED ORDER — sodium hyaluronate (Hyalgan) injection 20 mg
10 | Freq: Once | INTRA_ARTICULAR | Status: AC | PRN
Start: 2021-12-21 — End: 2021-12-21
  Administered 2021-12-21: 13:00:00 20 mg via INTRA_ARTICULAR

## 2021-12-21 NOTE — Progress Notes (Signed)
[address]  [place], [place] [address]    [address]  [place], [place] [address]    Phone: [phone]  Fax:      [phone]  E-mail: [email or web address]        HPI  Molly Jensen is a 71 y.o. female who presents for her third synojoynt injection in the right knee. She is interested in starting the series in her left knee today. She presents for her first synojoynt today.     Exam        Ht Readings from Last 1 Encounters:   11/11/21 1.575 m     Wt Readings from Last 1 Encounters:   11/11/21 74.8 kg     BMI Readings from Last 1 Encounters:   11/11/21 30.18 kg/m         Diagnostic Tests      Assessment & Plan  L Inj/Asp: L knee on 12/21/2021 8:46 AM  Indications: pain  Details: 21 G needle, ultrasound-guided superolateral approach  Medications: 20 mg sodium hyaluronate 10 mg/mL    An ultrasound guided injection was delivered using the [employer] ultrasound system with 4/12 MHz linear array.  After the appropriate counseling the anatomy was imaged.  Coupling gel was used.  The skin was prepped sterilely with chlorhexidine prep and anesthetized with ethyl chloride spray.  Good flow was seen.  A Band-Aid was applied. The procedure was well tolerated.    L Inj/Asp: R knee on 12/21/2021 8:46 AM  Indications: pain  Details: 21 G needle, ultrasound-guided superolateral approach  Medications: 20 mg sodium hyaluronate 10 mg/mL    An ultrasound guided injection was delivered to the right knee using the [employer] ultrasound system with 4/12 MHz linear array.  After the appropriate counseling the anatomy was imaged with the knee extended fully.  Coupling gel was used.  The skin was prepped sterilely with chlorhexidine prep. Good flow was seen.  A Band-Aid was applied. The procedure was well tolerated.          1. Primary osteoarthritis of right knee    2. Primary osteoarthritis of left knee      Primary osteoarthritis of right knee [M17.11]    Past Medical History:   Diagnosis Date   . Hypertension         Past Surgical History:   Procedure Laterality Date   . CHOLECYSTECTOMY     . HYSTERECTOMY         Social History     Occupational History   . Not on file   Tobacco Use   . Smoking status: Never   . Smokeless tobacco: Never   Vaping Use   . Vaping status: Never Used   Substance and Sexual Activity   . Alcohol use: Never   . Drug use: Never   . Sexual activity: Defer       Current Outpatient Medications   Medication Instructions   . alendronate (Fosamax) 70 mg tablet 1 one tablet Orally every seven days   . ALPRAZolam (Xanax) 0.5 mg tablet Every 12 hours   . bisacodyl (Dulcolax) 10 mg suppository Every 24 hours   . colchicine 0.6 mg capsule 1 capsule   . fluocinolone (DermOtic) 0.01 % ear drops Every 12 hours   . lisinopril 10 mg, oral, Daily   . metFORMIN (Glucophage) 500 mg tablet Every 24 hours   . pantoprazole (ProtoNix) 40 mg EC tablet Every 24 hours       Allergies  Allergen Reactions   . Latex            Molly ClockEvan Aiyannah Fayad, MD

## 2022-01-01 ENCOUNTER — Ambulatory Visit: Admit: 2022-01-01 | Discharge: 2022-01-01 | Payer: MEDICARE | Attending: Orthopaedic Surgery | Primary: Family Medicine

## 2022-01-01 DIAGNOSIS — M1712 Unilateral primary osteoarthritis, left knee: Secondary | ICD-10-CM

## 2022-01-01 MED ORDER — sodium hyaluronate (Hyalgan) injection 20 mg
10 | Freq: Once | INTRA_ARTICULAR | Status: AC | PRN
Start: 2022-01-01 — End: 2022-01-01
  Administered 2022-01-01: 15:00:00 20 mg via INTRA_ARTICULAR

## 2022-01-01 NOTE — Progress Notes (Signed)
[address]  [place], [place] [address]    [address]  [place], [place] [address]    Phone: [phone]  Fax:      [phone]  E-mail: [email or web address]        HPI  Molly Jensen is a 71 y.o. female who presents for left knee pain.  She is here for the second of three in the Synojoynt series.    Exam        Ht Readings from Last 1 Encounters:   11/11/21 1.575 m     Wt Readings from Last 1 Encounters:   11/11/21 74.8 kg     BMI Readings from Last 1 Encounters:   11/11/21 30.18 kg/m         Diagnostic Tests      Assessment & Plan  She will follow-up next week for the third injection.    L Inj/Asp: L knee on 01/01/2022 11:12 AM  Indications: pain  Details: 21 G needle, ultrasound-guided superolateral approach  Medications: 20 mg sodium hyaluronate 10 mg/mL    An ultrasound guided injection was delivered using the [employer] ultrasound system with 4/12 MHz linear array.  After the appropriate counseling the anatomy was imaged.  Coupling gel was used.  The skin was prepped sterilely with chlorhexidine prep and anesthetized with ethyl chloride spray.  Good flow was seen.  A Band-Aid was applied. The procedure was well tolerated.          1. Primary osteoarthritis of left knee      Primary osteoarthritis of left knee [M17.12]    Past Medical History:   Diagnosis Date   . Hypertension        Past Surgical History:   Procedure Laterality Date   . CHOLECYSTECTOMY     . HYSTERECTOMY         Social History     Occupational History   . Not on file   Tobacco Use   . Smoking status: Never   . Smokeless tobacco: Never   Vaping Use   . Vaping status: Never Used   Substance and Sexual Activity   . Alcohol use: Never   . Drug use: Never   . Sexual activity: Defer       Current Outpatient Medications   Medication Instructions   . alendronate (Fosamax) 70 mg tablet 1 one tablet Orally every seven days   . allopurinol (Zyloprim) 300 mg tablet allopurinol 300 mg tablet   . ALPRAZolam (Xanax) 0.5 mg tablet Every 12  hours   . baclofen (Lioresal) 10 mg tablet baclofen 10 mg tablet   . bisacodyl (Dulcolax) 10 mg suppository Every 24 hours   . citalopram (CeleXA) 20 mg tablet citalopram 20 mg tablet   . colchicine 0.6 mg capsule 1 capsule   . fluocinolone (DermOtic) 0.01 % ear drops Every 12 hours   . gabapentin (Neurontin) 100 mg capsule oral   . lisinopril 10 mg, oral, Daily   . metFORMIN (Glucophage) 500 mg tablet Every 24 hours   . pantoprazole (ProtoNix) 40 mg EC tablet Every 24 hours   . phentermine 15 mg capsule oral   . predniSONE (Deltasone) 10 mg tablet oral   . traMADol (Ultram) 50 mg tablet oral       Allergies   Allergen Reactions   . Latex            Foye Clock, MD

## 2022-01-08 ENCOUNTER — Ambulatory Visit: Admit: 2022-01-08 | Discharge: 2022-01-08 | Payer: MEDICARE | Attending: Orthopaedic Surgery | Primary: Family Medicine

## 2022-01-08 DIAGNOSIS — M1712 Unilateral primary osteoarthritis, left knee: Secondary | ICD-10-CM

## 2022-01-08 MED ORDER — sodium hyaluronate (Hyalgan) injection 20 mg
10 | Freq: Once | INTRA_ARTICULAR | Status: AC | PRN
Start: 2022-01-08 — End: 2022-01-08
  Administered 2022-01-08: 14:00:00 20 mg via INTRA_ARTICULAR

## 2022-01-08 NOTE — Progress Notes (Signed)
[address]  [place], [place] [id number]    [address]  [place], [place] [address]    Phone: [phone]  Fax:      [phone]  E-mail: [email or web address]        HPI  Molly Jensen is a 71 y.o. female who presents for her third synojoynt injection in left knee.  Exam        Ht Readings from Last 1 Encounters:   11/11/21 1.575 m     Wt Readings from Last 1 Encounters:   11/11/21 74.8 kg     BMI Readings from Last 1 Encounters:   11/11/21 30.18 kg/m         Diagnostic Tests      Assessment & Plan  She will follow-up in 4 weeks.    L Inj/Asp: L knee on 01/08/2022 9:57 AM  Indications: pain  Details: 21 G needle, ultrasound-guided superolateral approach  Medications: 20 mg sodium hyaluronate 10 mg/mL    An ultrasound guided injection was delivered using the [employer] Sparq ultrasound system with 4/12 MHz linear array.  After the appropriate counseling the anatomy was imaged.  Coupling gel was used.  The skin was prepped sterilely with chlorhexidine prep and anesthetized with ethyl chloride spray.  Good flow was seen.  A Band-Aid was applied. The procedure was well tolerated.              1. Primary osteoarthritis of left knee      Primary osteoarthritis of left knee [M17.12]    Past Medical History:   Diagnosis Date   . Hypertension        Past Surgical History:   Procedure Laterality Date   . CHOLECYSTECTOMY     . HYSTERECTOMY         Social History     Occupational History   . Not on file   Tobacco Use   . Smoking status: Never   . Smokeless tobacco: Never   Vaping Use   . Vaping status: Never Used   Substance and Sexual Activity   . Alcohol use: Never   . Drug use: Never   . Sexual activity: Defer       Current Outpatient Medications   Medication Instructions   . alendronate (Fosamax) 70 mg tablet 1 one tablet Orally every seven days   . allopurinol (Zyloprim) 300 mg tablet allopurinol 300 mg tablet   . ALPRAZolam (Xanax) 0.5 mg tablet Every 12 hours   . baclofen (Lioresal) 10 mg tablet baclofen 10  mg tablet   . bisacodyl (Dulcolax) 10 mg suppository Every 24 hours   . citalopram (CeleXA) 20 mg tablet citalopram 20 mg tablet   . colchicine 0.6 mg capsule 1 capsule   . fluocinolone (DermOtic) 0.01 % ear drops Every 12 hours   . gabapentin (Neurontin) 100 mg capsule oral   . lisinopril 10 mg, oral, Daily   . metFORMIN (Glucophage) 500 mg tablet Every 24 hours   . pantoprazole (ProtoNix) 40 mg EC tablet Every 24 hours   . phentermine 15 mg capsule oral   . predniSONE (Deltasone) 10 mg tablet oral   . traMADol (Ultram) 50 mg tablet oral       Allergies   Allergen Reactions   . Latex            Molly [name], MD

## 2022-02-05 ENCOUNTER — Ambulatory Visit: Admit: 2022-02-05 | Discharge: 2022-02-05 | Payer: MEDICARE | Attending: Orthopaedic Surgery | Primary: Family Medicine

## 2022-02-05 DIAGNOSIS — M1712 Unilateral primary osteoarthritis, left knee: Secondary | ICD-10-CM

## 2022-02-05 MED ORDER — triamcinolone acetonide (Kenalog-40) injection 80 mg
40 | Freq: Once | INTRAMUSCULAR | Status: AC | PRN
Start: 2022-02-05 — End: 2022-02-05
  Administered 2022-02-05: 13:00:00 80 mg via INTRA_ARTICULAR

## 2022-02-05 NOTE — Progress Notes (Signed)
[address]  [place], [place] [address]    [address]  [place], [place] [address]    Phone: [phone]  Fax:      [phone]  E-mail: [email or web address]        HPI  Shekira is a 71 y.o. female who presents for bilateral knee pain. She reports intermittent pain in the left knee. She states that she started noticing worsening pain in the left knee over the last few weeks.  She tells me it started after she twisted the knee.  She had synojoynt injections in both her knees and the right is doing very well.     Exam    Active range of motion in the left knee is 0-120.  She has both medial and lateral joint line tenderness.  There is a small effusion.  There is full strength in the knee.  There is no instability.  Skin and sensation are intact.  There is intact capillary refill.    X-rays done previously show significant arthritic change.  KL grade 4.    Ht Readings from Last 1 Encounters:   11/11/21 1.575 m     Wt Readings from Last 1 Encounters:   11/11/21 74.8 kg     BMI Readings from Last 1 Encounters:   11/11/21 30.18 kg/m         Diagnostic Tests      Assessment & Plan  We discussed treatment options including a cortisone injection, physical therapy, assistive devices, and knee replacement surgery.  We discussed the risks and we discussed the benefits.  I have answered all her questions.  After the discussion she has decided to try a cortisone injection of the left knee.  She will follow-up in 3 weeks.    20 minutes was spent on this patient. This time may incude but is not limited to history and physical, reviewing test data, counseling on the patients condition and documenting in the EMR.    L Inj/Asp: L knee on 02/05/2022 8:40 AM  Indications: pain  Details: 21 G needle, ultrasound-guided superolateral approach  Medications: 80 mg triamcinolone acetonide 40 mg/mL    An ultrasound guided injection was delivered using the [employer] ultrasound system with 4/12 MHz linear array.  After  the appropriate counseling the anatomy was imaged.  Coupling gel was used.  The skin was prepped sterilely with chlorhexidine prep.  3 cc of 1% lidocaine were included in the injectate. Good flow was seen.  A Band-Aid was applied. The procedure was well tolerated.          No diagnosis found.  No primary diagnosis found.    Past Medical History:   Diagnosis Date   . Hypertension        Past Surgical History:   Procedure Laterality Date   . CHOLECYSTECTOMY     . HYSTERECTOMY         Social History     Occupational History   . Not on file   Tobacco Use   . Smoking status: Never   . Smokeless tobacco: Never   Vaping Use   . Vaping Use: Never used   Substance and Sexual Activity   . Alcohol use: Never   . Drug use: Never   . Sexual activity: Defer       Current Outpatient Medications   Medication Instructions   . alendronate (Fosamax) 70 mg tablet 1 one tablet Orally every seven days   . allopurinol (Zyloprim) 300  mg tablet allopurinol 300 mg tablet   . ALPRAZolam (Xanax) 0.5 mg tablet Every 12 hours   . baclofen (Lioresal) 10 mg tablet baclofen 10 mg tablet   . bisacodyl (Dulcolax) 10 mg suppository Every 24 hours   . citalopram (CeleXA) 20 mg tablet citalopram 20 mg tablet   . colchicine 0.6 mg capsule 1 capsule   . fluocinolone (DermOtic) 0.01 % ear drops Every 12 hours   . gabapentin (Neurontin) 100 mg capsule oral   . lisinopril 10 mg, oral, Daily   . metFORMIN (Glucophage) 500 mg tablet Every 24 hours   . pantoprazole (ProtoNix) 40 mg EC tablet Every 24 hours   . phentermine 15 mg capsule oral   . predniSONE (Deltasone) 10 mg tablet oral   . traMADol (Ultram) 50 mg tablet oral       Allergies   Allergen Reactions   . Latex            Duard Larsen, [place]

## 2022-02-26 ENCOUNTER — Ambulatory Visit: Admit: 2022-02-26 | Discharge: 2022-02-26 | Payer: MEDICARE | Attending: Orthopaedic Surgery | Primary: Family Medicine

## 2022-02-26 DIAGNOSIS — M1712 Unilateral primary osteoarthritis, left knee: Secondary | ICD-10-CM

## 2022-02-26 NOTE — Progress Notes (Signed)
[address]  [place], [place] [address]    [address]  [place], [place] [address]    Phone: [phone]  Fax:      [phone]  E-mail: [email or web address]        HPI  Molly Jensen is a 71 y.o. female who presents for bilateral knee pain. X-rays done previously show significant arthritic change. She was given a left knee cortisone on 02/05/22 with some relief. She has occasional soreness in the knee. No other complaints today.  She tells me that she continues to have difficulty walking for exercise.  Her left is more painful than the right.    Exam    Active range of motion in the left knee is 0-120.  She has both medial and lateral joint line tenderness.  There is a small effusion.  There is full strength in the knee.  There is no instability.  Skin and sensation are intact.  There is intact capillary refill.    X-rays done previously show significant arthritic change.  KL grade 4.        Ht Readings from Last 1 Encounters:   11/11/21 1.575 m     Wt Readings from Last 1 Encounters:   11/11/21 74.8 kg     BMI Readings from Last 1 Encounters:   11/11/21 30.18 kg/m         Diagnostic Tests      Assessment & Plan  We discussed treatment options including stem cell injections, PRP, and specifically knee replacement surgery.  We went over the risks and benefits of each.  I have answered all her questions.  She will call me if she decides to proceed on any of the above interventions.    30 minutes was spent on this patient. This time may incude but is not limited to history and physical, reviewing test data, counseling on the patients condition and documenting in the EMR.      1. Primary osteoarthritis of left knee    2. Primary osteoarthritis of right knee      Primary osteoarthritis of left knee [M17.12]    Past Medical History:   Diagnosis Date   . Hypertension        Past Surgical History:   Procedure Laterality Date   . CHOLECYSTECTOMY     . HYSTERECTOMY         Social History     Occupational  History   . Not on file   Tobacco Use   . Smoking status: Never   . Smokeless tobacco: Never   Vaping Use   . Vaping Use: Never used   Substance and Sexual Activity   . Alcohol use: Never   . Drug use: Never   . Sexual activity: Defer       Current Outpatient Medications   Medication Instructions   . alendronate (Fosamax) 70 mg tablet 1 one tablet Orally every seven days   . allopurinol (Zyloprim) 300 mg tablet allopurinol 300 mg tablet   . ALPRAZolam (Xanax) 0.5 mg tablet Every 12 hours   . baclofen (Lioresal) 10 mg tablet baclofen 10 mg tablet   . bisacodyl (Dulcolax) 10 mg suppository Every 24 hours   . citalopram (CeleXA) 20 mg tablet citalopram 20 mg tablet   . colchicine 0.6 mg capsule 1 capsule   . fluocinolone (DermOtic) 0.01 % ear drops Every 12 hours   . gabapentin (Neurontin) 100 mg capsule oral   . lisinopril 10 mg, oral, Daily   .  metFORMIN (Glucophage) 500 mg tablet Every 24 hours   . pantoprazole (ProtoNix) 40 mg EC tablet Every 24 hours   . phentermine 15 mg capsule oral   . predniSONE (Deltasone) 10 mg tablet oral   . traMADol (Ultram) 50 mg tablet oral       Allergies   Allergen Reactions   . Latex            Foye ClockEvan Shela Esses, MD

## 2022-04-16 ENCOUNTER — Ambulatory Visit: Admit: 2022-04-16 | Discharge: 2022-04-16 | Payer: MEDICARE | Attending: Orthopaedic Surgery | Primary: Family Medicine

## 2022-04-16 DIAGNOSIS — M1711 Unilateral primary osteoarthritis, right knee: Secondary | ICD-10-CM

## 2022-04-16 MED ORDER — triamcinolone acetonide (Kenalog-40) injection 80 mg
40 | Freq: Once | INTRAMUSCULAR | Status: AC | PRN
Start: 2022-04-16 — End: 2022-04-16
  Administered 2022-04-16: 14:00:00 80 mg via INTRA_ARTICULAR

## 2022-04-16 NOTE — Progress Notes (Signed)
[address]  [place], [place] [address]    [address]  [place], [place] [address]    Phone: [phone]  Fax:      [phone]  E-mail: [email or web address]        HPI  Molly Jensen is a 71 y.o. female who presents for bilateral knee pain. X-rays done previously show significant arthritic change. She was given a left knee cortisone on 02/05/22 with some relief. She has occasional soreness in the knee. No other complaints today.  She tells me that she continues to have difficulty walking for exercise.  Her left is more painful than the right.    Exam    Active range of motion in the left knee is 0-120.  She has both medial and lateral joint line tenderness.  There is a small effusion.  There is full strength in the knee.  There is no instability.  Skin and sensation are intact.  There is intact capillary refill.    X-rays done previously show significant arthritic change.  KL grade 4.        Ht Readings from Last 1 Encounters:   11/11/21 1.575 m     Wt Readings from Last 1 Encounters:   11/11/21 74.8 kg     BMI Readings from Last 1 Encounters:   11/11/21 30.18 kg/m         Diagnostic Tests      Assessment & Plan  We discussed treatment options including stem cell injections, PRP, and specifically knee replacement surgery.  We went over the risks and benefits of each.  I have answered all her questions.  She will call me if she decides to proceed on any of the above interventions.    30 minutes was spent on this patient. This time may incude but is not limited to history and physical, reviewing test data, counseling on the patients condition and documenting in the EMR.      1. Primary osteoarthritis of right knee      Primary osteoarthritis of right knee [M17.11]    Past Medical History:   Diagnosis Date   . Hypertension        Past Surgical History:   Procedure Laterality Date   . CHOLECYSTECTOMY     . HYSTERECTOMY         Social History     Occupational History   . Not on file   Tobacco Use   .  Smoking status: Never   . Smokeless tobacco: Never   Vaping Use   . Vaping Use: Never used   Substance and Sexual Activity   . Alcohol use: Never   . Drug use: Never   . Sexual activity: Defer       Current Outpatient Medications   Medication Instructions   . alendronate (Fosamax) 70 mg tablet 1 one tablet Orally every seven days   . allopurinol (Zyloprim) 300 mg tablet allopurinol 300 mg tablet   . ALPRAZolam (Xanax) 0.5 mg tablet Every 12 hours   . baclofen (Lioresal) 10 mg tablet baclofen 10 mg tablet   . bisacodyl (Dulcolax) 10 mg suppository Every 24 hours   . citalopram (CeleXA) 20 mg tablet citalopram 20 mg tablet   . colchicine 0.6 mg capsule 1 capsule   . fluocinolone (DermOtic) 0.01 % ear drops Every 12 hours   . gabapentin (Neurontin) 100 mg capsule oral   . lisinopril 10 mg, oral, Daily   . metFORMIN (Glucophage) 500 mg tablet Every  24 hours   . pantoprazole (ProtoNix) 40 mg EC tablet Every 24 hours   . phentermine 15 mg capsule oral   . predniSONE (Deltasone) 10 mg tablet oral   . traMADol (Ultram) 50 mg tablet oral       Allergies   Allergen Reactions   . Latex            Foye Clock, MD [address]  [place], [place] [address]    [address]  [place], [place] [address]    Phone: [phone]  Fax:      [phone]  E-mail: [email or web address]        HPI  Molly Jensen is a 71 y.o. female who presents for bilateral knee pain. She had a left knee cortisone 02/05/22 and a right knee cortisone 01/21/22 with relief. She has constant soreness and takes Aleve with relief. 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     Exam    Active range of motion in knees is 0-1 20.  She has both medial and lateral joint line tenderness.  There is a small effusion.  There is full strength in the knee.  There is no instability.  Skin and sensation are intact.  There is intact capillary refill.    X-rays done previously show significant arthritic change.  KL grade  4.        Ht Readings from Last 1 Encounters:   11/11/21 1.575 m     Wt Readings from Last 1 Encounters:   11/11/21 74.8 kg     BMI Readings from Last 1 Encounters:   11/11/21 30.18 kg/m         Diagnostic Tests      Assessment & Plan  we discussed treatment options and specifically discussed knee replacement surgery.  We went over the risks, benefits and the recovery.  I have answered all her questions.  She is she is traveling to [place] in the next few weeks for the next month we decided to try a cortisone injection in the right knee.    30 minutes was spent on this patient. This time may incude but is not limited to history and physical, reviewing test data, counseling on the patients condition and documenting in the EMR.  L Inj/Asp: R knee on 04/16/2022 9:43 AM  Indications: pain  Details: 21 G needle, ultrasound-guided superolateral approach  Medications: 80 mg triamcinolone acetonide 40 mg/mL    An ultrasound guided injection was delivered using the [employer] ultrasound system with 4/12 MHz linear array.  After the appropriate counseling the anatomy was imaged.  Coupling gel was used.  The skin was prepped sterilely with chlorhexidine prep.  3 cc of 1% lidocaine were included in the injectate. Good flow was seen.  A Band-Aid was applied. The procedure was well tolerated.        1. Primary osteoarthritis of right knee      Primary osteoarthritis of right knee [M17.11]    Past Medical History:   Diagnosis Date   . Hypertension        Past Surgical History:   Procedure Laterality Date   . CHOLECYSTECTOMY     . HYSTERECTOMY         Social History     Occupational History   . Not on file   Tobacco Use   . Smoking status: Never   . Smokeless tobacco: Never   Vaping Use   .  Vaping Use: Never used   Substance and Sexual Activity   . Alcohol use: Never   . Drug use: Never   . Sexual activity: Defer       Current Outpatient Medications   Medication Instructions   . alendronate (Fosamax) 70 mg tablet 1 one tablet  Orally every seven days   . allopurinol (Zyloprim) 300 mg tablet allopurinol 300 mg tablet   . ALPRAZolam (Xanax) 0.5 mg tablet Every 12 hours   . baclofen (Lioresal) 10 mg tablet baclofen 10 mg tablet   . bisacodyl (Dulcolax) 10 mg suppository Every 24 hours   . citalopram (CeleXA) 20 mg tablet citalopram 20 mg tablet   . colchicine 0.6 mg capsule 1 capsule   . fluocinolone (DermOtic) 0.01 % ear drops Every 12 hours   . gabapentin (Neurontin) 100 mg capsule oral   . lisinopril 10 mg, oral, Daily   . metFORMIN (Glucophage) 500 mg tablet Every 24 hours   . pantoprazole (ProtoNix) 40 mg EC tablet Every 24 hours   . phentermine 15 mg capsule oral   . predniSONE (Deltasone) 10 mg tablet oral   . traMADol (Ultram) 50 mg tablet oral       Allergies   Allergen Reactions   . Latex            Foye Clock, MD

## 2022-09-06 NOTE — Telephone Encounter (Signed)
Molly Jensen!    Patient would like to cancel her Pre-op.     Call back: [phone]    Thanks!

## 2022-09-26 NOTE — Telephone Encounter (Signed)
Molly Jensen,    Patient requested to cancel all her surgeries related appointment. She's not ready and will give [place] a call once good to schedule surgeries. Callback number [phone].    Thank you.    Nam

## 2022-09-27 NOTE — Telephone Encounter (Signed)
Patient is not ready at this time for surgery -SK

## 2022-10-01 ENCOUNTER — Encounter: Payer: MEDICARE | Primary: Family Medicine

## 2022-10-04 ENCOUNTER — Ambulatory Visit: Payer: MEDICARE | Primary: Family Medicine

## 2022-10-29 ENCOUNTER — Encounter: Payer: MEDICARE | Primary: Family Medicine

## 2023-01-07 ENCOUNTER — Ambulatory Visit: Admit: 2023-01-07 | Discharge: 2023-01-07 | Payer: MEDICARE | Attending: Orthopaedic Surgery | Primary: Family Medicine

## 2023-01-07 ENCOUNTER — Encounter: Admit: 2023-01-07 | Primary: Family Medicine

## 2023-01-07 DIAGNOSIS — G8929 Other chronic pain: Secondary | ICD-10-CM

## 2023-01-07 DIAGNOSIS — M1711 Unilateral primary osteoarthritis, right knee: Secondary | ICD-10-CM

## 2023-01-07 MED ORDER — methylPREDNISolone acetate (DEPO-Medrol) injection 80 mg
40 | Freq: Once | INTRAMUSCULAR | Status: AC | PRN
Start: 2023-01-07 — End: 2023-01-07
  Administered 2023-01-07: 14:00:00 80 mg via INTRA_ARTICULAR

## 2023-01-07 NOTE — Progress Notes (Signed)
[address]  [place], [place] [address]    [address]  [place], [place] [address]    Phone: [phone]  Fax:      [phone]  E-mail: [email or web address]        HPI  Molly Jensen is a 72 y.o. female who presents for bilateral knee pain. She has had cortisone and gel in the past with some relief. Most recently, she has constant pain that intermittent radiates down the legs. She takes over the counter pain management with some relief. She denies numbness and tingling. She has intermittent swelling localized to the knees.      Exam  Range of motion in both knees is 0-125.  There is medial and lateral joint line tenderness in both knees.  There is a moderate effusion in both knees.  There is full strength.  There is no instability.      Ht Readings from Last 1 Encounters:   11/11/21 1.575 m     Wt Readings from Last 1 Encounters:   11/11/21 74.8 kg     BMI Readings from Last 1 Encounters:   11/11/21 30.18 kg/m         Diagnostic Tests  XR KNEE BILATERAL 4+ VIEWS  Imaging Result: 01/07/2023    AP lateral sunrise and Zoila Shutter views of both knees show severe arthritic   change with osteophyte formation and joint space narrowing.    Impression: Osteoarthritis bilateral knees      Assessment & Plan    We discussed treatment options and she would like to hold off on surgery at this time.  We decided to try bilateral cortisone injections.  She will also try some physical therapy.  She will follow-up with me if her pain persists.    L Inj/Asp: L knee  Indications: pain  Details: 21 G needle, ultrasound-guided superolateral approach  Medications: 80 mg methylPREDNISolone acetate 40 mg/mL    An ultrasound guided injection was delivered using the [employer] ultrasound system with 4/12 MHz linear array.  After the appropriate counseling the anatomy was imaged.  Coupling gel was used.  The skin was prepped sterilely with chlorhexidine prep.  3 cc of 1% lidocaine were included in the injectate. Good flow was  seen.  A Band-Aid was applied. The procedure was well tolerated.    L Inj/Asp: R knee  Indications: pain  Details: 21 G needle, ultrasound-guided superolateral approach  Medications: 80 mg methylPREDNISolone acetate 40 mg/mL    An ultrasound guided injection was delivered using the [employer] ultrasound system with 4/12 MHz linear array.  After the appropriate counseling the anatomy was imaged.  Coupling gel was used.  The skin was prepped sterilely with chlorhexidine prep.  3 cc of 1% lidocaine were included in the injectate. Good flow was seen.  A Band-Aid was applied. The procedure was well tolerated.        1. Primary osteoarthritis of right knee    2. Chronic pain of both knees    3. Primary osteoarthritis of left knee      Primary osteoarthritis of right knee [M17.11]    Past Medical History:   Diagnosis Date   . Hypertension        Past Surgical History:   Procedure Laterality Date   . CHOLECYSTECTOMY     . HYSTERECTOMY         Social History     Occupational History   . Not on file   Tobacco Use   .  Smoking status: Never   . Smokeless tobacco: Never   Vaping Use   . Vaping Use: Never used   Substance and Sexual Activity   . Alcohol use: Never   . Drug use: Never   . Sexual activity: Defer       Current Outpatient Medications   Medication Instructions   . alendronate (Fosamax) 70 mg tablet 1 one tablet Orally every seven days   . allopurinol (Zyloprim) 300 mg tablet allopurinol 300 mg tablet   . ALPRAZolam (Xanax) 0.5 mg tablet Every 12 hours   . baclofen (Lioresal) 10 mg tablet baclofen 10 mg tablet   . bisacodyl (Dulcolax) 10 mg suppository Every 24 hours   . citalopram (CeleXA) 20 mg tablet citalopram 20 mg tablet   . colchicine 0.6 mg capsule 1 capsule   . fluocinolone (DermOtic) 0.01 % ear drops Every 12 hours   . gabapentin (Neurontin) 100 mg capsule oral   . lisinopril 10 mg, oral, Daily   . metFORMIN (Glucophage) 500 mg tablet Every 24 hours   . pantoprazole (ProtoNix) 40 mg EC tablet Every 24  hours   . phentermine 15 mg capsule oral   . predniSONE (Deltasone) 10 mg tablet oral   . traMADol (Ultram) 50 mg tablet oral       Allergies   Allergen Reactions   . Latex            Foye Clock, MD

## 2023-01-22 ENCOUNTER — Encounter
Admit: 2023-01-22 | Discharge: 2023-01-22 | Payer: MEDICARE | Attending: Rehabilitative and Restorative Service Providers" | Primary: Family Medicine

## 2023-01-22 DIAGNOSIS — M1711 Unilateral primary osteoarthritis, right knee: Secondary | ICD-10-CM

## 2023-01-22 NOTE — Plan of Care (Signed)
Outpatient Physical Therapy Plan of Care    Patient Name: Molly Jensen  MRN: [id number]  DOB: 1951/03/20  Initial Eval Date:  01/22/2023  M17.11  M17.12 Primary osteoarthritis of right knee  Primary osteoarthritis of left knee    Assessment:  Patient presents with mod pain levels, min decreased AROM B Knee exten, slight weakness B Knee exten, min weakness B ANkle PF, min/mod altered R LE proprioception, min/mod decreased flexibility and apparent min/,mod edema B Knees. Patient should benefit from skilled PT for resolution of above objective, functional limitations. Patient educated on proper home edema management and instructed in HEP of B Knee Ext AROM-Strengthening and B gastroc/B Quad stretching.    Assessment  Rehab Potential: Good    Goals:  Active        Patient will be able to ambulate with min pain/difficulty       Start:  01/22/23    Expected End:  02/26/23             Patient will be abel to stand for prolonged periods with min pain/diffiulty        Start:  01/22/23    Expected End:  02/26/23             patient will be able to perform all transfers with min pain/difficulty        Start:  01/22/23    Expected End:  02/26/23             patient will be able to ascend-descend stairs reciprocally with mod pain/difficulty        Start:  01/22/23    Expected End:  02/26/23             Decrease pain levels to a 0-2/10       Start:  01/22/23    Expected End:  02/12/23             INcrease AROM B Knee ext 3 , L gastroc length 4       Start:  01/22/23    Expected End:  02/12/23             INcrease strength B Knee extto 5/5, B Ankle PF 1/2 grade        Start:  01/22/23    Expected End:  02/12/23            Decrease edema B Knees by 1/4 in. throughout        Start:  01/22/23    Expected End:  02/12/23               Plan  Plan  PT Frequency: 2 x/week  Duration: 6 weeks  Treatment Plan: Therapeutic Exercise (97110), Manual Techniques (97140), Therapeutic Activity (97530), Neuromuscular Reeducation (54098), Postural  Education, Restaurant manager, fast food, Investment banker, operational (11914), Balance Training, Home Exercise Program, Hot/Cold Pack, Statistician, Utrasound [phone])    Tyrone Nine, PT  [place] lic # [id number]

## 2023-01-22 NOTE — Progress Notes (Signed)
Physical Therapy Evaluation    Patient Name: Molly Jensen  MRN: [id number]  DOB: 1951-05-31  Today's Date: 01/22/2023  Referral Diagnosis: M17.11  M17.12 Primary osteoarthritis of right knee  Primary osteoarthritis of left knee    Subjective   History of Present Condition/Mechanism of Injury: Patient is a 72 y.o. female who presents with B Knee pain for past 16 months or so. Xrays taken B Knees on 01/07/23 reveals severe arthritic changes with osteophyte formation. Patient received Cortisone injections in B Knees on 01/07/23 with report of no relief        Pertinent Past Medical History/Surgical History:            General Visit Information:  Interpreter Services  Is an interpreter used? : N    Prior Level of Function:  Prior Level of Function  Prior Level of Function: Patient was independent with all functional activities prior to this injury    Current Level of Function:  Self Care: min limitation  Transitional Mobility: mod limitation  Walking: Mod limitation  Stairs: Descend stairs- non-reciprocally going sidwways, Ascend- NOn- reipcrocally  Sleeping: mod limitation  Prolonged Standing: Mod limitation  Occupation: Retired    Surveyor, minerals:       Pain  Pain Assessment  Pain Assessment: 0-10  Pain Score:  (4-  8/10)  Pain Type: Chronic pain  Pain Location: Knee  Pain Orientation: Left, Right                  Objective   Posture/Observation       Functional Mobility   OP PT General Evaluation       Row Name 01/22/23 0900          Gait Assessment    Gait Assessment --  MIn decreased B knee exten/B heel strike                     Functional Assessment       Range of Motion                 Knee AROM PROM    Right Left Right  Left   Flexion WNL WNL        Extension - 5 -5           Strength          Hip Right Left   Flexion 5/5 5/5   Extension 5/5 5/5   ABduction 5/5 5/5   Adduction 5/5 5/5   Internal Rotation       External Rotation           Knee Right Left   Flexion 5/5 5/5   Extension  (4 to 4+/5)  (4 to 4+/5)        Ankle & Foot Right Left   Dorsiflexion 5/5 5/5   Plantar Flexion 4-/5 4/5   Inversion       Eversion       Great Toe Extension       Great Toe Flexion           Palpation  Palpation/Inspection  Palpation/Inspection: Mod tender B Distal IT bands/L Distal vastus laterallis  Girth : Mid-Patella: L- 15 in, 1 in. distal- 14 3/4 in, R Mid-Patella: 15 in, 1 in. distal- 14 3/4 in    Balance  Balance  Comments: SLS: L- 9 seconds, R- 4 secondfs    Neuro-Vascular  Examination  Knee Examination  Ely's Test:  (L- mod +, R- min +)  90/90 Hamstring:  (SLR: L- 80, R- 78)                    Outcome Measures       OP PT General Evaluation       Row Name 01/22/23 0900          KOOS Jr.    STIFFNESS: How severe is your knee stiffness after first wakening in the morning? 1     PAIN: Twisting/pivoting on your knee 2     PAIN: Straightening knee fully 1     PAIN: Going up or down stairs 3     PAIN: Standing upright 2     FUNCTION, DAILY LIVING: Rising from sitting 2     FUNCTION, DAILY LIVING: Bending to floor/pick up an object 2     KOOS Jr. Raw Score 13     KOOS Jr. Interval (0 = total knee disability, and 100 = perfect knee health) 54.84                      Assessment   Assessment:  Patient presents with mod pain levels, min decreased AROM B Knee exten, slight weakness B Knee exten, min weakness B ANkle PF, min/mod altered R LE proprioception, min/mod decreased flexibility and apparent min/,mod edema B Knees. Patient should benefit from skilled PT for resolution of above objective, functional limitations. Patient educated on proper home edema management and instructed in HEP of B Knee Ext AROM-Strengthening and B gastroc/B Quad stretching.    Assessment  Rehab Potential: Good    Goals:  Active        Patient will be able to ambulate with min pain/difficulty       Start:  01/22/23    Expected End:  02/26/23             Patient will be abel to stand for prolonged periods with min pain/diffiulty         Start:  01/22/23    Expected End:  02/26/23             patient will be able to perform all transfers with min pain/difficulty        Start:  01/22/23    Expected End:  02/26/23             patient will be able to ascend-descend stairs reciprocally with mod pain/difficulty        Start:  01/22/23    Expected End:  02/26/23             Decrease pain levels to a 0-2/10       Start:  01/22/23    Expected End:  02/12/23             INcrease AROM B Knee ext 3 , L gastroc length 4       Start:  01/22/23    Expected End:  02/12/23             INcrease strength B Knee extto 5/5, B Ankle PF 1/2 grade        Start:  01/22/23    Expected End:  02/12/23            Decrease edema B Knees by 1/4 in. throughout        Start:  01/22/23    Expected End:  02/12/23  Plan   Plan  Plan  PT Frequency: 2 x/week  Duration: 6 weeks  Treatment Plan: Therapeutic Exercise (97110), Manual Techniques (86578), Therapeutic Activity [phone]), Neuromuscular Reeducation [phone]), Postural Education, Patient/Family Education, Investment banker, operational (13244), Balance Training, Home Exercise Program, Hot/Cold Pack, Statistician, Utrasound [phone])    Therapy Time  PT Evaluation Time Entry  PT Evaluation (Moderate) Time Entry: 45  PT Modalities Time Entry  Hot/Cold Pack Time Entry: 10    Tyrone Nine, PT  [place] lic # [id number]

## 2023-01-29 ENCOUNTER — Encounter: Payer: MEDICARE | Primary: Family Medicine

## 2023-01-31 ENCOUNTER — Encounter: Admit: 2023-01-31 | Discharge: 2023-01-31 | Payer: MEDICARE | Primary: Family Medicine

## 2023-01-31 DIAGNOSIS — M1711 Unilateral primary osteoarthritis, right knee: Secondary | ICD-10-CM

## 2023-01-31 NOTE — Progress Notes (Signed)
Physical Therapy Visit Treatment Note    Patient Name: Molly Jensen  MRN: [id number]  DOB: [birth year]  Today's Date: 01/31/2023  Initial Eval Date:  01/22/2023  Referral Diagnosis:  M17.11  M17.12  Primary osteoarthritis of right knee  Primary osteoarthritis of left knee    Subjective   Subjective: "They're both worn out"     Objective   Objective:      OP PT Treatment - Thu January 31, 2023       Row Name 0715       Interpreter Services    Is an interpreter used?  N       Pain Assessment    Pain Assessment 0-10    Pain Type Chronic pain    Pain Location Knee    Pain Orientation Left;Right       Objective Measurements    Palpation/Inspection Mod tender B Distal IT bands/L Distal vastus laterallis    Comments SLS: L- 9 seconds, R- 4 secondfs       Therapeutic Exercise    Exercise BIke    Exercise B TKE's GTB 2x10    Exercise Mini Squats x10    Exercise Bridges with Hip Abd 3x10    Exercise B Quad stretch prone sith strap 3x30"    Exercise B gastroc stretch 3x30"    Exercise B hamstring stretch 3x30"       Manual Therapy    Manual Therapy Soft tissue mobilizations B Distal IT band       Modalities    Cryotherapy (Minutes\Location) x 10 mins with elevation x 10 mins B Knees                    Assessment   Assessment: L>R distal ITB restrictions w/ STM. Pt able to walk approximately one mile after performing HEP. Good tolerance to initial therex program w/ no increase in pain levels, minimal clicking w/ TKE upon return to start position. Continue following current plan of care to strengthen B LE musculature and improve mobility levels.     Plan   Plan: Plan to continue per current POC to restore functional strength and mobility.     Therapy Time  PT Therapeutic Procedures Time Entry  Manual Therapy Time Entry: 12  Therapeutic Exercise Time Entry: [address], PTA  [place] lic # [id number]

## 2023-02-04 ENCOUNTER — Encounter: Admit: 2023-02-04 | Discharge: 2023-02-04 | Payer: MEDICARE | Primary: Family Medicine

## 2023-02-04 DIAGNOSIS — M1712 Unilateral primary osteoarthritis, left knee: Secondary | ICD-10-CM

## 2023-02-04 NOTE — Progress Notes (Signed)
Physical Therapy Visit Treatment Note    Patient Name: Molly Jensen  MRN: [id number]  DOB: Jul 05, 1951  Today's Date: 02/04/2023  Initial Eval Date:  01/22/2023  Referral Diagnosis:  M17.11  M17.12  Primary osteoarthritis of right knee  Primary osteoarthritis of left knee    Subjective   Subjective: Pt reports she gets soreness every time after she goes for a walk.     Objective   Objective:      OP PT Treatment - Mon February 04, 2023       Row Name 0700       Interpreter Services    Is an interpreter used?  N       Pain Assessment    Pain Assessment 0-10    Pain Location Knee    Pain Orientation Left;Right       Objective Measurements    Palpation/Inspection Mod tender B Distal IT bands/L Distal vastus laterallis    Comments --       Therapeutic Exercise    Exercise BIke x50min    Exercise B TKE's GTB 2x10    Exercise Mini Squats x10    Exercise Bridges with Hip Abd 3x10    Exercise B Quad stretch prone sith strap 3x30"    Exercise B gastroc stretch 3x30"    Exercise B hamstring stretch 3x30"       Manual Therapy    Manual Therapy Soft tissue mobilizations B Distal IT band       Modalities    Cryotherapy (Minutes\Location) x 10 mins with elevation x 10 mins B Knees                    Assessment   Assessment: Pt presents to PT c/o soreness after walking approximately 1 hour--pt unsure of exact distance. Educated patient to try and modify walking route to avoid excessive incline/decline and uneven terrain. Pt demonstrates good understanding w/ this. Also encouraged performance of stretches to promote soft tissue extensibility and reduce stiffness. Introduced recumbent bike today which Mitra did well with.     Plan   Plan: Plan to continue per current POC to restore functional strength and mobility.     Therapy Time  PT Therapeutic Procedures Time Entry  Manual Therapy Time Entry: 12  Therapeutic Exercise Time Entry: [address], PTA  [place] lic # [id number]

## 2023-02-07 ENCOUNTER — Encounter: Admit: 2023-02-07 | Discharge: 2023-02-07 | Payer: MEDICARE | Primary: Family Medicine

## 2023-02-07 DIAGNOSIS — M1712 Unilateral primary osteoarthritis, left knee: Secondary | ICD-10-CM

## 2023-02-07 NOTE — Progress Notes (Signed)
Physical Therapy Visit Treatment Note    Patient Name: Chanah Tidmore  MRN: [id number]  DOB: Sep 29, 1950  Today's Date: 02/07/2023  Initial Eval Date:  01/22/2023  Referral Diagnosis:  M17.11  M17.12  Primary osteoarthritis of right knee  Primary osteoarthritis of left knee    Subjective   Subjective: "Both knees feel exactly the same."    Objective   Objective:      OP PT Treatment - Thu February 07, 2023       Row Name 0700       Interpreter Services    Is an interpreter used?  N       Pain Assessment    Pain Assessment 0-10       Objective Measurements    Palpation/Inspection Mod tender B Distal IT bands/L Distal vastus laterallis       Therapeutic Exercise    Exercise BIke x76min    Exercise B TKE's GTB 2x10    Exercise Mini Squats x10    Exercise Bridges with Hip Abd 3x10    Exercise B Quad stretch prone sith strap 3x30"    Exercise B gastroc stretch 3x30"    Exercise B hamstring stretch 3x30"       Manual Therapy    Manual Therapy Soft tissue mobilizations B Distal IT band       Modalities    Cryotherapy (Minutes\Location) x 10 mins with elevation x 10 mins B Knees                    Assessment   Assessment: Lovell tolerated tx well today. She notes both knees are equal in respect to pain levels. Same level of sensitivity on B ITB during rolling.     Plan   Plan: Cont per PT POC     Therapy Time  PT Therapeutic Procedures Time Entry  Manual Therapy Time Entry: 12  Therapeutic Exercise Time Entry: 30    Reginold Agent, PTA  [place] lic # [id number]

## 2023-02-11 ENCOUNTER — Encounter: Admit: 2023-02-11 | Discharge: 2023-02-11 | Payer: MEDICARE | Primary: Family Medicine

## 2023-02-11 DIAGNOSIS — M1712 Unilateral primary osteoarthritis, left knee: Secondary | ICD-10-CM

## 2023-02-11 NOTE — Progress Notes (Signed)
Physical Therapy Visit Treatment Note    Patient Name: Molly Jensen  MRN: [id number]  DOB: 1951-07-26  Today's Date: 02/11/2023  Initial Eval Date:  01/22/2023  Referral Diagnosis:  M17.11  M17.12  Primary osteoarthritis of right knee  Primary osteoarthritis of left knee    Subjective   Subjective: Pt reports feeling okay today. "I'd say a little better"     Objective   Objective:      OP PT Treatment - Mon February 11, 2023       Row Name 0745       Interpreter Services    Is an interpreter used?  N       Pain Assessment    Pain Assessment 0-10       Objective Measurements    Palpation/Inspection Mod tender B Distal IT bands/L Distal vastus laterallis       Therapeutic Exercise    Exercise BIke x21min    Exercise B TKE's GTB 2x10    Exercise Mini Squats x10    Exercise Bridges with Hip Abd 3x10    Exercise B Quad stretch prone sith strap 3x30"    Exercise B gastroc stretch 3x30"    Exercise B hamstring stretch 3x30"       Manual Therapy    Manual Therapy Soft tissue mobilizations B Distal IT band       Modalities    Cryotherapy (Minutes\Location) x 10 mins with elevation x 10 mins B Knees                    Assessment   Assessment: Pt note s some improved sx today w/ less sensitivity along ITB w/ STM. Continues to have discomfort w/ mini squats requiring cues to avoid excessive forward translation of knees.     Plan   Plan: Plan to continue per current POC to restore functional strength and mobility.     Therapy Time  PT Therapeutic Procedures Time Entry  Manual Therapy Time Entry: 12  Therapeutic Exercise Time Entry: [address] PTA  [place] lic # [id number]

## 2023-02-13 ENCOUNTER — Encounter: Admit: 2023-02-13 | Discharge: 2023-02-13 | Payer: MEDICARE | Primary: Family Medicine

## 2023-02-13 DIAGNOSIS — M1712 Unilateral primary osteoarthritis, left knee: Secondary | ICD-10-CM

## 2023-02-13 NOTE — Progress Notes (Signed)
Physical Therapy Visit Treatment Note    Patient Name: Molly Jensen  MRN: [id number]  DOB: 09/26/1950  Today's Date: 02/13/2023  Initial Eval Date:  01/22/2023  Referral Diagnosis:  M17.11  M17.12  Primary osteoarthritis of right knee  Primary osteoarthritis of left knee    Subjective   Subjective: "My left side is more sore today. I stepped on it wrong and it did what it usually does"     Objective   Objective:      OP PT Treatment - Wed February 13, 2023       Row Name 0715       Interpreter Services    Is an interpreter used?  N       Pain Assessment    Pain Assessment 0-10       Objective Measurements    Palpation/Inspection Mod tender B Distal IT bands/L Distal vastus laterallis       Therapeutic Exercise    Exercise BIke x85min    Exercise B TKE's GTB 2x10    Exercise Mini Squats x10    Exercise Bridges with Hip Abd 3x10    Exercise B Quad stretch prone sith strap 3x30"    Exercise B gastroc stretch 3x30"    Exercise B hamstring stretch 3x30"       Manual Therapy    Manual Therapy Soft tissue mobilizations B Distal IT band       Modalities    Cryotherapy (Minutes\Location) x 10 mins with elevation x 10 mins B Knees                    Assessment   Assessment: Pt presents to PT w/ L>R knee soreness. Has started modifying her walks to decrease amount of incline/decline w/ improved tolerance. Provided updated HEP and reviewed w/ good understanding.     Plan   Plan: Plan to continue per current POC to restore functional strength and mobility.     Therapy Time  PT Therapeutic Procedures Time Entry  Manual Therapy Time Entry: 11  Therapeutic Exercise Time Entry: [address], PTA  [place] lic # [id number]

## 2023-02-19 ENCOUNTER — Encounter
Admit: 2023-02-19 | Discharge: 2023-02-19 | Payer: MEDICARE | Attending: Rehabilitative and Restorative Service Providers" | Primary: Family Medicine

## 2023-02-19 DIAGNOSIS — M1711 Unilateral primary osteoarthritis, right knee: Secondary | ICD-10-CM

## 2023-02-19 NOTE — Progress Notes (Signed)
Physical Therapy Progress Note - Re-Evaluation    Patient Name: Molly Jensen  MRN: [id number]  DOB: November 06, 1950  Initial Eval Date:  01/22/2023  Re-Evaluation Date: 02/19/2023  Referral Diagnosis: M17.11  M17.12 Primary osteoarthritis of right knee  Primary osteoarthritis of left knee    Subjective     General Visit Information:  Interpreter Services  Is an interpreter used? : N    Prior Level of Function:       Current Functional Status  Self Care: min limitation  Transitional Mobility: min limitation  Walking: slight limitation  Stairs: Descend reciprocally- Unable to perform, Ascend- no limitation  Sleeping: min limitation  Prolonged Standing: mod limitation    Subjective: Patient relates her knees are feeling better and is able to walk without her cane    Pain  Pain Assessment  Pain Assessment: 0-10  Pain Score:  (0-5/10)  Pain Type: Chronic pain  Pain Location: Knee  Pain Orientation: Left, Right        Objective     Posture/Observation       Functional Mobility       OP PT General Evaluation       Row Name 02/19/23 0700 01/22/23 0900       Gait Assessment    Gait Assessment --  slight decreased B Knee ext with min decreased B heel strike --  MIn decreased B knee exten/B heel strike                    Functional Assessment       Range of Motion          OP PT General Evaluation       Row Name 02/19/23 0700 01/22/23 0900       AROM Right Lower Extremity    R Knee Flexion 0-140 -- WNL    R Knee Extension 0-130 0 - 5    Comments Gastroc lengths: L: 11 Gastrroc lengths: L: 9, R: 14       AROM Left Lower Extremity    L Knee Flexion 0-140 -- WNL    L Knee Extension 0-130 -3 -5                    Strength        OP PT General Evaluation       Row Name 02/19/23 0700 01/22/23 0900       Strength Right Lower Extremity    R Hip Flexion -- 5/5    R Hip Extension -- 5/5    R Hip ABduction -- 5/5    R Hip ADduction -- 5/5    R Knee Flexion -- 5/5    R Knee Extension 5/5 --  4 to 4+/5    R Ankle Dorsiflexion -- 5/5    R Ankle  Plantar Flexion 4+/5 4-/5       Strength Left Lower Extremity    L Hip Flexion -- 5/5    L Hip Extension -- 5/5    L Hip ABduction -- 5/5    L Hip ADduction -- 5/5    L Knee Flexion -- 5/5    L Knee Extension 5/5 --  4 to 4+/5    L Ankle Dorsiflexion -- 5/5    L Ankle Plantar Flexion 4+/5 4/5                    Palpation  Palpation/Inspection  Girth : Mid-Patella: Bp- 15 in,  1 in. distal: B- 14 3/4 in    Balance  Comments: SLS: R- 13 seconds    Neuro-Vascular                           Examination                   Outcome Measures     OP PT General Evaluation       Row Name 02/19/23 0700 01/22/23 0900       KOOS Jr.    STIFFNESS: How severe is your knee stiffness after first wakening in the morning? 0 1    PAIN: Twisting/pivoting on your knee 1 2    PAIN: Straightening knee fully 0 1    PAIN: Going up or down stairs 3 3    PAIN: Standing upright 2 2    FUNCTION, DAILY LIVING: Rising from sitting 1 2    FUNCTION, DAILY LIVING: Bending to floor/pick up an object 2 2    KOOS Jr. Raw Score 9 13    KOOS Jr. Interval (0 = total knee disability, and 100 = perfect knee health) 63.776 54.84                    Treatment   OP PT Treatment - Tue February 19, 2023       Row Name 0745       Therapeutic Exercise    Exercise B TKE's- x 30 with 4 plates    Exercise B Quad stretch prone with strap- 3x 30 sec      Row Name 0700       Interpreter Services    Is an interpreter used?  N       Pain Assessment    Pain Assessment 0-10    Pain Score --  0-5/10    Pain Type Chronic pain    Pain Location Knee    Pain Orientation Left;Right       Objective Measurements    Comments SLS: R- 13 seconds                    Assessment   Assessment:  Patient has made mod progress as patient presents with min/mod decreased pain levels, mod/max increased AROM B Knee ext, max increased strength B Knee ext, mod increased strength B ANkle PF, min increased flexibility, less faulty gait pattern, increased R LE proprioception now = L with report of less  pain/difficulty now with transfers/sleeping/ADL's/prolonged ambulation. Patient should benefit from con't skilled PT for further resolution of above objective, functional limitations          Goals:  Active        Patient will be abel to stand for prolonged periods with min pain/diffiulty  (Ongoing, Not Progressing)       Start:  01/22/23    Expected End:  02/26/23             patient will be able to ascend-descend stairs reciprocally with mod pain/difficulty  (Ongoing, Not Progressing)       Start:  01/22/23    Expected End:  02/26/23             Patient will be able to ambulate for prolonged periods without pain/difficulty        Start:  02/19/23    Expected End:  03/19/23             Patient will  be abel to perform all transfers without pain/difficulty        Start:  02/19/23    Expected End:  03/19/23             Decrease pain levels to a 0-2/10 (Ongoing, Progressing)       Start:  01/22/23    Expected End:  02/12/23             INcrease AROM B Knee ext 3 , L gastroc length 4 (Ongoing, Progressing)       Start:  01/22/23    Expected End:  02/12/23            Decrease edema B Knees by 1/4 in. throughout  (Ongoing, Not Progressing)       Start:  01/22/23    Expected End:  02/12/23             Increase strength B Ankle PF to 5/5       Start:  02/19/23    Expected End:  03/05/23           Resolved        Patient will be able to ambulate with min pain/difficulty (Met)       Start:  01/22/23    Expected End:  02/26/23    Resolved:  02/19/23          patient will be able to perform all transfers with min pain/difficulty  (Met)       Start:  01/22/23    Expected End:  02/26/23    Resolved:  02/19/23          INcrease strength B Knee extto 5/5, B Ankle PF 1/2 grade  (Met)       Start:  01/22/23    Expected End:  02/12/23    Resolved:  02/19/23               Plan   Plan     Plan  PT Frequency: 2 x/week  Duration: 4 weeks  Treatment Plan: Therapeutic Exercise (97110), Therapeutic Activity (97530), Manual Techniques  (97140), Neuromuscular Reeducation ([phone]), Balance Training, Estate manager/land agent, Patient/Family Education, Home Exercise Program, Hot/Cold Pack, Statistician, Utrasound (60454)    Therapy Time  PT Evaluation Time Entry  PT Re-Evaluation Time Entry: 20  PT Therapeutic Procedures Time Entry  Therapeutic Exercise Time Entry: 12    Tyrone Nine, PT  [place] lic # [id number]

## 2023-02-19 NOTE — Plan of Care (Signed)
Outpatient Physical Therapy Plan of Care    Patient Name: Molly Jensen  MRN: [id number]  DOB: 11-Feb-1951  Initial Eval Date:  01/22/2023  M17.11  M17.12 Primary osteoarthritis of right knee  Primary osteoarthritis of left knee    Assessment:  Patient has made mod progress as patient presents with min/mod decreased pain levels, mod/max increased AROM B Knee ext, max increased strength B Knee ext, mod increased strength B ANkle PF, min increased flexibility, less faulty gait pattern, increased R LE proprioception now = L with report of less pain/difficulty now with transfers/sleeping/ADL's/prolonged ambulation. Patient should benefit from con't skilled PT for further resolution of above objective, functional limitations          Goals:  Active        Patient will be abel to stand for prolonged periods with min pain/diffiulty  (Ongoing, Not Progressing)       Start:  01/22/23    Expected End:  02/26/23             patient will be able to ascend-descend stairs reciprocally with mod pain/difficulty  (Ongoing, Not Progressing)       Start:  01/22/23    Expected End:  02/26/23             Patient will be able to ambulate for prolonged periods without pain/difficulty        Start:  02/19/23    Expected End:  03/19/23             Patient will be abel to perform all transfers without pain/difficulty        Start:  02/19/23    Expected End:  03/19/23             Decrease pain levels to a 0-2/10 (Ongoing, Progressing)       Start:  01/22/23    Expected End:  02/12/23             INcrease AROM B Knee ext 3 , L gastroc length 4 (Ongoing, Progressing)       Start:  01/22/23    Expected End:  02/12/23            Decrease edema B Knees by 1/4 in. throughout  (Ongoing, Not Progressing)       Start:  01/22/23    Expected End:  02/12/23             Increase strength B Ankle PF to 5/5       Start:  02/19/23    Expected End:  03/05/23           Resolved        Patient will be able to ambulate with min pain/difficulty (Met)       Start:   01/22/23    Expected End:  02/26/23    Resolved:  02/19/23          patient will be able to perform all transfers with min pain/difficulty  (Met)       Start:  01/22/23    Expected End:  02/26/23    Resolved:  02/19/23          INcrease strength B Knee extto 5/5, B Ankle PF 1/2 grade  (Met)       Start:  01/22/23    Expected End:  02/12/23    Resolved:  02/19/23              Plan  Plan  PT Frequency: 2 x/week  Duration:  4 weeks  Treatment Plan: Therapeutic Exercise [phone]), Therapeutic Activity [phone]), Manual Techniques [phone]), Neuromuscular Reeducation [phone]), Balance Training, Estate manager/land agent, Patient/Family Education, Home Exercise Program, Hot/Cold Pack, Statistician, Utrasound ([phone])    Tyrone Nine, PT  [place] lic # [id number]

## 2023-08-12 ENCOUNTER — Inpatient Hospital Stay
Admission: EM | Admit: 2023-08-12 | Discharge: 2023-08-13 | Disposition: A | Discharge status: Home / Self Care | Payer: MEDICARE | Admitting: Internal Medicine

## 2023-08-12 ENCOUNTER — Emergency Department: Admit: 2023-08-12 | Payer: MEDICARE

## 2023-08-12 DIAGNOSIS — J101 Influenza due to other identified influenza virus with other respiratory manifestations: Secondary | ICD-10-CM

## 2023-08-12 LAB — CBC WITH DIFFERENTIAL
Basophils %: 0.6 %
Basophils Absolute: 0.04 10*3/uL (ref 0.00–0.22)
Eosinophils %: 0.8 %
Eosinophils Absolute: 0.06 10*3/uL (ref 0.00–0.50)
Hematocrit: 40.8 % (ref 32.0–47.0)
Hemoglobin: 13.2 g/dL (ref 11.0–16.0)
Immature Granulocytes %: 0.7 %
Immature Granulocytes Absolute: 0.05 10*3/uL (ref 0.00–0.10)
Lymphocyte %: 13.1 %
Lymphocytes Absolute: 0.95 10*3/uL (ref 0.70–4.00)
MCH: 29 pg (ref 26.0–34.0)
MCHC: 32.4 g/dL (ref 31.0–37.0)
MCV: 89.7 fL (ref 80.0–100.0)
MPV: 8.6 fL — ABNORMAL LOW (ref 9.1–12.4)
Monocytes %: 11.7 %
Monocytes Absolute: 0.85 10*3/uL — ABNORMAL HIGH (ref 0.36–0.77)
NRBC %: 0 % (ref 0.0–0.0)
NRBC Absolute: 0 10*3/uL (ref 0.00–2.00)
Neutrophil %: 73.1 %
Neutrophils Absolute: 5.32 10*3/uL (ref 1.50–7.95)
Platelets: 390 10*3/uL (ref 150–400)
RBC: 4.55 M/uL (ref 3.70–5.20)
RDW-CV: 14.5 % (ref 11.5–14.5)
RDW-SD: 46.5 fL (ref 35.0–51.0)
WBC: 7.3 10*3/uL (ref 4.0–11.0)

## 2023-08-12 LAB — TROPONIN I, HIGH SENSITIVITY: Troponin I, High Sensitivity: 17 ng/L

## 2023-08-12 LAB — BLOOD GAS, VENOUS
Base Excess. Calculated: 2 mmol/L
Bicarbonate (Total CO2): 25 mmol/L (ref 20–32)
O2 Sat (Estimated): 70 % (ref 50–70)
pCO2: 47 mmHg (ref 41–51)
pH: 7.38 (ref 7.32–7.42)
pO2: 36 mmHg

## 2023-08-12 LAB — COMPREHENSIVE METABOLIC PANEL
ALT: 16 U/L (ref 0–55)
AST: 21 U/L (ref 6–42)
Albumin: 3.3 g/dL (ref 3.2–5.0)
Alkaline phosphatase: 131 U/L — ABNORMAL HIGH (ref 30–130)
Anion Gap: 5 mmol/L (ref 3–14)
BUN: 10 mg/dL (ref 6–24)
Bilirubin, total: 0.4 mg/dL (ref 0.2–1.2)
CO2 (Bicarbonate): 26 mmol/L (ref 20–32)
Calcium: 8.8 mg/dL (ref 8.5–10.5)
Chloride: 107 mmol/L (ref 98–110)
Creatinine: 1.13 mg/dL (ref 0.55–1.30)
Glucose: 92 mg/dL (ref 70–110)
Potassium: 4.1 mmol/L (ref 3.6–5.2)
Protein, total: 7.6 g/dL (ref 6.0–8.4)
Sodium: 138 mmol/L (ref 135–146)
eGFRcr: 52 mL/min/{1.73_m2} — ABNORMAL LOW (ref >=60)

## 2023-08-12 LAB — PROTIME-INR
INR: 1
Protime: 11.1 s (ref 9.3–11.6)

## 2023-08-12 LAB — C-REACTIVE PROTEIN: CRP: 1.01 mg/dL — ABNORMAL HIGH (ref 0.29–0.80)

## 2023-08-12 LAB — SARS-COV-2 AND INFLUENZA A/B RNA BY PCR
Influenza A RNA: NOT DETECTED
Influenza B RNA: DETECTED — AB
SARS-CoV-2 RNA PCR: NOT DETECTED

## 2023-08-12 LAB — RAINBOW DRAW SST GOLD TOP

## 2023-08-12 LAB — LIGHT BLUE TOP

## 2023-08-12 LAB — D-DIMER, QUANTITATIVE: D-DIMER: 2010 ng{FEU}/mL — ABNORMAL HIGH (ref 0.00–500.00)

## 2023-08-12 LAB — POCT GLUCOSE: POCT Glucose: 78 mg/dL (ref 70–110)

## 2023-08-12 LAB — RAINBOW DRAW LT. GREEN PST TOP

## 2023-08-12 LAB — PTT: aPTT: 28 s (ref 23–32)

## 2023-08-12 LAB — PROCALCITONIN: Procalcitonin: <0.05 ng/mL

## 2023-08-12 LAB — NT PRO BNP: NT-proBNP: 371 pg/mL — ABNORMAL HIGH (ref 0–125)

## 2023-08-12 LAB — LACTIC ACID: Lactic acid: 1.7 mmol/L (ref 0.4–2.0)

## 2023-08-12 LAB — MAGNESIUM: Magnesium: 1.8 mg/dL (ref 1.6–2.6)

## 2023-08-12 LAB — LAVENDER TOP

## 2023-08-12 MED ORDER — oseltamivir (Tamiflu) capsule 30 mg
30 | Freq: Two times a day (BID) | ORAL | Status: DC
Start: 2023-08-12 — End: 2023-08-13
  Administered 2023-08-13 (×2): 30 mg via ORAL

## 2023-08-12 MED ORDER — sodium chloride 0.9 % bolus 500 mL
0.9 | Freq: Once | INTRAVENOUS | Status: AC
Start: 2023-08-12 — End: 2023-08-12
  Administered 2023-08-12: 18:00:00 500 mL via INTRAVENOUS

## 2023-08-12 MED ORDER — acetaminophen (Tylenol) tablet 975 mg
325 | Freq: Once | ORAL | Status: AC
Start: 2023-08-12 — End: 2023-08-12
  Administered 2023-08-12: 15:00:00 975 mg via ORAL

## 2023-08-12 MED ORDER — lisinopril tablet 20 mg
20 | Freq: Every day | ORAL | Status: DC
Start: 2023-08-12 — End: 2023-08-13
  Administered 2023-08-12 – 2023-08-13 (×2): 20 mg via ORAL

## 2023-08-12 MED ORDER — piperacillin-tazobactam (Zosyn) injection 4.5 g
4.5 | Freq: Once | INTRAVENOUS | Status: AC
Start: 2023-08-12 — End: 2023-08-12
  Administered 2023-08-12: 18:00:00 4.5 g via INTRAVENOUS

## 2023-08-12 MED ORDER — acetaminophen (Tylenol) solution 650 mg
325 | Freq: Four times a day (QID) | ORAL | Status: DC | PRN
Start: 2023-08-12 — End: 2023-08-12
  Administered 2023-08-13: 02:00:00 650 mg via ORAL

## 2023-08-12 MED ORDER — dextrose 50 % in water (D50W) solution 25-50 mL
INTRAVENOUS | Status: DC | PRN
Start: 2023-08-12 — End: 2023-08-13

## 2023-08-12 MED ORDER — benzonatate (Tessalon) capsule 100 mg
100 | Freq: Three times a day (TID) | ORAL | Status: DC | PRN
Start: 2023-08-12 — End: 2023-08-13
  Administered 2023-08-13 (×3): 100 mg via ORAL

## 2023-08-12 MED ORDER — acetaminophen (Tylenol) suppository 650 mg
650 | Freq: Four times a day (QID) | RECTAL | Status: DC | PRN
Start: 2023-08-12 — End: 2023-08-12

## 2023-08-12 MED ORDER — dextrose (D10W) 10 % bolus 125-250 mL
10 | INTRAVENOUS | Status: DC | PRN
Start: 2023-08-12 — End: 2023-08-13

## 2023-08-12 MED ORDER — acetaminophen (Tylenol) suppository 650 mg
650 | Freq: Four times a day (QID) | RECTAL | Status: DC | PRN
Start: 2023-08-12 — End: 2023-08-13

## 2023-08-12 MED ORDER — acetaminophen (Tylenol) tablet 650 mg
325 | Freq: Four times a day (QID) | ORAL | Status: DC | PRN
Start: 2023-08-12 — End: 2023-08-13
  Administered 2023-08-13 (×2): 650 mg via ORAL

## 2023-08-12 MED ORDER — enoxaparin (Lovenox) syringe 40 mg
40 | Freq: Every day | SUBCUTANEOUS | Status: DC
Start: 2023-08-12 — End: 2023-08-13
  Administered 2023-08-12 – 2023-08-13 (×2): 40 mg via SUBCUTANEOUS

## 2023-08-12 MED ORDER — oseltamivir (Tamiflu) capsule 75 mg
75 | Freq: Two times a day (BID) | ORAL | Status: DC
Start: 2023-08-12 — End: 2023-08-12

## 2023-08-12 MED ORDER — oseltamivir (Tamiflu) capsule 75 mg
75 | Freq: Once | ORAL | Status: AC
Start: 2023-08-12 — End: 2023-08-12
  Administered 2023-08-12: 19:00:00 75 mg via ORAL

## 2023-08-12 MED ORDER — levoFLOXacin (Levaquin) IVPB 750 mg
750 | INTRAVENOUS | Status: DC
Start: 2023-08-12 — End: 2023-08-13
  Administered 2023-08-12: 23:00:00 750 mg via INTRAVENOUS

## 2023-08-12 MED ORDER — acetaminophen (Tylenol) tablet 650 mg
325 | Freq: Four times a day (QID) | ORAL | Status: DC | PRN
Start: 2023-08-12 — End: 2023-08-12

## 2023-08-12 MED ORDER — ALPRAZolam (Xanax) tablet 0.5 mg
0.25 | Freq: Two times a day (BID) | ORAL | Status: DC | PRN
Start: 2023-08-12 — End: 2023-08-13

## 2023-08-12 MED ORDER — acetaminophen (Tylenol) solution 650 mg
325 | Freq: Four times a day (QID) | ORAL | Status: DC | PRN
Start: 2023-08-12 — End: 2023-08-13

## 2023-08-12 MED ORDER — sodium chloride 0.9 % infusion
0.9 | INTRAVENOUS | Status: DC
Start: 2023-08-12 — End: 2023-08-13
  Administered 2023-08-12 – 2023-08-13 (×2): 100 mL/h via INTRAVENOUS

## 2023-08-12 MED ORDER — iohexol (OMNIPaque) 350 mg iodine/mL solution 90 mL
350 | Freq: Once | INTRAVENOUS | Status: AC
Start: 2023-08-12 — End: 2023-08-12
  Administered 2023-08-12: 19:00:00 90 mL via INTRAVENOUS

## 2023-08-12 MED ORDER — glucagon injection 1 mg
1 | INTRAMUSCULAR | Status: DC | PRN
Start: 2023-08-12 — End: 2023-08-13

## 2023-08-12 MED FILL — PIPERACILLIN-TAZOBACTAM 4.5 GRAM INTRAVENOUS SOLUTION: 4.5 4.5 gram | INTRAVENOUS | Qty: 4.5

## 2023-08-12 MED FILL — ENOXAPARIN 40 MG/0.4 ML SUBCUTANEOUS SYRINGE: 40 40 mg/0.4 mL | SUBCUTANEOUS | Qty: 0.4

## 2023-08-12 MED FILL — ACETAMINOPHEN 325 MG TABLET: 325 325 mg | ORAL | Qty: 3

## 2023-08-12 MED FILL — LEVOFLOXACIN 750 MG/150 ML IN 5 % DEXTROSE INTRAVENOUS PIGGYBACK: 750 750 mg/150 mL | INTRAVENOUS | Qty: 150

## 2023-08-12 MED FILL — LISINOPRIL 20 MG TABLET: 20 20 mg | ORAL | Qty: 1

## 2023-08-12 MED FILL — OSELTAMIVIR 75 MG CAPSULE: 75 75 mg | ORAL | Qty: 1

## 2023-08-12 NOTE — Care Plan (Signed)
Problem: Adult Inpatient Plan of Care  Goal: Patient-Specific Goal (Individualized)  08/12/2023 1845 by Dionne Milo, RN  Outcome: Ongoing, Progressing  Flowsheets (Taken 08/12/2023 1844)  Patient-Specific Goals (Include Timeframe): Abx, comfort, safety  Individualized Care Needs: Abx, comfort, safety  Anxieties, Fears or Concerns: None Stated  08/12/2023 1843 by Dionne Milo, RN  Outcome: Ongoing, Progressing     Problem: Adult Inpatient Plan of Care  Goal: Absence of Hospital-Acquired Illness or Injury  08/12/2023 1845 by Dionne Milo, RN  Outcome: Ongoing, Progressing  08/12/2023 1843 by Dionne Milo, RN  Outcome: Ongoing, Progressing  Intervention: Identify and Manage Fall Risk  Flowsheets (Taken 08/12/2023 1832)  Safety Promotion/Fall Prevention:   activity supervised   nonskid shoes/slippers when out of bed   room organization consistent   safety round/check completed   clutter-free environment maintained  Intervention: Prevent Skin Injury  Flowsheets (Taken 08/12/2023 1832)  Body Position: position changed independently  Intervention: Prevent and Manage VTE (Venous Thromboembolism) Risk  Flowsheets (Taken 08/12/2023 1832)  Range of Motion: active ROM (range of motion) encouraged  Activity Expectations: activity adjusted per tolerance  VTE Prevention/Management: (SC Lovenox) other (see comments)  Intervention: Prevent Infection  Flowsheets (Taken 08/12/2023 1832)  Infection Prevention: (Droplet precautions) single patient room provided     Problem: Adult Inpatient Plan of Care  Goal: Optimal Comfort and Wellbeing  08/12/2023 1845 by Dionne Milo, RN  Outcome: Ongoing, Progressing  08/12/2023 1843 by Dionne Milo, RN  Outcome: Ongoing, Progressing  Intervention: Monitor Pain and Promote Comfort  Flowsheets (Taken 08/12/2023 1832)  Pain Management Interventions:   care clustered   position adjusted   pillow support provided  Intervention: Provide Person-Centered Care  Flowsheets (Taken 08/12/2023  1832)  Trust Relationship/Rapport:   care explained   choices provided   emotional support provided   empathic listening provided   thoughts/feelings acknowledged   reassurance provided     Problem: Adult Inpatient Plan of Care  Goal: Readiness for Transition of Care  08/12/2023 1845 by Dionne Milo, RN  Outcome: Ongoing, Progressing  08/12/2023 1843 by Dionne Milo, RN  Outcome: Ongoing, Progressing     Problem: Fall Injury Risk  Goal: Absence of Fall and Fall-Related Injury  08/12/2023 1845 by Dionne Milo, RN  Outcome: Ongoing, Progressing  08/12/2023 1843 by Dionne Milo, RN  Outcome: Ongoing, Progressing  Intervention: Identify and Manage Contributors  Flowsheets (Taken 08/12/2023 1832)  Medication Review/Management: medications reviewed  Self-Care Promotion: independence encouraged  Intervention: Promote Injury-Free Environment  Flowsheets (Taken 08/12/2023 1832)  Safety Promotion/Fall Prevention:   activity supervised   nonskid shoes/slippers when out of bed   room organization consistent   safety round/check completed   clutter-free environment maintained     Problem: Pain Acute  Goal: Acceptable Pain Control and Functional Ability  08/12/2023 1845 by Dionne Milo, RN  Outcome: Ongoing, Progressing  08/12/2023 1843 by Dionne Milo, RN  Outcome: Ongoing, Progressing  Intervention: Develop Pain Management Plan  Flowsheets (Taken 08/12/2023 1832)  Pain Management Interventions:   care clustered   position adjusted   pillow support provided  Intervention: Prevent or Manage Pain  Flowsheets (Taken 08/12/2023 1832)  Medication Review/Management: medications reviewed  Intervention: Optimize Psychosocial Wellbeing  Flowsheets (Taken 08/12/2023 1832)  Diversional Activities:   television   tablet     Problem: Infection  Goal: Absence of Infection Signs and Symptoms  08/12/2023 1845 by Dionne Milo, RN  Outcome: Ongoing, Progressing  08/12/2023 1843 by Dionne Milo, RN  Outcome: Ongoing,  Progressing  Intervention: Prevent or Manage Infection  Flowsheets (Taken 08/12/2023 1845)  Infection Management: aseptic technique maintained  Isolation Precautions:   precautions initiated   droplet   Goal Outcome Evaluation:  Plan of Care Reviewed With: patient  Progress: no change  Outcome Evaluation: Pt. admitted for Influenza B after two weeks of PNA not relieved by PO antibiotics.  Pulmonary MD consulted, note pending.  On IV Levaquin for PNA or other possible infections still being ruled out.  Continues on NS @100ml /hr until further notice.  Bed in lowest position, alarm is on and activated, call light in reach.  COntinue Plan of Care.

## 2023-08-12 NOTE — ED Provider Notes (Signed)
[facility]  [address]  [place] [address]    [facility]  [address]  [place] [address]     HPI   Chief Complaint   Patient presents with    Shortness of Breath        Patient with history of hypertension, diabetes, anxiety, neuropathic foot pain, osteopenia back pain, right knee meniscus tear, liver steatosis, cholecystectomy, back surgery, hysterectomy, non-smoker, presenting today with report of pneumonia for 4 weeks not responsive to outpatient antibiotic treatment.  Has also been on prednisone and cough medicines.  Patient has been seen and followed with multiple visits over the past 4 weeks according to the patient and has been given azithromycin followed by doxycycline which she completed 4 days ago.  Dealing PCP note from today also looks like she has been on Augmentin in addition to doxycycline.  She continues with fevers and chills, productive cough and congestion.  She reports sputum is orange.  She reports shortness of breath with wheezing.  She has had generalized chest pain with pneumonia but notes no chest pain today.  She reports she was also ill back in September, 3 months ago when she went to [place].  She did not go outside the city.  She was given azithromycin at that time while in [place], and then in October, had COVID 19.  She has had chronic bilateral leg pain.  No swelling or redness.  No rash.  She has had urgency and frequency and cough incontinence with her urine.  She feels like she has had a UTI although some of the symptoms have improved with eating more yogurt.  She has also had loose stool associated with this illness.  No abdominal pain.  No vomiting.  No nausea.  No acute headache or neck pain.  Lifetime non-smoker.  Lives alone, no sick contacts.  No pets.  Has not had flu shot this year nor any recent COVID vaccination.  Several years ago did get her pneumonia shot.  Daughter at bedside.        Patient  History   Past Medical History:   Diagnosis Date    Hypertension (CMS-HCC)      Past Surgical History:   Procedure Laterality Date    CHOLECYSTECTOMY      CTA CHEST W AND WO CONTRAST  08/12/2023    CTA CHEST W AND WO CONTRAST 08/12/2023 [facility] CT    HYSTERECTOMY       No family history on file.  Social History     Tobacco Use    Smoking status: Never    Smokeless tobacco: Never   Vaping Use    Vaping status: Never Used   Substance Use Topics    Alcohol use: Never    Drug use: Never       Review of Systems   Review of Systems    Physical Exam   ED Triage Vitals [08/12/23 0951]   Temp Pulse Resp BP   (!) 39.3 C (102.7 F) 111 22 (!) 158/92      SpO2 Temp Source Heart Rate Source Patient Position   96 % Oral Monitor Sitting      BP Location FiO2 (%)     Left arm --       Physical Exam  Vitals and nursing note reviewed.   Constitutional:       General: She is not in acute distress.     Appearance: Normal appearance.  She is well-developed. She is obese. She is ill-appearing.   HENT:      Head: Normocephalic and atraumatic.      Nose: Nose normal. No congestion.      Mouth/Throat:      Mouth: Mucous membranes are moist.      Pharynx: Oropharynx is clear. No pharyngeal swelling, oropharyngeal exudate or posterior oropharyngeal erythema.   Eyes:      Extraocular Movements: Extraocular movements intact.      Conjunctiva/sclera: Conjunctivae normal.      Pupils: Pupils are equal, round, and reactive to light.   Cardiovascular:      Rate and Rhythm: Regular rhythm. Tachycardia present.      Pulses: Normal pulses.      Heart sounds: Normal heart sounds. No murmur heard.  Pulmonary:      Effort: Pulmonary effort is normal. No respiratory distress.      Breath sounds: Normal breath sounds. No decreased breath sounds, wheezing, rhonchi or rales.      Comments: Upper respiratory congestion  Chest:      Chest wall: No mass.   Abdominal:      Palpations: Abdomen is soft.      Tenderness: There is no abdominal tenderness.    Musculoskeletal:         General: No swelling. Normal range of motion.      Cervical back: Normal range of motion and neck supple. No tenderness.      Right lower leg: No edema.      Left lower leg: No edema.      Comments: Notes generalized pain, no pain with flexion, no palpable cords, no swelling   Lymphadenopathy:      Cervical: No cervical adenopathy.   Skin:     General: Skin is warm and dry.      Capillary Refill: Capillary refill takes less than 2 seconds.      Coloration: Skin is not cyanotic.      Comments: Trace erythema on back where contact has been with the sheets and patient febrile   Neurological:      General: No focal deficit present.      Mental Status: She is alert and oriented to person, place, and time.      Comments: Generalized weakness with no focal neurologic deficit equal strength x 4   Psychiatric:         Mood and Affect: Mood normal.       CTA CHEST W AND WO CONTRAST   Final Result   1.  Mild nonspecific diffuse interstitial prominence, which could represent edema or atypical pneumonia in the appropriate clinical setting.  No evidence of pulmonary embolus.   2.  Other incidental findings as above.            Burnett Kanaris, MD 08/12/2023 2:11 PM      XR CHEST 2 VIEWS   Final Result   No active disease.      Nolon Bussing [facility] 08/12/2023 10:18 AM          Labs Reviewed   COMPREHENSIVE METABOLIC PANEL - Abnormal       Result Value    Sodium 138      Potassium 4.1      Chloride 107      CO2 (Bicarbonate) 26      Anion Gap 5      BUN 10      Creatinine 1.13      eGFRcr 52 (*)  Glucose 92      Fasting? Unknown      Calcium 8.8      AST 21      ALT 16      Alkaline phosphatase 131 (*)     Protein, total 7.6      Albumin 3.3      Bilirubin, total 0.4     CBC WITH DIFFERENTIAL - Abnormal    WBC 7.3      RBC 4.55      Hemoglobin 13.2      Hematocrit 40.8      MCV 89.7      MCH 29.0      MCHC 32.4      RDW-CV 14.5      RDW-SD 46.5      Platelets 390      MPV 8.6 (*)     Neutrophil % 73.1       Lymphocyte % 13.1      Monocytes % 11.7      Eosinophils % 0.8      Basophils % 0.6      Immature Granulocytes % 0.7      NRBC % 0.0      Neutrophils Absolute 5.32      Lymphocytes Absolute 0.95      Monocytes Absolute 0.85 (*)     Eosinophils Absolute 0.06      Basophils Absolute 0.04      Immature Granulocytes Absolute 0.05      NRBC Absolute 0.00     C-REACTIVE PROTEIN - Abnormal    CRP 1.01 (*)    NT PRO BNP - Abnormal    NT-proBNP 371 (*)     Narrative:     The 2021 Universal Definition and Classification of Heart Rafael Bihari states that a diagnosis of heart failure (HF) requires symptoms and/or signs of HF caused by structural and/or functional cardiac abnormalities, accompanied by objective evidence of congestion and/or:    Test               Ambulatory Patients    Hospitalized/Decompensated  BNP (pg/mL)         >=35                   >=100          NT-proBNP (pg/mL)   >=125                  >=300       Persistently elevated BNP or NT-proBNP despite HF therapies is associated with more clinical events. Patients with heart failure with preserved ejection fraction (HFpEF) have lower levels of the natriuretic peptides than patients with a similar severity of heart failure with reduced ejection fraction (HFrEF). NT-proBNP (rather than BNP) is the biomarker of choice for patients prescribed a neprilysin inhibitor.    In dyspneic patients in emergency department settings,2 NT-proBNP cut-offs for diagnosis of acute HF were 450, 900, and 1,800 pg/ml for ages <50, 50 to 36, and >75 years.    (1) Bozkurt B et al.  J Cardiac Fail 2021;27:387-413  (2) Madalyn Rob et al. J Am Coll Cardiol 2018;71:1191-1200.       D-DIMER, QUANTITATIVE - Abnormal    D-DIMER 2,010.00 (*)    SARS-COV-2 AND INFLUENZA A/B RNA BY PCR - Abnormal    Influenza A RNA Not Detected      Influenza B RNA Detected (*)     SARS-CoV-2 RNA PCR Not Detected  LACTIC ACID - Normal    Lactic acid 1.7     MAGNESIUM - Normal    Magnesium 1.8     PROTIME-INR -  Normal    Protime 11.1      INR 1.00     PTT - Normal    aPTT 28     TROPONIN I, HIGH SENSITIVITY - Normal    Troponin I, High Sensitivity 17      Narrative:     Clinical correlation, HEART Score and shared decision making must be taken into account.                   For Chest Pain >3 Hours    Rule-Out Criteria              Single Value     0hr/1hr Delta Value  Female  <10ng/L*          <54ng/L AND delta <15ng/L  Female    <10ng/L*          <79ng/L AND delta <15ng/L    Cannot Rule-Out                            0hr/1hr Delta Value  Female    <54ng/L AND delta >15ng/L    >/= 54 AND delta </=15ng/L  Female      <79ng/L AND delta >15ng/L    >/= 79 AND delta </=15ng/L    Rule-In Criteria               Single Value   0hr/1hr Delta Value                 Female   >115ng/L       >/=54ng/L AND delta >/=15ng/L         Female     >115ng/L       >/=79ng/L AND delta >/=15ng/L           *Note: for Chest pain <3 hours 0hr/1hr is warranted for evaluation.     COVID-19 AND OTHER RESPIRATORY VIRAL PCR TESTING    Narrative:     The following orders were created for panel order COVID-19 and Other Respiratory Viral PCR Tests.  Procedure                               Abnormality         Status                     ---------                               -----------         ------                     SARS-CoV-2 and Influenza.Marland Kitchen.[[id number]]  Abnormal            Final result                 Please view results for these tests on the individual orders.   BLOOD CULTURE   BLOOD CULTURE   CBC W/DIFF    Narrative:     The following orders were created for panel order CBC and differential.  Procedure  Abnormality         Status                     ---------                               -----------         ------                     CBC w/ Differential[[id number]]          Abnormal            Final result                 Please view results for these tests on the individual orders.   BLOOD GAS, VENOUS    Source Blood, Venous       pH 7.38      pCO2 47      pO2 36      Bicarbonate (Total CO2) 25      O2 Sat (Estimated) 70      Base Excess. Calculated 2     PROCALCITONIN   URINE TESTING (UA, UARC, URINE CULTURE)    Narrative:     The following orders were created for panel order Urine Testing (UA, UARC, Urine Culture).  Procedure                               Abnormality         Status                     ---------                               -----------         ------                     Urinalysis reflex to cul.Marland Kitchen.[[id number]]                                                   Please view results for these tests on the individual orders.   URINALYSIS REFLEX TO CULTURE       Glasgow Coma Scale Score: 15      ED Course & MDM   ED Course as of 08/12/23 1510   Mon Aug 12, 2023   1148 XR CHEST 2 VIEWS  Clear lungs, no focal infiltrate. Independent interpretation.     1148 Hemoglobin: 13.2  No anemia   1149 Auto WBC: 7.3  Nl WBC   1149 Comprehensive metabolic panel(!)  Nl Lytes. Mild decrease in GFR. Cr 1.13   1149 In to see patient, febrile 102.7, tachycardic 111, not hypotensive, O2 sat 96%   1248 ECG 12 lead  SR 97, premature complexes, no acute STE. Independent interpretation.     1252 NT-proBNP(!): 371  Mildly elevated, no hx CHF   1252 CRP(!): 1.01  Inflammatory marker elevated   1252 D-DIMER(!): 2,010.00  Elevated D-dimer. For CTA chest imaging   1342 Influenza B(!): Detected  +flu, for treatment oseltamivir 75 mg (then 30 mg bid)   1438  Call out to hospitalist and patient and daughter updated   1502 Accepted by Dr Felipa Furnace   1504 Procalcitonin Test  pending   1504 Urine Testing (UA, UARC, Urine Culture)  pending         Diagnoses as of 08/12/23 1510   Influenza B   Generalized weakness   Bronchitis   Diarrhea, unspecified type   Shortness of breath       Medical Decision Making  Patient currently febrile and tachycardic with normal oxygenation on room air but appears generalized weakness and concerning history of ongoing fever chills  cough congestion generalized weakness for the past 4 weeks.  Check viral screen.  Add blood cultures.  Do additional blood work in regards to evaluation for pneumonia and sepsis including lactate, procalcitonin, CRP.  Evaluate for cardiac or pulmonary embolism contributors or etiology with BNP, Trope, D-dimer, EKG.  Current chest x-ray is clear of any infiltrative process.  Depending on ongoing evaluation consider CT imaging to further evaluate.  Check urine for UTI.  Start broad-spectrum antibiotics, Vanco and Zosyn.  Tylenol for fever.  Normal saline IV fluid hydration.    Findings as in ED course.  With clinical symptoms of bronchitis, urinary symptoms and diarrhea, findings for influenza B with possible viral pneumonia but no findings for bacterial pneumonia.  Blood cultures pending, urine pending.  Loading dose of Tamiflu given at 75 mg and further dosing anticipated 30 mg twice daily.  With patient with ongoing respiratory symptoms, generalized weakness, does not feel well for home, will admit to hospital for further evaluation and management.    Problems Addressed:  Influenza B: complicated acute illness or injury    Amount and/or Complexity of Data Reviewed  Independent Historian:      Details: Daughter provided additional details regarding travel.  External Data Reviewed: notes.     Details: PCP notes  Labs: ordered. Decision-making details documented in ED Course.  Radiology: ordered. Decision-making details documented in ED Course.  ECG/medicine tests: ordered. Decision-making details documented in ED Course.    Risk  OTC drugs.  Prescription drug management.                            Coy Saunas, MD  08/12/23 [phone]

## 2023-08-12 NOTE — ED Triage Notes (Signed)
Pt presents with PNA x 2 weeks, On and off ABX with no relief, Sent her by PCP for IV ABX and fluids. Generalized fatigue and weakness. WOB WNL.

## 2023-08-12 NOTE — Consults (Signed)
Pulmonary Consult Note    Briefly, she is a 72 year old woman with no prior notable pulmonary history who presents to the ED with complaints of persistent cough for over a month associated with intermittent fevers, diffuse myalgias, fatigue and malaise.  She has had several infectious issues over the last several months.  She was diagnosed with strep throat in September when visiting her daughter in [place].  She was given antibiotics and recovered from that.  In October she was diagnosed with COVID and again seemed to recover.  Over the last month she has had persistent symptoms as above.  She has been treated as an outpatient with a course of azithromycin then doxycycline and then Augmentin.  She does not feel that any of those antibiotics had an appreciable impact on her symptoms.  She also had a short course of prednisone though she cannot remember the exact duration (? 5 days), which might have had a minimal benefit for her.  She had diffuse myalgias but no arthralgias or rashes.  A pulmonary consult was requested by the Dr. Felipa Furnace of the hospitalist service due to her persistent respiratory symptoms    In the ED today she was noted to be febrile to 102.7.  Her oxygen saturation was 94% on room air.  Lab testing was notable for her being influenza B+.  She had a mildly elevated CRP of 1.01 and an elevated D-dimer.  Procalcitonin was unremarkable.  Her white count was normal at 7.3.  She had no eosinophilia.  Chemistries were relatively unremarkable.  Troponin was negative and her BNP was minimally elevated.  Given the elevated D-dimer, a CTA was performed which was negative for pulmonary emboli but did have a few scattered interstitial and groundglass opacities.  Her EKG was nonischemic.  A pulmonary consult was requested by the hospitalist service due to her persistent respiratory symptoms.    PMH: HTN, DM2, neuropathic pain and osteopenia.  She has no prior history of recurrent infections.      ALL:  NKDA and  no other allergy history.    SH: She is a never smoker.  She had some secondhand smoke exposure as a child as her parents were smokers.  She cannot think of any obvious new exposures that could have triggered any symptoms other than spending some time in September in her daughter's apartment in [place] which she thinks may have had some mold.  Other than the trip to [place], she has had no unusual travel.  She has no pets.  There have been no changes to her living situation.  She has no concerning occupational exposures.    FH: She had a brother who died of cystic fibrosis.  She reports that she and her other siblings were tested for CF and no one else in the family was diagnosed with CF other than the 1 brother.    Medications  Scheduled  enoxaparin, 40 mg, subcutaneous, Daily  levoFLOXacin, 750 mg, intravenous, q48h  lisinopril, 20 mg, oral, Daily  oseltamivir, 30 mg, oral, BID    Problem List:  Principal Problem:    Influenza B    Assessment & Plan:    Impression:    1.  Fever  2.  Abnormal chest CT -at this point the etiology of her symptoms is not entirely clear.  Given her fevers, infection obviously needs to be considered but her WBCs are normal and her procalcitonin is unremarkable making bacterial infection somewhat less likely.  She is influenza positive which certainly  could be accounting for some of her symptoms although the persistence seems somewhat unusual.  A noninfectious inflammatory process also needs to be considered including the possibility of a post-COVID organizing pneumonia.  Pulmonary edema also needs to be concerned about his felt somewhat less likely.  3.  Dyspnea  4.  Subacute cough  5.  Influenza    Plan:    -Agree with use of Tamiflu although the timing of when she developed symptoms related to influenza is unclear so the utility may be blunted depending on how long she has actually been infected with influenza B  -As above, given the normal WBC and negative procalcitonin, bacterial  pneumonia would seem somewhat less likely but reasonable to continue antibiotics in the short-term  -Would suggest sending urine Legionella on the off chance that she has Legionella pneumonia that has been undertreated by short courses of azithromycin and Doxy but my clinical suspicion of Legionella is not high  -I will send off basic inflammatory and autoimmune labs given the possibility of a noninfectious inflammatory process.    -A fungal process would seem somewhat less likely but given her possible mold exposure, I will send off beta D glucan, LDH and galactomannan  -I would hold off on steroids at the moment as they may not be helpful in influenza; however, if this is post COVID OP, she will likely need steroids so I will continue to monitor to assess her response to Tamiflu.  -Her history would not strongly suggest a cardiac etiology and her BNP was only minimally elevated but I think it is reasonable to keep her fluid balance negative in case there is any component of edema.  Agree with obtaining an echocardiogram to make sure there is not a cardiac cause given the mild BNP elevation.  -I spoke with Dr. Felipa Furnace about the patient today in the [facility] ED.    VS:  Temp:  [36.7 C (98.1 F)-39.3 C (102.7 F)] 37.1 C (98.7 F)  Pulse:  [76-111] 87  Resp:  [19-24] 19  SpO2:  [94 %-96 %] 94 %  BP: (126-158)/(68-92) 132/79    24hr I/Os    Intake/Output Summary (Last 24 hours) at 08/12/2023 2111  Last data filed at 08/12/2023 1900  Gross per 24 hour   Intake 1872 ml   Output 260 ml   Net 1612 ml     Physical Exam  She is tired appearing but in NAD.  Heart is regular with possible soft flow murmur.  Lungs have a few scattered crackles, somewhat more pronounced on the right.  Abdomen is soft, nontender, nondistended with bowel sounds.  Extremities are without cyanosis, clubbing or edema.  Skin is warm and well-perfused.  She has good DP pulses.  Neuro is grossly nonfocal.    Labs:  Lab Results   Component Value Date     WBC 7.3 08/12/2023    HGB 13.2 08/12/2023    HCT 40.8 08/12/2023    MCV 89.7 08/12/2023    PLT 390 08/12/2023     Lab Results   Component Value Date    GLUCOSE 92 08/12/2023    CALCIUM 8.8 08/12/2023    NA 138 08/12/2023    K 4.1 08/12/2023    CO2 26 08/12/2023    CL 107 08/12/2023    BUN 10 08/12/2023    CREATININE 1.13 08/12/2023     Lab Results   Component Value Date    ALT 16 08/12/2023    AST 21 08/12/2023  ALKPHOS 131 (H) 08/12/2023    BILITOT 0.4 08/12/2023       Radiology:  === 08/12/23 ===    XR CHEST 2 VIEWS    - Impression -  No active disease.    Nolon Bussing [facility] 08/12/2023 10:18 AM    Mamie Laurel, MD

## 2023-08-12 NOTE — H&P (Signed)
HOSPITALIST ADMISSION NOTE      08/12/2023  4:37 PM    Patient: Molly Jensen  DOB: 06/26/1951  PCP: Marlan Palau, MD      CHIEF COMPLAINT:       Shortness of breath, persistent cough, feeling unwell, weakness.    HISTORY OF PRESENT ILLNESS:     Molly Jensen is a 72 year old female with history of type 2 diabetes, hypertension, chronic pain syndrome with neuropathy, osteopenia, who presents with progressively worsening symptoms of shortness of breath, cough, fever, not responding to outpatient antibiotic medication.  The patient states that she started feeling unwell with flulike symptoms back in September while in [place], was diagnosed with strep throat at which time she was prescribed with azithromycin.  She was then diagnosed with COVID in October, of note she has never been vaccinated for COVID.  It took her about 1 month or so to start feeling better however at the beginning of December she started having flulike symptoms again with cough, congestion, fever on and off, initially chest pain, wheezing.  She saw her PCP on multiple visits, she was prescribed a sixpack course of azithromycin on 12/06 without improvement, saw PCP and was prescribed with doxycycline on 12/09 for 10 days, then because of persistent symptoms so again the PCP who prescribed her with Augmentin on 12/12 which she also finished a course of 10 days.  Despite the dose, in addition to codeine syrup, short course of prednisone, course of inhaler, she did not see much of improvement.  Today, upon PCP follow-up and given that she continued to feel poorly, she did she was recommended to come to the ER for further evaluation.  In the ER, she was noted febrile up to 102.7, sinus tach he with several PVCs, saturating 94 to 95% on room air.  Labs show normal lactic acid, negative procalcitonin test, mild elevation of BNP, normal white blood cell count without left shift.  D-dimer was elevated, subsequently a CT angiogram of the chest was done which  showed mild nonspecific diffuse interstitial prominence, could be related to atypical pneumonia or edema in the appropriate clinical setting.  No evidence of pulmonary embolism.  The patient was status tested positive for flu B, negative for COVID or flu A.  Blood cultures were drawn.  Twelve-lead EKG with sinus tachycardia with multiple PVCs.  The patient is now being admitted for further care.    REVIEW OF SYSTEMS:     12 ROS checked and negative otherwise than what is mentioned in the HPI    MEDICAL & SURGICAL HX:      Past Medical History:   Diagnosis Date    Hypertension (CMS-HCC)      Patient Active Problem List   Diagnosis    Primary osteoarthritis of right knee    Primary osteoarthritis of left knee    Influenza B     Past Surgical History:   Procedure Laterality Date    CHOLECYSTECTOMY      CTA CHEST W AND WO CONTRAST  08/12/2023    CTA CHEST W AND WO CONTRAST 08/12/2023 [facility] CT    HYSTERECTOMY         FAMILY HX:       No family history on file.    SOCIAL HX:     Social History     Socioeconomic History    Marital status: Widowed     Spouse name: Not on file    Number of children: Not on file  Years of education: Not on file    Highest education level: Not on file   Occupational History    Not on file   Tobacco Use    Smoking status: Never    Smokeless tobacco: Never   Vaping Use    Vaping status: Never Used   Substance and Sexual Activity    Alcohol use: Never    Drug use: Never    Sexual activity: Defer   Other Topics Concern    Not on file   Social History Narrative    Not on file     Social Determinants of Health     Financial Resource Strain: Not on file   Food Insecurity: Not on file   Transportation Needs: Not on file   Physical Activity: Not on file   Stress: Not on file   Social Connections: Not on file   Intimate Partner Violence: Not on file   Housing Stability: Not on file       ALLERGIES:      Allergies   Allergen Reactions    Latex        OUTPATIENT MEDICATION:     No current  facility-administered medications on file prior to encounter.     Current Outpatient Medications on File Prior to Encounter   Medication Sig Dispense Refill    alendronate (Fosamax) 70 mg tablet 1 one tablet Orally every seven days      allopurinol (Zyloprim) 300 mg tablet allopurinol 300 mg tablet      ALPRAZolam (Xanax) 0.5 mg tablet every 12 (twelve) hours.      baclofen (Lioresal) 10 mg tablet baclofen 10 mg tablet      bisacodyl (Dulcolax) 10 mg suppository 1 (one) time each day at the same time.      citalopram (CeleXA) 20 mg tablet citalopram 20 mg tablet      colchicine 0.6 mg capsule 1 capsule.      fluocinolone (DermOtic) 0.01 % ear drops every 12 (twelve) hours.      gabapentin (Neurontin) 100 mg capsule Take by mouth.      lisinopril 10 mg tablet Take 10 mg by mouth in the morning.      metFORMIN (Glucophage) 500 mg tablet 1 (one) time each day at the same time.      pantoprazole (ProtoNix) 40 mg EC tablet 1 (one) time each day at the same time.      phentermine 15 mg capsule Take by mouth.      predniSONE (Deltasone) 10 mg tablet Take by mouth.      traMADol (Ultram) 50 mg tablet Take by mouth.          PHYSICAL EXAMINATION:     Temp:  [36.9 C (98.5 F)-39.3 C (102.7 F)] 36.9 C (98.5 F)  Pulse:  [83-111] 83  Resp:  [22-24] 24  SpO2:  [94 %-96 %] 94 %  BP: (126-158)/(68-92) 126/68    Oxygen Therapy  SpO2: (!) 94 %  Patient Activity During SpO2 Measurement: At rest  Oxygen Therapy: None (Room air)      Intake/Output Summary (Last 24 hours) at 08/12/2023 1637  Last data filed at 08/12/2023 1340  Gross per 24 hour   Intake 500 ml   Output --   Net 500 ml        General: Laying in bed, in mild distress.    HEENT: Normocephalic, atraumatic, PERRLA, oral mucosa dry.  Neck: Supple, normal ROM  Respiratory: CTAB, no wheezing or crackles, not  in labored breathing  Cardiovascular: Normal rate and rhythm, no murmur  Gastrointestinal: Soft, nondistended, nontender, positive bowel sounds  Musculoskeletal: ROM  intact all extremities, no edema  Integumentary: No rash, no bruises  Neurologic: AAOx3, GCS 15, CN II to XII intact, normal sensory and motor function  Psychiatric: Calm, cooperative    LABORATORY DATA/IMAGING      Results for orders placed or performed during the hospital encounter of 08/12/23   Comprehensive metabolic panel    Collection Time: 08/12/23 10:01 AM   Result Value Ref Range    Sodium 138 135 - 146 mmol/L    Potassium 4.1 3.6 - 5.2 mmol/L    Chloride 107 98 - 110 mmol/L    CO2 (Bicarbonate) 26 20 - 32 mmol/L    Anion Gap 5 3 - 14 mmol/L    BUN 10 6 - 24 mg/dL    Creatinine 1.61 0.96 - 1.30 mg/dL    eGFRcr 52 (L) >=04 VW/UJW/1.19J*4    Glucose 92 70 - 110 mg/dL    Fasting? Unknown     Calcium 8.8 8.5 - 10.5 mg/dL    AST 21 6 - 42 U/L    ALT 16 0 - 55 U/L    Alkaline phosphatase 131 (H) 30 - 130 U/L    Protein, total 7.6 6.0 - 8.4 g/dL    Albumin 3.3 3.2 - 5.0 g/dL    Bilirubin, total 0.4 0.2 - 1.2 mg/dL   CBC w/ Differential    Collection Time: 08/12/23 10:01 AM   Result Value Ref Range    WBC 7.3 4.0 - 11.0 K/uL    RBC 4.55 3.70 - 5.20 M/uL    Hemoglobin 13.2 11.0 - 16.0 g/dL    Hematocrit 78.2 95.6 - 47.0 %    MCV 89.7 80.0 - 100.0 fL    MCH 29.0 26.0 - 34.0 pg    MCHC 32.4 31.0 - 37.0 g/dL    RDW-CV 21.3 08.6 - 57.8 %    RDW-SD 46.5 35.0 - 51.0 fL    Platelets 390 150 - 400 K/uL    MPV 8.6 (L) 9.1 - 12.4 fL    Neutrophil % 73.1 %    Lymphocyte % 13.1 %    Monocytes % 11.7 %    Eosinophils % 0.8 %    Basophils % 0.6 %    Immature Granulocytes % 0.7 %    NRBC % 0.0 0.0 - 0.0 %    Neutrophils Absolute 5.32 1.50 - 7.95 K/uL    Lymphocytes Absolute 0.95 0.70 - 4.00 K/uL    Monocytes Absolute 0.85 (H) 0.36 - 0.77 K/uL    Eosinophils Absolute 0.06 0.00 - 0.50 K/uL    Basophils Absolute 0.04 0.00 - 0.22 K/uL    Immature Granulocytes Absolute 0.05 0.00 - 0.10 K/uL    NRBC Absolute 0.00 0.00 - 2.00 K/uL   Light Blue Top    Collection Time: 08/12/23 10:01 AM   Result Value Ref Range    Extra Tube Hold for  add-ons.    SST Gold Top    Collection Time: 08/12/23 10:01 AM   Result Value Ref Range    Extra Tube Hold for add-ons.    C-reactive protein (Regular)    Collection Time: 08/12/23 10:01 AM   Result Value Ref Range    CRP 1.01 (H) 0.29 - 0.80 mg/dL   Magnesium    Collection Time: 08/12/23 10:01 AM   Result Value Ref Range    Magnesium  1.8 1.6 - 2.6 mg/dL   Protime-INR    Collection Time: 08/12/23 10:01 AM   Result Value Ref Range    Protime 11.1 9.3 - 11.6 seconds    INR 1.00 See Comment   APTT    Collection Time: 08/12/23 10:01 AM   Result Value Ref Range    aPTT 28 23 - 32 Seconds   Troponin I, High Sensitivity    Collection Time: 08/12/23 10:01 AM   Result Value Ref Range    Troponin I, High Sensitivity 17 See Comment ng/L   NT pro BNP    Collection Time: 08/12/23 10:01 AM   Result Value Ref Range    NT-proBNP 371 (H) 0 - 125 pg/mL   D-dimer, quantitative    Collection Time: 08/12/23 10:01 AM   Result Value Ref Range    D-DIMER 2,010.00 (H) 0.00 - 500.00 ng/ml FEU   Procalcitonin Test    Collection Time: 08/12/23 12:24 PM   Result Value Ref Range    Procalcitonin <0.05 ng/mL   Lactic acid    Collection Time: 08/12/23 12:24 PM   Result Value Ref Range    Lactic acid 1.7 0.4 - 2.0 mmol/L   Blood gas, venous    Collection Time: 08/12/23 12:24 PM   Result Value Ref Range    Source Blood, Venous     pH 7.38 7.32 - 7.42    pCO2 47 41 - 51 mmHg    pO2 36 mmHg    Bicarbonate (Total CO2) 25 20 - 32 mmol/L    O2 Sat (Estimated) 70 50 - 70 %    Base Excess. Calculated 2 mmol/L   SARS-CoV-2 and Influenza A/B RNA by PCR    Collection Time: 08/12/23 12:24 PM    Specimen: Nasopharyngeal; Mucosa   Result Value Ref Range    Influenza A RNA Not Detected Not Detected    Influenza B RNA Detected (A) Not Detected    SARS-CoV-2 RNA PCR Not Detected Not Detected   Lavender Top    Collection Time: 08/12/23 12:27 PM   Result Value Ref Range    Extra Tube Hold for add-ons.    Burna Mortimer Top    Collection Time: 08/12/23 12:28 PM   Result  Value Ref Range    Extra Tube Hold for add-ons.    Light Blue Top    Collection Time: 08/12/23 12:28 PM   Result Value Ref Range    Extra Tube Hold for add-ons.    SST Gold Top    Collection Time: 08/12/23 12:28 PM   Result Value Ref Range    Extra Tube Hold for add-ons.        Results       Procedure Component Value Units Date/Time    Respiratory culture [[id number]]     Order Status: Sent Specimen: Sputum Expectorated from Trachea     COVID-19 and Other Respiratory Viral PCR Tests [[id number]]  (Abnormal) Collected: 08/12/23 1224    Order Status: Completed Specimen: Mucosa from Nasopharyngeal Updated: 08/12/23 1318    Narrative:      The following orders were created for panel order COVID-19 and Other Respiratory Viral PCR Tests.  Procedure                               Abnormality         Status                     ---------                               -----------         ------  SARS-CoV-2 and Influenza.Marland Kitchen.[[id number]]  Abnormal            Final result                 Please view results for these tests on the individual orders.    Blood culture, peripheral #1 [[id number]] Collected: 08/12/23 1238    Order Status: Sent Specimen: Blood, Venous Updated: 08/12/23 1238    Blood culture, peripheral #2 [[id number]] Collected: 08/12/23 1224    Order Status: Sent Specimen: Blood, Venous Updated: 08/12/23 1238             CTA CHEST W AND WO CONTRAST   Final Result   1.  Mild nonspecific diffuse interstitial prominence, which could represent edema or atypical pneumonia in the appropriate clinical setting.  No evidence of pulmonary embolus.   2.  Other incidental findings as above.            Burnett Kanaris, MD 08/12/2023 2:11 PM      XR CHEST 2 VIEWS   Final Result   No active disease.      Nolon Bussing [facility] 08/12/2023 10:18 AM      TTE Limited - Transthoracic echo Limited    (Results Pending)        IMPRESSION/PLAN:     Nancey Padden is a 72 year old female with history of type 2 diabetes, hypertension,  chronic pain syndrome with neuropathy, osteopenia, who presents with progressively worsening symptoms of shortness of breath, cough, fever, not responding to outpatient antibiotic medication.      # Fever, progressively worsening dyspnea on exertion, probably prolonged cough.  # Flu be positive.  Admit to MedSurg.  At this time, it is not clear whether she has a new flu infection causing the symptoms, either 11 to 61-month history of possible pneumonia, vs superimposed pneumonia vs long COVID with lung inflammation manifestation in addition to superimposed flu B infection.  The patient states that she has been a lifelong non-smoker.  However, she has been exposed to secondhand smoking through her parents.  At this time, I will cover her with Tamiflu full dose, although it is meant for flu a more than flu B.  Will also cover her with Levaquin for possibility of Pseudomonas pneumonia although unlikely.   Will get sputum sample.  Tested negative for pulmonary embolism.  Although there is mild elevation of BNP, she clinically appears volume depleted -history is consistent with this, since she reports poor p.o. intake.  I will hydrate her with 1 L of NS.   I asked pulmonary for consultation on the case.  Consider ID input.  Blood cultures are pending.  Will get an echo, recently she had strep throat, rule out cardiac involvement.  Troponin first set is negative, she reports negative symptoms for angina.  Currently on RA, though Sat is borderline low.    Asked ED RN for medication reconciliation.    # Type 2 diabetes  Current blood sugar is 92.  Continue only for point-of-care checking, with prn coverage.    DVT Prophylaxis: Lovenox subcu.  Diet/Nutrition: CHO diet.  Code Status: Full code.  Dispo: MedSurg.    I have discussed the plan of care with: The patient and her daughter at the bedside in the ER.     ESTIMATED DURATION OF HOSPITALIZATION: Observation admission.    Disclaimer: This note was generated with Dragon  Medical voice recognition program.  Please excuse any errors which may have been overlooked during review.  Sometimes, these  errors may affect the content or meaning of a given sentence.    Erasmo Downer, MD  Attending physician  08/12/2023

## 2023-08-13 ENCOUNTER — Observation Stay: Admit: 2023-08-13 | Payer: MEDICARE

## 2023-08-13 LAB — POCT GLUCOSE
POCT Glucose: 102 mg/dL (ref 70–110)
POCT Glucose: 88 mg/dL (ref 70–110)
POCT Glucose: 85 mg/dL (ref 70–110)
POCT Glucose: 81 mg/dL (ref 70–110)

## 2023-08-13 LAB — BASIC METABOLIC PANEL
Anion Gap: 4 mmol/L (ref 3–14)
BUN: 9 mg/dL (ref 6–24)
CO2 (Bicarbonate): 28 mmol/L (ref 20–32)
Calcium: 8.6 mg/dL (ref 8.5–10.5)
Chloride: 107 mmol/L (ref 98–110)
Creatinine: 1.01 mg/dL (ref 0.55–1.30)
Glucose: 90 mg/dL (ref 70–110)
Potassium: 3.8 mmol/L (ref 3.6–5.2)
Sodium: 139 mmol/L (ref 135–146)
eGFRcr: 60 mL/min/{1.73_m2} (ref >=60)

## 2023-08-13 LAB — CBC
Hematocrit: 38.1 % (ref 32.0–47.0)
Hemoglobin: 12.3 g/dL (ref 11.0–16.0)
MCH: 29 pg (ref 26.0–34.0)
MCHC: 32.3 g/dL (ref 31.0–37.0)
MCV: 89.9 fL (ref 80.0–100.0)
MPV: 8.7 fL — ABNORMAL LOW (ref 9.1–12.4)
NRBC %: 0 % (ref 0.0–0.0)
NRBC Absolute: 0 10*3/uL (ref 0.00–2.00)
Platelets: 345 10*3/uL (ref 150–400)
RBC: 4.24 M/uL (ref 3.70–5.20)
RDW-CV: 15.1 % — ABNORMAL HIGH (ref 11.5–14.5)
RDW-SD: 48.6 fL (ref 35.0–51.0)
WBC: 4.2 10*3/uL (ref 4.0–11.0)

## 2023-08-13 LAB — URINALYSIS REFLEX TO CULTURE
Bilirubin, Ur: NEGATIVE
Blood, Ur: NEGATIVE
Glucose,Ur: NEGATIVE mg/dL
Ketones, Ur: NEGATIVE mg/dL
Leukocyte Esterase, Ur: NEGATIVE WBC/uL
Nitrite, Ur: NEGATIVE
Protein,Ur: NEGATIVE mg/dL
Specific Gravity, Ur: >=1.03 (ref 1.005–1.030)
Urobilinogen, Ur: 0.2 U/dL (ref 0.2–1.0)
pH, Ur: 6.5 (ref 5.0–8.0)

## 2023-08-13 LAB — LEGIONELLA ANTIGEN, URINE: LEGIONELLA ANTIGEN: NOT DETECTED

## 2023-08-13 LAB — SEDIMENTATION RATE, AUTOMATED: Sed Rate: 22 mm/h (ref 0–30)

## 2023-08-13 LAB — LDH: LDH: 274 U/L — ABNORMAL HIGH (ref 110–250)

## 2023-08-13 MED ORDER — gabapentin (Neurontin) 100 mg capsule
100 | ORAL_CAPSULE | Freq: Every evening | ORAL | 0 refills | Status: AC
Start: 2023-08-13 — End: ?

## 2023-08-13 MED ORDER — citalopram (CeleXA) 20 mg tablet
20 | ORAL_TABLET | Freq: Every day | ORAL | 0 refills | 90.00000 days | Status: AC
Start: 2023-08-13 — End: ?

## 2023-08-13 MED ORDER — oseltamivir (Tamiflu) 30 mg capsule
30 | ORAL_CAPSULE | Freq: Two times a day (BID) | ORAL | 0 refills | Status: DC
Start: 2023-08-13 — End: 2023-08-13

## 2023-08-13 MED ORDER — ALPRAZolam (Xanax) 0.5 mg tablet
0.5 | ORAL_TABLET | Freq: Two times a day (BID) | ORAL | 0 refills | Status: DC | PRN
Start: 2023-08-13 — End: 2023-09-20

## 2023-08-13 MED ORDER — oseltamivir (Tamiflu) 30 mg capsule
30 | ORAL_CAPSULE | Freq: Two times a day (BID) | ORAL | 0 refills | Status: DC
Start: 2023-08-13 — End: 2023-08-26

## 2023-08-13 MED ORDER — benzonatate (Tessalon) 100 mg capsule
100 | ORAL_CAPSULE | Freq: Three times a day (TID) | ORAL | 0 refills | 10.00000 days | Status: DC | PRN
Start: 2023-08-13 — End: 2023-08-13

## 2023-08-13 MED ORDER — benzonatate (Tessalon) 100 mg capsule
100 | ORAL_CAPSULE | Freq: Three times a day (TID) | ORAL | 0 refills | 10.00000 days | Status: DC | PRN
Start: 2023-08-13 — End: 2023-08-26

## 2023-08-13 MED FILL — BENZONATATE 100 MG CAPSULE: 100 100 mg | ORAL | Qty: 1

## 2023-08-13 MED FILL — OSELTAMIVIR 30 MG CAPSULE: 30 30 mg | ORAL | Qty: 1

## 2023-08-13 MED FILL — ENOXAPARIN 40 MG/0.4 ML SUBCUTANEOUS SYRINGE: 40 40 mg/0.4 mL | SUBCUTANEOUS | Qty: 0.4

## 2023-08-13 MED FILL — LISINOPRIL 20 MG TABLET: 20 20 mg | ORAL | Qty: 1

## 2023-08-13 MED FILL — ACETAMINOPHEN 325 MG TABLET: 325 325 mg | ORAL | Qty: 2

## 2023-08-13 MED FILL — ACETAMINOPHEN 325 MG/10.15 ML ORAL SOLUTION: 325 325 mg/10.15 mL | ORAL | Qty: 20.3

## 2023-08-13 MED FILL — LEVOFLOXACIN 750 MG/150 ML IN 5 % DEXTROSE INTRAVENOUS PIGGYBACK: 750 750 mg/150 mL | INTRAVENOUS | Qty: 150

## 2023-08-13 NOTE — Progress Notes (Signed)
08/13/23 1440   Case Management Discharge Note    Ambulance Log  Ambulance not needed     Case Management Progress Note:   Patient cleared for discharge home, self-care. No CM needs identified.   Tod Persia, RN  08/13/23  2:40 PM

## 2023-08-13 NOTE — Care Plan (Signed)
Goal Outcome Evaluation:  Plan of Care Reviewed With: patient  Progress: no change  Outcome Evaluation: Patient rested quietly overnight and without complaints, continuing on NS at  100 ml/hr. At change of shift, patient complained of severe h/a but specifically requested PRN tylenol, which was administered. Call bell within reach, safety maintained.

## 2023-08-13 NOTE — Progress Notes (Signed)
08/13/23 1315   Case Management Initial Assessment   Patient Discharged Before Able to Interview/Assess No   Source of Information Patient   Type of Residence Multilevel Home   Lives With Alone   Discharge Services Anticipated at this Time Unknown at this time   Patient Information   Interpreter Needs None   Home Caregiver Self   Accompanied by Family   Support Systems Children/Family   PTA Functional Status   Stairs to [employer] 1   Stairs Inside Home   (1 FOS)   Current Functional Status   Bathing Independent   Toileting Independent   Walking in Home Independent   Community Mobility Independent   IADL Assistance Needed No   Discharge Planning   Patient/Family/Caregiver Discharge Preference Home w/ Services   Anticipated Discharge Disposition Home w/ VNA     Case Management Progress Note:   [facility] x 2 weeks, finished antibiotics at home but had no relief. Now feels worse, with more fatigue and weakness. In ED was found to be Flu B +. Also noted to have persisting PNA, started on IV antibiotics and tamiflu. Pum consulted.     Patient lives at home alone. Independent with ADLs and IADLs. No home services. Hx of outpatient PT. + PCP. + Insurance. CM following.   Tod Persia, RN  08/13/23  1:20 PM

## 2023-08-13 NOTE — Discharge Summary (Signed)
HOSPITALIST DISCHARGE SUMMARY      DATE OF ADMISSION: 08/12/2023  DATE OF DISCHARGE: 08/14/2023  Number of days in the hospital: Hospital Day: 2    CODE STATUS     Full Code     ADMISSION DIAGNOSIS:   Shortness of breath, persistent cough, feeling unwell, weakness.    DISCHARGE DIAGNOSIS:     Acute: Influenza B, generalized weakness.  Fever related to influenza B, dyspnea, subacute cough.    Chronic: Type 2 diabetes.    PROCEDURES AND TREATMENT PROVIDED:       CONSULTS: Pulmonology.      ADMISSION HISTORY   Admission history copy pasted from admitting physician's note.    Molly Jensen is a 72 year old female with history of type 2 diabetes, hypertension, chronic pain syndrome with neuropathy, osteopenia, who presents with progressively worsening symptoms of shortness of breath, cough, fever, not responding to outpatient antibiotic medication.  The patient states that she started feeling unwell with flulike symptoms back in September while in [place], was diagnosed with strep throat at which time she was prescribed with azithromycin.  She was then diagnosed with COVID in October, of note she has never been vaccinated for COVID.  It took her about 1 month or so to start feeling better however at the beginning of December she started having flulike symptoms again with cough, congestion, fever on and off, initially chest pain, wheezing.  She saw her PCP on multiple visits, she was prescribed a sixpack course of azithromycin on 12/06 without improvement, saw PCP and was prescribed with doxycycline on 12/09 for 10 days, then because of persistent symptoms so again the PCP who prescribed her with Augmentin on 12/12 which she also finished a course of 10 days.  Despite the dose, in addition to codeine syrup, short course of prednisone, course of inhaler, she did not see much of improvement.  Today, upon PCP follow-up and given that she continued to feel poorly, she did she was recommended to come to the ER for further  evaluation.  In the ER, she was noted febrile up to 102.7, sinus tach he with several PVCs, saturating 94 to 95% on room air.  Labs show normal lactic acid, negative procalcitonin test, mild elevation of BNP, normal white blood cell count without left shift.  D-dimer was elevated, subsequently a CT angiogram of the chest was done which showed mild nonspecific diffuse interstitial prominence, could be related to atypical pneumonia or edema in the appropriate clinical setting.  No evidence of pulmonary embolism.  The patient was status tested positive for flu B, negative for COVID or flu A.  Blood cultures were drawn.  Twelve-lead EKG with sinus tachycardia with multiple PVCs.  The patient is now being admitted for further care.     HOSPITAL COURSE:   # Fever, progressively worsening dyspnea on exertion, probably prolonged cough.  # Flu be positive.     The patient was admitted to the medical surgical floor.  He was evaluated by pulmonology.  The patient underwent a CT of the chest which showed nonspecific diffuse interstitial prominence, no evidence of PE.  Agreed with use of Tamiflu although the timing of when she developed symptoms related to influenza is a clear show the utility may be blunted depending on how long she has actually been infected with influenza B.  Given the normal white blood cell count and negative procalcitonin, bacterial pneumonia would seem somewhat less likely, however, the patient was started on empiric Levaquin to cover the  atypical pneumonia and to Legionella urine Legionella came back negative.  Clinically improved hence Levaquin was discontinued and I contacted the pulmonologist on service and the day of discharge and suggested to discontinue Levaquin and only continue the Tamiflu at this time.  Pulmonology sent basic inflammatory/autoimmune labs given the possibility of a noninfectious inflammatory process.  A fungal process seems somewhat less likely but given the possible mold  exposure, pulmonology send of beta D glucan, LDH and galactomannan.  Rheumatoid factor came back as normal.  Rest of labs are pending at the time of this dictation.  There was no indication for steroids as they may not be helpful in influenza and here clinical presentation given dramatic improvement on day 2 of admission, suggested a rather unlike long COVID presentation.  Here symptoms did not suggest a cardiac etiology, BNP was only minimally elevated but reasonable to check an echo to make sure there is no cardiac cause given the mild BNP elevation.  In fact, he had an echo was rather unremarkable.  Clinically improved, okay to discharge to home, with clear instructions to the patient that should fever recur,, worsening dyspnea in addition to worsening cough, she should seek out further medical attention.     VITAL SIGNS AND PHYSICAL EXAMINATION AT THE TIME OF DISCHARGE:     Visit Vitals  BP 132/81 (BP Location: Left arm, Patient Position: Semi-Fowler)   Pulse 90   Temp 37 C (98.6 F) (Oral)   Resp 18       PHYSICAL EXAMINATION:  General: Sitting comfortably on the bed, not in distress  HEENT: Normocephalic, atraumatic, PERRLA, oral mucosa moist  Neck: Supple, normal ROM  Respiratory: CTAB, no wheezing or crackles, not in labored breathing  Cardiovascular: Normal rate and rhythm, no murmur  Gastrointestinal: Soft, nondistended, nontender, positive bowel sounds  Musculoskeletal: ROM intact all extremities, no edema  Integumentary: No rash, no bruises  Neurologic: AAOx3, GCS 15, CN II to XII intact, normal sensory and motor function  Psychiatric: Calm, cooperative    TTE Complete - Transthoracic Echo Complete   Final Result      CTA CHEST W AND WO CONTRAST   Final Result   1.  Mild nonspecific diffuse interstitial prominence, which could represent edema or atypical pneumonia in the appropriate clinical setting.  No evidence of pulmonary embolus.   2.  Other incidental findings as above.            Burnett Kanaris, MD 08/12/2023 2:11 PM      XR CHEST 2 VIEWS   Final Result   No active disease.      Nolon Bussing [facility] 08/12/2023 10:18 AM           DISCHARGE TO:   Home.    DISCHARGE CONDITION:   Stable.    DISCHARGE MEDICATIONS:        Your medication list        START taking these medications        Instructions Last Dose Given Next Dose Due   benzonatate 100 mg capsule  Commonly known as: Tessalon      Take 1 capsule (100 mg) by mouth if needed in the morning, at noon, and at bedtime for cough for up to 7 days. Do not crush or chew.       oseltamivir 30 mg capsule  Commonly known as: Tamiflu      Take 1 capsule (30 mg) by mouth twice daily for 8 doses.  CHANGE how you take these medications        Instructions Last Dose Given Next Dose Due   ALPRAZolam 0.5 mg tablet  Commonly known as: Xanax  What changed:   how much to take  how to take this  when to take this  reasons to take this      Take 1 tablet (0.5 mg) by mouth if needed in the morning and at bedtime for anxiety.       citalopram 20 mg tablet  Commonly known as: CeleXA  What changed: See the new instructions.      Take 1 tablet (20 mg) by mouth once daily.       gabapentin 100 mg capsule  Commonly known as: Neurontin  What changed:   how much to take  when to take this      Take 1 capsule (100 mg) by mouth at bedtime.              CONTINUE taking these medications        Instructions Last Dose Given Next Dose Due   lisinopril 10 mg tablet      Take 10 mg by mouth in the morning.       traMADol 50 mg tablet  Commonly known as: Ultram      Take 100 mg by mouth at bedtime.              STOP taking these medications      allopurinol 300 mg tablet  Commonly known as: Zyloprim        baclofen 10 mg tablet  Commonly known as: Lioresal        bisacodyl 10 mg suppository  Commonly known as: Dulcolax        colchicine 0.6 mg capsule        fluocinolone 0.01 % ear drops  Commonly known as: DermOtic        Fosamax 70 mg tablet  Generic drug: alendronate         metFORMIN 500 mg tablet  Commonly known as: Glucophage        pantoprazole 40 mg EC tablet  Commonly known as: ProtoNix        phentermine 15 mg capsule        predniSONE 10 mg tablet  Commonly known as: Deltasone                  Where to Get Your Medications        These medications were sent to [id number] - [place], [place] - [address] AT [facility] [address]  [address], [place] [address]      Phone: [phone]   gabapentin 100 mg capsule       These medications were sent to [facility][id number] - [place], [place] - [address]  [address], [place] [address]      Hours: 24-hours Phone: [phone]   benzonatate 100 mg capsule  oseltamivir 30 mg capsule       Information about where to get these medications is not yet available    Ask your nurse or doctor about these medications  ALPRAZolam 0.5 mg tablet  citalopram 20 mg tablet          DISCHARGE INSTRUCTIONS:   Follow-up with PCP within 1 week postdischarge as a posthospitalization follow-up.  Continue with CHO diet.  The patient  was advised to follow-up with PCP should fever occurs despite current treatment, has progressively worsening dyspnea as well as cough and she will need further evaluation.    PENDING LABS:   Anti-inflammatory/autoimmune labs as mentioned above.    OUTPATIENT FOLLOW UP:     Marlan Palau, MD     CC: Please send a copy of the discharge summary to patient's primary care doctor.    FACE TO FACE PATIENT COUNSELLING COORDINATING CARE MORE THAN 50% OF ENCOUNTER TIME : YES    TOTAL ENCOUNTER TIME: .    Disclaimer: This note was generated with Dragon Medical voice recognition program.  Please excuse any errors which may have been overlooked during review.  Sometimes, these errors may affect the content or meaning of a given sentence.    Erasmo Downer, MD  Hospitalist  [facility]patient Specialist  August 14, 2023

## 2023-08-13 NOTE — Nursing Note (Signed)
Patient and daughter were provided with discharge info, no further questions at this time. Patient left via wheelchair to the lobby with daughter with belongings.

## 2023-08-13 NOTE — Other (Signed)
Patient Education  Table of Contents   Influenza, Adult    To view videos and all your education online visit,  https://pe.elsevier.com/[email or web address]  or scan this QR code with your smartphone.  Access to this content will expire in one year.  Influenza, Adult  Influenza is also called the flu. It's an infection that affects your respiratory tract. This includes your nose, throat, windpipe, and lungs.  The flu is contagious. This means it spreads easily from person to person. It causes symptoms that are like a cold. It can also cause a high fever and body aches.  What are the causes?  The flu is caused by the influenza virus. You can get it by:   Breathing in droplets that are in the air after an infected person coughs or sneezes.   Touching something that has the virus on it and then touching your mouth, nose, or eyes.  What increases the risk?  You may be more likely to get the flu if:   You don't wash your hands often.   You're near a lot of people during cold and flu season.   You touch your mouth, eyes, or nose without washing your hands first.   You don't get a flu shot each year.  You may also be more at risk for the flu and serious problems, such as a lung infection called pneumonia, if:   You're older than 65.   You're pregnant.   Your immune system is weak. Your immune system is your body's defense system.   You have a long-term, or chronic, condition, such as:  ? Heart, kidney, or lung disease.  ? Diabetes.  ? A liver disorder.  ? Asthma.   You're very overweight.   You have anemia. This is when you don't have enough red blood cells in your body.  What are the signs or symptoms?  Flu symptoms often start all of a sudden. They may last 4?14 days and include:   Fever and chills.   Headaches, body aches, or muscle aches.   Sore throat.   Cough.   Runny or stuffy nose.   Discomfort in your chest.   Not wanting to eat as much as normal.   Feeling weak or tired.   Feeling dizzy.   Nausea or vomiting.  How is this  diagnosed?  The flu may be diagnosed based on your symptoms and medical history. You may also have a physical exam. A swab may be taken from your nose or throat and tested for the virus.  How is this treated?  If the flu is found early, you can be treated with antiviral medicine. This may be given to you by mouth or through an IV. It can help you feel less sick and get better faster.  Taking care of yourself at home can also help your symptoms get better. Your health care provider may tell you to:   Take over-the-counter medicines.   Drink lots of fluids.  The flu often goes away on its own. If you have very bad symptoms or problems caused by the flu, you may need to be treated in a hospital.  Follow these instructions at home:  Activity   Rest as needed. Get lots of sleep.   Stay home from work or school as told by your provider.  ? Leave home only to go see your provider.  ? Do not leave home for other reasons until you don't have a fever for 24 hours without  taking medicine.  Eating and drinking   Take an oral rehydration solution (ORS). This is a drink that is sold at pharmacies and stores.   Drink enough fluid to keep your pee pale yellow.   Try to drink small amounts of clear fluids. These include water, ice chips, fruit juice mixed with water, and low-calorie sports drinks.   Try to eat bland foods that are easy to digest. These include bananas, applesauce, rice, lean meats, toast, and crackers.   Avoid drinks that have a lot of sugar or caffeine in them. These include energy drinks, regular sports drinks, and soda.   Do not drink alcohol.   Do not eat spicy or fatty foods.  General instructions       Take your medicines only as told by your provider.   Use a cool mist humidifier to add moisture to the air in your home. This can make it easier for you to breathe. You should also clean the humidifier every day. To do so:  ? Empty the water.  ? Pour clean water in.   Cover your mouth and nose when you cough or  sneeze.   Wash your hands with soap and water often and for at least 20 seconds. It's extra important to do so after you cough or sneeze. If you can't use soap and water, use hand sanitizer.  How is this prevented?     Get a flu shot every year. Ask your provider when you should get your flu shot.   Stay away from people who are sick during fall and winter. Fall and winter are cold and flu season.  Contact a health care provider if:   You get new symptoms.   You have chest pain.   You have watery poop, also called diarrhea.   You have a fever.   Your cough gets worse.   You start to have more mucus.   You feel like you may vomit, or you vomit.  Get help right away if:   You become short of breath or have trouble breathing.   Your skin or nails turn blue.   You have very bad pain or stiffness in your neck.   You get a sudden headache or pain in your face or ear.   You vomit each time you eat or drink.  These symptoms may be an emergency. Call 911 right away.   Do not wait to see if the symptoms will go away.   Do not drive yourself to the hospital.  This information is not intended to replace advice given to you by your health care provider. Make sure you discuss any questions you have with your health care provider.  Document Released: 2000-08-03 Document Updated: 2023-05-09 Document Reviewed: 2022-09-13  Elsevier Patient Education ? 2024 Else[employer]

## 2023-08-14 LAB — RHEUMATOID FACTOR: Rheumatoid Factor: 11 [IU]/mL (ref <=15)

## 2023-08-15 LAB — CYCLIC CITRUL PEPTIDE: CCP IgG/IgA Abs: 17 U (ref 0–19)

## 2023-08-16 LAB — FANA STAINING PATTERNS (REFLEX): Speckled Pattern: 1:80 {titer}

## 2023-08-16 LAB — ANA BY IFA RFLX TITER/PATTERN: ANA IFA: POSITIVE — AB

## 2023-08-17 LAB — BLOOD CULTURE
Blood Culture: NO GROWTH
Blood Culture: NO GROWTH

## 2023-08-19 LAB — BETA-D-GLUCAN 1,3
Fungitell Result: NEGATIVE
Fungitell Value: <31.25 pg/mL

## 2023-08-21 ENCOUNTER — Emergency Department: Admit: 2023-08-21 | Payer: MEDICARE

## 2023-08-21 ENCOUNTER — Inpatient Hospital Stay
Admission: EM | Admit: 2023-08-21 | Discharge: 2023-08-26 | Disposition: A | Discharge status: Home Health | Payer: MEDICARE | Admitting: Internal Medicine

## 2023-08-21 DIAGNOSIS — R55 Syncope and collapse: Secondary | ICD-10-CM

## 2023-08-21 DIAGNOSIS — S2241XA Multiple fractures of ribs, right side, initial encounter for closed fracture: Principal | ICD-10-CM

## 2023-08-21 LAB — CBC WITH DIFFERENTIAL
Basophils %: 0.1 %
Basophils Absolute: 0.01 10*3/uL (ref 0.00–0.22)
Eosinophils %: 0.6 %
Eosinophils Absolute: 0.05 10*3/uL (ref 0.00–0.50)
Hematocrit: 40.2 % (ref 32.0–47.0)
Hemoglobin: 13 g/dL (ref 11.0–16.0)
Immature Granulocytes %: 0.4 %
Immature Granulocytes Absolute: 0.03 10*3/uL (ref 0.00–0.10)
Lymphocyte %: 22.8 %
Lymphocytes Absolute: 1.85 10*3/uL (ref 0.70–4.00)
MCH: 29.1 pg (ref 26.0–34.0)
MCHC: 32.3 g/dL (ref 31.0–37.0)
MCV: 90.1 fL (ref 80.0–100.0)
MPV: 8.6 fL — ABNORMAL LOW (ref 9.1–12.4)
Monocytes %: 5.9 %
Monocytes Absolute: 0.48 10*3/uL (ref 0.36–0.77)
NRBC %: 0 % (ref 0.0–0.0)
NRBC Absolute: 0 10*3/uL (ref 0.00–2.00)
Neutrophil %: 70.2 %
Neutrophils Absolute: 5.69 10*3/uL (ref 1.50–7.95)
Platelets: 384 10*3/uL (ref 150–400)
RBC: 4.46 M/uL (ref 3.70–5.20)
RDW-CV: 14.8 % — ABNORMAL HIGH (ref 11.5–14.5)
RDW-SD: 48.7 fL (ref 35.0–51.0)
WBC: 8.1 10*3/uL (ref 4.0–11.0)

## 2023-08-21 LAB — COMPREHENSIVE METABOLIC PANEL
ALT: 20 U/L (ref 0–55)
AST: 30 U/L (ref 6–42)
Albumin: 3.5 g/dL (ref 3.2–5.0)
Alkaline phosphatase: 122 U/L (ref 30–130)
Anion Gap: 4 mmol/L (ref 3–14)
BUN: 12 mg/dL (ref 6–24)
Bilirubin, total: 0.4 mg/dL (ref 0.2–1.2)
CO2 (Bicarbonate): 27 mmol/L (ref 20–32)
Calcium: 9.2 mg/dL (ref 8.5–10.5)
Chloride: 106 mmol/L (ref 98–110)
Creatinine: 1.16 mg/dL (ref 0.55–1.30)
Glucose: 114 mg/dL — ABNORMAL HIGH (ref 70–110)
Potassium: 4.2 mmol/L (ref 3.6–5.2)
Protein, total: 7.7 g/dL (ref 6.0–8.4)
Sodium: 137 mmol/L (ref 135–146)
eGFRcr: 50 mL/min/{1.73_m2} — ABNORMAL LOW (ref >=60)

## 2023-08-21 LAB — TROPONIN I, HIGH SENSITIVITY: Troponin I, High Sensitivity: 9 ng/L

## 2023-08-21 LAB — RAINBOW DRAW SST GOLD TOP

## 2023-08-21 LAB — HEMOGLOBIN A1C
Estimated Average Glucose mg/dL (INT/EXT): 120 mg/dL
HEMOGLOBIN A1C % (INT/EXT): 5.8 % — ABNORMAL HIGH (ref <5.6)

## 2023-08-21 LAB — LIGHT BLUE TOP

## 2023-08-21 MED ORDER — ondansetron (Zofran) injection 4 mg
4 | Freq: Three times a day (TID) | INTRAMUSCULAR | Status: DC | PRN
Start: 2023-08-21 — End: 2023-08-26
  Administered 2023-08-22 – 2023-08-23 (×2): 4 mg via INTRAVENOUS

## 2023-08-21 MED ORDER — ondansetron ODT (Zofran-ODT) disintegrating tablet 4 mg
4 | Freq: Three times a day (TID) | ORAL | Status: DC | PRN
Start: 2023-08-21 — End: 2023-08-26
  Administered 2023-08-25: 15:00:00 4 mg via ORAL

## 2023-08-21 MED ORDER — acetaminophen (Tylenol) tablet 975 mg
325 | Freq: Once | ORAL | Status: AC
Start: 2023-08-21 — End: 2023-08-21
  Administered 2023-08-21: 18:00:00 975 mg via ORAL

## 2023-08-21 MED ORDER — acetaminophen (Tylenol) tablet 650 mg
325 | Freq: Four times a day (QID) | ORAL | Status: DC | PRN
Start: 2023-08-21 — End: 2023-08-26
  Administered 2023-08-22 – 2023-08-26 (×7): 650 mg via ORAL

## 2023-08-21 MED ORDER — morphine injection 4 mg
4 | Freq: Once | INTRAVENOUS | Status: DC
Start: 2023-08-21 — End: 2023-08-22

## 2023-08-21 MED ORDER — dextrose 50 % in water (D50W) solution 25-50 mL
INTRAVENOUS | Status: DC | PRN
Start: 2023-08-21 — End: 2023-08-21

## 2023-08-21 MED ORDER — oxyCODONE (Roxicodone) immediate release tablet 5 mg
5 | Freq: Once | ORAL | Status: AC
Start: 2023-08-21 — End: 2023-08-21
  Administered 2023-08-21: 20:00:00 5 mg via ORAL

## 2023-08-21 MED ORDER — glucagon injection 1 mg
1 | INTRAMUSCULAR | Status: DC | PRN
Start: 2023-08-21 — End: 2023-08-21

## 2023-08-21 MED ORDER — enoxaparin (Lovenox) syringe 40 mg
40 | Freq: Every day | SUBCUTANEOUS | Status: DC
Start: 2023-08-21 — End: 2023-08-26
  Administered 2023-08-21 – 2023-08-26 (×6): 40 mg via SUBCUTANEOUS

## 2023-08-21 MED ORDER — acetaminophen (Tylenol) suppository 650 mg
650 | Freq: Four times a day (QID) | RECTAL | Status: DC | PRN
Start: 2023-08-21 — End: 2023-08-26

## 2023-08-21 MED ORDER — insulin lispro (HumaLOG) injection 0-10 Units
100 | Freq: Three times a day (TID) | SUBCUTANEOUS | Status: DC
Start: 2023-08-21 — End: 2023-08-21

## 2023-08-21 MED ORDER — morphine injection 4 mg
4 | Freq: Once | INTRAVENOUS | Status: AC
Start: 2023-08-21 — End: 2023-08-21
  Administered 2023-08-21: 15:00:00 4 mg via INTRAVENOUS

## 2023-08-21 MED ORDER — morphine injection 2 mg
2 | INTRAMUSCULAR | Status: DC | PRN
Start: 2023-08-21 — End: 2023-08-22
  Administered 2023-08-21 – 2023-08-22 (×3): 2 mg via INTRAVENOUS

## 2023-08-21 MED ORDER — dextrose (D10W) 10 % bolus 125-250 mL
10 | INTRAVENOUS | Status: DC | PRN
Start: 2023-08-21 — End: 2023-08-21

## 2023-08-21 MED ORDER — acetaminophen (Tylenol) solution 650 mg
325 | Freq: Four times a day (QID) | ORAL | Status: DC | PRN
Start: 2023-08-21 — End: 2023-08-26

## 2023-08-21 MED ORDER — morphine injection 4 mg
4 | INTRAVENOUS | Status: DC | PRN
Start: 2023-08-21 — End: 2023-08-22
  Administered 2023-08-22: 05:00:00 4 mg via INTRAVENOUS

## 2023-08-21 MED FILL — MORPHINE 2 MG/ML INJECTION WRAPPER: 2 2 mg/mL | INTRAMUSCULAR | Qty: 1

## 2023-08-21 MED FILL — MORPHINE 4 MG/ML INTRAVENOUS SOLUTION WRAPPER: 4 4 mg/mL | INTRAVENOUS | Qty: 1

## 2023-08-21 MED FILL — ACETAMINOPHEN 325 MG TABLET: 325 325 mg | ORAL | Qty: 3

## 2023-08-21 MED FILL — OXYCODONE 5 MG TABLET: 5 5 mg | ORAL | Qty: 1

## 2023-08-21 MED FILL — ENOXAPARIN 40 MG/0.4 ML SUBCUTANEOUS SYRINGE: 40 40 mg/0.4 mL | SUBCUTANEOUS | Qty: 0.4

## 2023-08-21 NOTE — ED Triage Notes (Signed)
Pt biba from home in c-collar c/o right sided pain after fall at 0330am.. Per daughter recently tested + for flu (12/23) and has had multiple falls.  No thinners

## 2023-08-21 NOTE — Progress Notes (Signed)
08/21/23 1504   Case Management Initial Assessment   Patient Discharged Before Able to Interview/Assess No   Source of Information Family   Type of Residence Multilevel Home   Lives With Alone   Discharge Services Anticipated at this Time Yes   Expected Discharge Needs VNA;SNF   Patient Information   Interpreter Needs None   Home Caregiver Family   Accompanied by Family   Support Systems Children/Family   Need PCP on Discharge No   Current Patient Responsibilities Personal Care;Housekeeping;Meal Prep;Shopping;Driving   PTA Functional Status   Is patient prescribed an anti-coagulant? No   Current Functional Status   Bathing Independent   Toileting Independent   Walking in Home Independent   Assistive Device Used None   Community Mobility Independent     Case Management Progress Note:   Spoke to the daughter [name] for initial screen. Pt lives alone. I iwith Adls and IADLS PTA. Was recently discharged home independently on 12/24. Tested +flu 12/23. Daughter at her bedside. Daughter lives close to the daughter. Came in with multiple falls at home. Explained to the daughter that she will need a 3 midnight inpatient stay in order to qualify for rehab. Will have PT work with her. Daughter verbalized understanding. COC to follow   [name], RN  08/21/2023  3:09 PM

## 2023-08-21 NOTE — H&P (Signed)
INPATIENT H&P  [facility] [address]  [place] [address]  [phone]    Today's Date: 08/21/2023  MRN: [id number]  Name: Molly Jensen  DOB: 30-Jul-1951    Chief Complaint  Chief Complaint   Patient presents with    Fall       History Of Present Illness  Molly Jensen is a 73 y.o. female history of  diabetes, hypertension, neuropathy and recurrent syncope who presents for evaluation of increasing frequency of falls as well as syncope last night with right sided rib pain.  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  She was recently diagnosed with influenza in December and was admitted last week.  Daughter feels that she has had increasing fatigue/weakness and increasing frequency of falls over the past month.  Patient reports that last night she had a fall where she did not strike her head, but later went to the bathroom and, after sitting on the toilet, syncopized.  She woke on the ground with right sided chest wall pain and right shoulder pain.  Currently her primary complaint is the right chest wall pain        Past Medical History  She has a past medical history of Degenerative joint disease and Hypertension (CMS-HCC).    Surgical History  She has a past surgical history that includes Hysterectomy; Cholecystectomy; and CTA CHEST W AND WO CONTRAST (08/12/2023).     Social History  She reports that she has never smoked. She has never used smokeless tobacco. She reports that she does not drink alcohol and does not use drugs.    Family History  Family History   Problem Relation Name Age of Onset    No Known Problems Mother      No Known Problems Father          Advance Care Plan  Extended Emergency Contact Information  Primary Emergency Contact: Awanda Mink  Address: [address]           [place], [place] [address] [place]  Home Phone: [phone]  Mobile Phone: [phone]  Relation: Daughter  Preferred language:  English  Interpreter needed? No  Full Code       Allergies  Latex     Medications  (Not in a hospital admission)    No current facility-administered medications on file prior to encounter.     Current Outpatient Medications on File Prior to Encounter   Medication Sig Dispense Refill    ALPRAZolam (Xanax) 0.5 mg tablet Take 1 tablet (0.5 mg) by mouth if needed in the morning and at bedtime for anxiety. 30 tablet 0    [EXPIRED] benzonatate (Tessalon) 100 mg capsule Take 1 capsule (100 mg) by mouth if needed in the morning, at noon, and at bedtime for cough for up to 7 days. Do not crush or chew. 20 capsule 0    citalopram (CeleXA) 20 mg tablet Take 1 tablet (20 mg) by mouth once daily. 30 tablet 0    gabapentin (Neurontin) 100 mg capsule Take 1 capsule (100 mg) by mouth at bedtime. 30 capsule 0    lisinopril 10 mg tablet Take 10 mg by mouth in the morning.      [EXPIRED] oseltamivir (Tamiflu) 30 mg capsule Take 1 capsule (30 mg) by mouth twice daily for 8 doses. 8 capsule 0    traMADol (Ultram) 50 mg tablet Take 100 mg by mouth at bedtime.  Medications        Start Medication Dose/Rate, Route, Frequency Ordered Stop    08/21/23 1700 enoxaparin (Lovenox) syringe 40 mg         40 mg, subQ, Daily 08/21/23 1656 --    08/21/23 1656 ondansetron ODT (Zofran-ODT) disintegrating tablet 4 mg        Placed in "Or" Linked Group    4 mg, oral, Every 8 hours PRN 08/21/23 1656 --    08/21/23 1656 ondansetron (Zofran) injection 4 mg        Placed in "Or" Linked Group    4 mg, IV, Every 8 hours PRN 08/21/23 1656 --    08/21/23 1656 acetaminophen (Tylenol) tablet 650 mg        Placed in "Or" Linked Group    650 mg, oral, Every 6 hours PRN 08/21/23 1656 --    08/21/23 1656 acetaminophen (Tylenol) solution 650 mg        Placed in "Or" Linked Group    650 mg, oral, Every 6 hours PRN 08/21/23 1656 --    08/21/23 1656 acetaminophen (Tylenol) suppository 650 mg        Placed in "Or" Linked Group    650 mg, rect, Every 6 hours PRN  08/21/23 1656 --    08/21/23 1525 morphine injection 4 mg         4 mg, IV, Once 08/21/23 1524 08/28/23 1524                    Review of Systems  Please see HPI. Twelve point review of systems otherwise negative.    Objective   Last Recorded Vitals  Blood pressure (!) 143/92, pulse 80, temperature 36.6 C (97.8 F), temperature source Oral, resp. rate 18, height 1.575 m, weight 69.4 kg, SpO2 (!) 94%.  Physical Exam  Vitals and nursing note reviewed.   Constitutional:       Appearance: Normal appearance.   Pulmonary:      Effort: Pulmonary effort is normal.   Chest:      Chest wall: Tenderness present.   Musculoskeletal:      Right shoulder: Tenderness present.   Neurological:      Mental Status: She is alert.           Relevant Results  Lab Results   Component Value Date    GLUCOSE 114 (H) 08/21/2023    CALCIUM 9.2 08/21/2023    NA 137 08/21/2023    K 4.2 08/21/2023    CO2 27 08/21/2023    CL 106 08/21/2023    BUN 12 08/21/2023    CREATININE 1.16 08/21/2023     Lab Results   Component Value Date    WBC 8.1 08/21/2023    HGB 13.0 08/21/2023    HCT 40.2 08/21/2023    MCV 90.1 08/21/2023    PLT 384 08/21/2023     Lab Results   Component Value Date    ALT 20 08/21/2023    AST 30 08/21/2023    ALKPHOS 122 08/21/2023    BILITOT 0.4 08/21/2023      No results found for: "CKTOTAL", "CKMB", "CKMBINDEX", "TROPONINI"   === 08/21/23 ===    CT CHEST WO CONTRAST    - Impression -  Right posterolateral ninth and 10th rib fractures.    Darrow Bussing, MD 08/21/2023 12:21 PM   === 08/21/23 ===    XR SHOULDER 2+ VIEWS RIGHT    - Impression -  No shoulder fracture identified.  Right lateral ninth rib fracture again demonstrated.      Darrow Bussing, MD 08/21/2023 1:35 PM  Encounter Date: 08/21/23   ECG 12 lead    Narrative    HEART RATE: 85  RR Interval: 704  P-R Interval: 145  P Duration: 105  P Front Axis: 42  QRSD Interval: 92  QT Interval: 393  QTcB: 468  QTcF: 441  QRS Axis: -15  T Wave Axis: 35  QTc Framingham: 438  QTc Hodges:  437  ECG Severity:   - ABNORMAL ECG -  ECG Impression:   Sinus rhythm  Left ventricular hypertrophy  No significant change compared to prior     Cultures         Component Value Units    COVID-19 and Other Respiratory Viral PCR Tests [[id number]]     Order Status: Canceled     Lab Status: No result     Specimen Type: Mucosa     Specimen Source: Nasopharyngeal     SARS/FLU/RSV [[id number]]     Order Status: Canceled     Lab Status: No result     Specimen Type: Mucosa     Specimen Source: Nasopharyngeal                      Assessment/Plan   Principal Problem:    Syncope and collapse      Molly Jensen is a 73 y.o. female history of  diabetes, hypertension, neuropathy and recurrent syncope who presents for evaluation of increasing frequency of falls as well as syncope last night with right sided rib pain.    # Syncope and fall  # Ninth and 10th rib fractures secondary to fall  # Right shoulder pain secondary to fall  # Age-related physical debility  X-ray does not demonstrate any right shoulder fracture  Admit to MedSurg  PT evaluation  Case management consulted for placement  Pain control as needed    # Type 2 diabetes with hyperglycemia  Moderate dose sliding scale insulin ordered  HbA1c ordered    DVT prophylaxis  Full code  MedSurg    Expected length of stay is more than 48 hours      ###    I have personally reviewed and independently interpreted patient's current and prior charts including all notes, pertinent labs and imaging. I have communicated with ER attending. I have also reviewed patient's medications and any potential interactions.     Dustin Folks, MD

## 2023-08-21 NOTE — Nursing Note (Signed)
Pt arrived to unit from ED with all personal belongings. Pt oriented to room and call light within reach. All safety precautions in place.

## 2023-08-21 NOTE — Care Plan (Signed)
Goal Outcome Evaluation:  Plan of Care Reviewed With: patient  Progress: no change  Outcome Evaluation: pt A/Ox4. fair appetite. pt c/o pain, IV morphine 2mg  given with good effect. purewick in place, draining well. All safety precautions in place. Call light within reach.Continue plan of care.        Problem: Adult Inpatient Plan of Care  Goal: Plan of Care Review  08/21/2023 2236 by Eduardo Osier, RN  Outcome: Ongoing, Progressing  Flowsheets (Taken 08/21/2023 2236)  Progress: no change  Plan of Care Reviewed With: patient  Outcome Evaluation: pt A/Ox4. fair appetite. pt c/o pain, IV morphine 2mg  given with good effect. purewick in place, draining well. All safety precautions in place. Call light within reach.Continue plan of care.  08/21/2023 2235 by Eduardo Osier, RN  Outcome: Ongoing, Not Progressing  Goal: Patient-Specific Goal (Individualized)  08/21/2023 2236 by Eduardo Osier, RN  Outcome: Ongoing, Progressing  08/21/2023 2235 by Eduardo Osier, RN  Outcome: Ongoing, Not Progressing  Goal: Absence of Hospital-Acquired Illness or Injury  08/21/2023 2236 by Eduardo Osier, RN  Outcome: Ongoing, Progressing  08/21/2023 2235 by Eduardo Osier, RN  Outcome: Ongoing, Not Progressing  Goal: Optimal Comfort and Wellbeing  08/21/2023 2236 by Eduardo Osier, RN  Outcome: Ongoing, Progressing  08/21/2023 2235 by Eduardo Osier, RN  Outcome: Ongoing, Not Progressing  Goal: Readiness for Transition of Care  08/21/2023 2236 by Eduardo Osier, RN  Outcome: Ongoing, Progressing  08/21/2023 2235 by Eduardo Osier, RN  Outcome: Ongoing, Not Progressing     Problem: Pain Acute  Goal: Acceptable Pain Control and Functional Ability  08/21/2023 2236 by Eduardo Osier, RN  Outcome: Ongoing, Progressing  08/21/2023 2235 by Eduardo Osier, RN  Outcome: Ongoing, Not Progressing

## 2023-08-21 NOTE — ED Provider Notes (Signed)
[facility]  [address]  [place] [address]    PATIENT  Molly Jensen  DOB: 1950/10/18, MRN: [id number]    DATE OF SERVICE  08/21/23  Seen  in rm 10 with daughter    CHIEF COMPLAINT  Rydia is a 73 y.o. female  has a past medical history of Degenerative joint disease and Hypertension (CMS-HCC). presenting with a chief complaint of Fall      HISTORY OF PRESENT ILLNESS  73 year old female with longstanding history as above, notable for diabetes, hypertension, neuropathy and recurrent syncope who presents for evaluation of increasing frequency of falls as well as syncope last night with right sided rib pain.  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  She was recently diagnosed with influenza in December and was admitted last week.  Daughter feels that she has had increasing fatigue/weakness and increasing frequency of falls over the past month.  Patient reports that last night she had a fall where she did not strike her head, but later went to the bathroom and, after sitting on the toilet, syncopized.  She woke on the ground with right sided chest wall pain and right shoulder pain.  Currently her primary complaint is the right chest wall pain        REVIEW OF SYSTEMS  Review of Systems    PATIENT HISTORY  MEDICAL  Past Medical History:   Diagnosis Date    Degenerative joint disease     Hypertension (CMS-HCC)        SURGICAL  Past Surgical History:   Procedure Laterality Date    CHOLECYSTECTOMY      CTA CHEST W AND WO CONTRAST  08/12/2023    CTA CHEST W AND WO CONTRAST 08/12/2023 [facility] CT    HYSTERECTOMY         ALLERGIES  Allergies   Allergen Reactions    Latex        FAMILY  Family History   Problem Relation Name Age of Onset    No Known Problems Mother      No Known Problems Father         SOCIAL  Social History     Tobacco Use    Smoking status: Never    Smokeless tobacco: Never   Vaping Use    Vaping status: Never Used   Substance Use Topics     Alcohol use: Never    Drug use: Never       PHYSICAL EXAM  TRIAGE (FIRST SET) VITAL SIGNS  Temp: 36.7 C (98 F) Oral  Pulse: 88  BP: (!) 160/93  Resp: 20  SpO2: 100 % Oxygen Therapy: None (Room air)    Vitals:    08/21/23 1402   BP: (!) 145/88   Pulse: 91   Resp: 22   Temp:    SpO2: 95%       BP Readings from Last 5 Encounters:   08/21/23 (!) 145/88   08/13/23 132/81   11/11/21 (!) 133/92   09/15/21 (!) 176/90       Physical Exam  Vitals and nursing note reviewed.   Constitutional:       General: She is not in acute distress.     Appearance: Normal appearance. She is not ill-appearing.   HENT:      Head: Normocephalic and atraumatic.   Eyes:      Pupils: Pupils are equal, round, and reactive to light.   Cardiovascular:  Rate and Rhythm: Normal rate and regular rhythm.      Pulses: Normal pulses.      Heart sounds: Normal heart sounds.   Pulmonary:      Effort: Pulmonary effort is normal. No respiratory distress.      Breath sounds: Normal breath sounds.   Abdominal:      General: There is no distension.      Palpations: Abdomen is soft.      Tenderness: There is no abdominal tenderness. There is no guarding.   Musculoskeletal:      Cervical back: Normal range of motion and neck supple. No tenderness.      Comments: + r sided chest wall with sig ttp   Skin:     General: Skin is warm and dry.      Capillary Refill: Capillary refill takes less than 2 seconds.      Findings: No rash.   Neurological:      General: No focal deficit present.      Mental Status: She is alert and oriented to person, place, and time.      Cranial Nerves: No cranial nerve deficit.      Sensory: No sensory deficit.      Motor: No weakness.      Comments: Unable to safely ambulate   Psychiatric:         Mood and Affect: Mood normal.         Behavior: Behavior normal.         RESULTS  Labs Reviewed   COMPREHENSIVE METABOLIC PANEL - Abnormal       Result Value    Sodium 137      Potassium 4.2      Chloride 106      CO2 (Bicarbonate) 27       Anion Gap 4      BUN 12      Creatinine 1.16      eGFRcr 50 (*)     Glucose 114 (*)     Fasting? Unknown      Calcium 9.2      AST 30      ALT 20      Alkaline phosphatase 122      Protein, total 7.7      Albumin 3.5      Bilirubin, total 0.4     CBC WITH DIFFERENTIAL - Abnormal    WBC 8.1      RBC 4.46      Hemoglobin 13.0      Hematocrit 40.2      MCV 90.1      MCH 29.1      MCHC 32.3      RDW-CV 14.8 (*)     RDW-SD 48.7      Platelets 384      MPV 8.6 (*)     Neutrophil % 70.2      Lymphocyte % 22.8      Monocytes % 5.9      Eosinophils % 0.6      Basophils % 0.1      Immature Granulocytes % 0.4      NRBC % 0.0      Neutrophils Absolute 5.69      Lymphocytes Absolute 1.85      Monocytes Absolute 0.48      Eosinophils Absolute 0.05      Basophils Absolute 0.01      Immature Granulocytes Absolute 0.03      NRBC Absolute 0.00  TROPONIN I, HIGH SENSITIVITY - Normal    Troponin I, High Sensitivity 9      Narrative:     Clinical correlation, HEART Score and shared decision making must be taken into account.                   For Chest Pain >3 Hours    Rule-Out Criteria              Single Value     0hr/1hr Delta Value  Female  <10ng/L*          <54ng/L AND delta <15ng/L  Female    <10ng/L*          <79ng/L AND delta <15ng/L    Cannot Rule-Out                            0hr/1hr Delta Value  Female    <54ng/L AND delta >15ng/L    >/= 54 AND delta </=15ng/L  Female      <79ng/L AND delta >15ng/L    >/= 79 AND delta </=15ng/L    Rule-In Criteria               Single Value   0hr/1hr Delta Value                 Female   >115ng/L       >/=54ng/L AND delta >/=15ng/L         Female     >115ng/L       >/=79ng/L AND delta >/=15ng/L           *Note: for Chest pain <3 hours 0hr/1hr is warranted for evaluation.     CBC W/DIFF    Narrative:     The following orders were created for panel order CBC and differential.  Procedure                               Abnormality         Status                     ---------                                -----------         ------                     CBC w/ Differential[[id number]]          Abnormal            Final result                 Please view results for these tests on the individual orders.       Lab Results   Component Value Date    HGB 13.0 08/21/2023    HGB 12.3 08/13/2023    HGB 13.2 08/12/2023    HCT 40.2 08/21/2023    HCT 38.1 08/13/2023    HCT 40.8 08/12/2023    BUN 12 08/21/2023    BUN 9 08/13/2023    BUN 10 08/12/2023    TROPIHS 9 08/21/2023    TROPIHS 17 08/12/2023       XR SHOULDER RIGHT 2+ VIEWS   Final Result   No shoulder fracture identified. Right lateral ninth rib  fracture again demonstrated.         Darrow Bussing, MD 08/21/2023 1:35 PM      CT CERVICAL SPINE WO CONTRAST   Final Result      1.  Cervical spondylosis as above. No cervical spine fracture or traumatic subluxation.      2.  Heterogeneous attenuation of the lung apices.   ____________________________________________      Romana Juniper 08/21/2023 11:44 AM      CT HEAD WO CONTRAST   Final Result      1.  No acute intracranial pathology identified.       2.  Mucosal thickening in the paranasal sinuses with air-fluid level in the left maxillary sinus, may represent acute sinusitis in the appropriate clinical setting.   ____________________________________________      Romana Juniper 08/21/2023 11:50 AM      CT CHEST WO CONTRAST   Final Result   Right posterolateral ninth and 10th rib fractures.      Darrow Bussing, MD 08/21/2023 12:21 PM          ECG 12 lead   Preliminary Result by Interface, Lab Results In (01/01 0917)   HEART RATE: 85   RR Interval: 704   P-R Interval: 145   P Duration: 105   P Front Axis: 42   QRSD Interval: 92   QT Interval: 393   QTcB: 468   QTcF: 441   QRS Axis: -15   T Wave Axis: 35   QTc Framingham: 438   QTc Hodges: 437   ECG Severity:    - NORMAL ECG -   ECG Impression:    Sinus rhythm            Meds Given this ED Visit, if any:  ---  ED Medication Administration from 08/21/2023 0905 to 08/21/2023 1559          Date/Time Order Dose Route Action Action by     08/21/2023 0935 EST morphine injection 4 mg 4 mg intravenous Given Steele Sizer     08/21/2023 1255 EST acetaminophen (Tylenol) tablet 975 mg 975 mg oral Given Guilmette, E     08/21/2023 1433 EST oxyCODONE (Roxicodone) immediate release tablet 5 mg 5 mg oral Given Guilmette, E              Procedures       ED COURSE/MEDICAL DECISION MAKING  Medical Decision Making  Problems Addressed:  Closed fracture of multiple ribs of right side, initial encounter: complicated acute illness or injury     Details: CT without pneumonia nor pneumo/hemothorax. + 2 rib fxrs, no flail segments. Treated with APAP, multiple rounds of morphine and oxycodone. Ongoing pain. Discussed risks of ongoing opiate use related to fall risk, delirium, constipation, etc   Syncope, unspecified syncope type: complicated acute illness or injury     Details: Recurrent syncope, limiting ability to safely get around at her home  Unsteady gait when walking: complicated acute illness or injury     Details: Unable to safely ambulate in ED, baseline deconditioning + acute rib fractures severely limiting. No PT available to day. Consult placed, COC involved and recommended admisison    Amount and/or Complexity of Data Reviewed  Labs: ordered.  Radiology: ordered.  ECG/medicine tests: ordered.    Risk  OTC drugs.  Prescription drug management.         Nursing notes reviewed and agreed with.      EXTERNAL RECORDS REVIEWED  Reviewed DC summary from most  recent hospitalization    MY INDEPENDENT INTERPRETATIONS  CT head without mass effect or hemorrhage on my contemporaneous review and independent interpretation    DISCUSSION WITH OTHER PROVIDERS  Discussed hx, exam, imaging, labs with Dr Orlie Pollen (admitting hospitalist), accepted for admission to med surg    SOCIAL DETERMINATES OF HEALTH   Significantly limiting management (diagnosis or treatment)    INFORMANTS  Pt and daughter provided independent  histories    Diagnoses as of 08/21/23 1612   Syncope, unspecified syncope type   Closed fracture of multiple ribs of right side, initial encounter   Unsteady gait when walking          Elicia Lamp, MD  08/22/23 1321

## 2023-08-22 DIAGNOSIS — R55 Syncope and collapse: Secondary | ICD-10-CM

## 2023-08-22 LAB — CBC
Hematocrit: 41.6 % (ref 32.0–47.0)
Hemoglobin: 13.1 g/dL (ref 11.0–16.0)
MCH: 29.2 pg (ref 26.0–34.0)
MCHC: 31.5 g/dL (ref 31.0–37.0)
MCV: 92.7 fL (ref 80.0–100.0)
MPV: 8.8 fL — ABNORMAL LOW (ref 9.1–12.4)
NRBC %: 0 % (ref 0.0–0.0)
NRBC Absolute: 0 10*3/uL (ref 0.00–2.00)
Platelets: 400 10*3/uL (ref 150–400)
RBC: 4.49 M/uL (ref 3.70–5.20)
RDW-CV: 15.5 % — ABNORMAL HIGH (ref 11.5–14.5)
RDW-SD: 52.2 fL — ABNORMAL HIGH (ref 35.0–51.0)
WBC: 7.4 10*3/uL (ref 4.0–11.0)

## 2023-08-22 LAB — BASIC METABOLIC PANEL
Anion Gap: 6 mmol/L (ref 3–14)
BUN: 14 mg/dL (ref 6–24)
CO2 (Bicarbonate): 27 mmol/L (ref 20–32)
Calcium: 9 mg/dL (ref 8.5–10.5)
Chloride: 105 mmol/L (ref 98–110)
Creatinine: 1.06 mg/dL (ref 0.55–1.30)
Glucose: 101 mg/dL (ref 70–110)
Potassium: 3.9 mmol/L (ref 3.6–5.2)
Sodium: 138 mmol/L (ref 135–146)
eGFRcr: 56 mL/min/{1.73_m2} — ABNORMAL LOW (ref >=60)

## 2023-08-22 MED ORDER — lidocaine 4 % patch 1 patch
4 | Freq: Every day | TOPICAL | Status: DC
Start: 2023-08-22 — End: 2023-08-26
  Administered 2023-08-22 – 2023-08-26 (×5): 1 via TOPICAL

## 2023-08-22 MED ORDER — oxyCODONE (Roxicodone) immediate release tablet 7.5 mg
5 | Freq: Four times a day (QID) | ORAL | Status: DC | PRN
Start: 2023-08-22 — End: 2023-08-23
  Administered 2023-08-22 – 2023-08-23 (×2): 7.5 mg via ORAL

## 2023-08-22 MED ORDER — oxyCODONE (Roxicodone) immediate release tablet 5 mg
5 | Freq: Four times a day (QID) | ORAL | Status: DC | PRN
Start: 2023-08-22 — End: 2023-08-22
  Administered 2023-08-22: 15:00:00 5 mg via ORAL

## 2023-08-22 MED ORDER — gabapentin (Neurontin) capsule 100 mg
100 | Freq: Every evening | ORAL | Status: DC
Start: 2023-08-22 — End: 2023-08-26
  Administered 2023-08-23 – 2023-08-26 (×4): 100 mg via ORAL

## 2023-08-22 MED ORDER — citalopram (CeleXA) tablet 20 mg
20 | Freq: Every day | ORAL | Status: DC
Start: 2023-08-22 — End: 2023-08-26
  Administered 2023-08-22 – 2023-08-26 (×5): 20 mg via ORAL

## 2023-08-22 MED FILL — LIDOCAINE 4 % TOPICAL PATCH: 4 4 % | TOPICAL | Qty: 1

## 2023-08-22 MED FILL — MORPHINE 2 MG/ML INJECTION WRAPPER: 2 2 mg/mL | INTRAMUSCULAR | Qty: 1

## 2023-08-22 MED FILL — CITALOPRAM 20 MG TABLET: 20 20 mg | ORAL | Qty: 1

## 2023-08-22 MED FILL — ENOXAPARIN 40 MG/0.4 ML SUBCUTANEOUS SYRINGE: 40 40 mg/0.4 mL | SUBCUTANEOUS | Qty: 0.4

## 2023-08-22 MED FILL — OXYCODONE 5 MG TABLET: 5 5 mg | ORAL | Qty: 1

## 2023-08-22 MED FILL — OXYCODONE 5 MG TABLET: 5 5 mg | ORAL | Qty: 2

## 2023-08-22 MED FILL — ONDANSETRON HCL (PF) 4 MG/2 ML INJECTION SOLUTION: 4 4 mg/2 mL | INTRAMUSCULAR | Qty: 2

## 2023-08-22 MED FILL — MORPHINE 4 MG/ML INTRAVENOUS SOLUTION WRAPPER: 4 4 mg/mL | INTRAVENOUS | Qty: 1

## 2023-08-22 MED FILL — ACETAMINOPHEN 325 MG TABLET: 325 325 mg | ORAL | Qty: 2

## 2023-08-22 NOTE — Progress Notes (Signed)
PROGRESS NOTE    Today's Date: 08/22/2023  MRN: [id number]  Name: Molly Jensen  DOB: [birth year]    Subjective   I met with the patient and her daughter at the bedside.  Daughter confirmed that the patient has been unsteady on her feet and has had multiple falls for the past 6 months.  However, there is no real report of loss of consciousness or lightheadedness before this last fall which brought her to the hospital.  The patient had gone to the bathroom for urinating and had gotten off the commode when she had the syncopal episode    She still reporting rib pain.  Oxycodone takes the edge off.  I agree to increasing dose of oxycodone but I also informed daughter and patient that oxycodone would increase the likelihood of having falls    12 point review of systems performed and found to be negative except as mentioned above.        Objective     Last Recorded Vitals  Blood pressure 123/79, pulse 76, temperature 36.6 C (97.8 F), temperature source Oral, resp. rate 18, height 1.575 m, weight 68.9 kg, SpO2 95%.    Physical Exam  Vitals and nursing note reviewed.   Constitutional:       Appearance: Normal appearance.   Abdominal:      General: Abdomen is flat.      Palpations: Abdomen is soft.   Neurological:      Mental Status: She is alert. Mental status is at baseline.   Psychiatric:         Mood and Affect: Mood normal.                   Medications        Start Medication Dose/Rate, Route, Frequency Ordered Stop    08/22/23 2200 gabapentin (Neurontin) capsule 100 mg         100 mg, oral, Nightly 08/22/23 1003 --    08/22/23 1015 citalopram (CeleXA) tablet 20 mg         20 mg, oral, Daily 08/22/23 1003 --    08/22/23 0945 lidocaine 4 % patch 1 patch         1 patch, top, Daily 08/22/23 0923 --    08/22/23 0923 oxyCODONE (Roxicodone) immediate release tablet 5 mg         5 mg, oral, Every 6 hours PRN 08/22/23 0924 08/29/23 0922    08/21/23 1700 enoxaparin (Lovenox) syringe 40 mg         40 mg, subQ, Daily 08/21/23 1656  --    08/21/23 1656 ondansetron ODT (Zofran-ODT) disintegrating tablet 4 mg        Placed in "Or" Linked Group    4 mg, oral, Every 8 hours PRN 08/21/23 1656 --    08/21/23 1656 ondansetron (Zofran) injection 4 mg        Placed in "Or" Linked Group    4 mg, IV, Every 8 hours PRN 08/21/23 1656 --    08/21/23 1656 acetaminophen (Tylenol) tablet 650 mg        Placed in "Or" Linked Group    650 mg, oral, Every 6 hours PRN 08/21/23 1656 --    08/21/23 1656 acetaminophen (Tylenol) solution 650 mg        Placed in "Or" Linked Group    650 mg, oral, Every 6 hours PRN 08/21/23 1656 --    08/21/23 1656 acetaminophen (Tylenol) suppository 650 mg  Placed in "Or" Linked Group    650 mg, rect, Every 6 hours PRN 08/21/23 1656 --                    Lab Results   Component Value Date    GLUCOSE 101 08/22/2023    CALCIUM 9.0 08/22/2023    NA 138 08/22/2023    K 3.9 08/22/2023    CO2 27 08/22/2023    CL 105 08/22/2023    BUN 14 08/22/2023    CREATININE 1.06 08/22/2023     Lab Results   Component Value Date    WBC 7.4 08/22/2023    HGB 13.1 08/22/2023    HCT 41.6 08/22/2023    MCV 92.7 08/22/2023    PLT 400 08/22/2023     Lab Results   Component Value Date    ALT 20 08/21/2023    AST 30 08/21/2023    ALKPHOS 122 08/21/2023    BILITOT 0.4 08/21/2023      Lab Results   Component Value Date    INR 1.00 08/12/2023            Assessment/Plan   Principal Problem:    Syncope and collapse  Active Problems:    Syncope, unspecified syncope type    Molly Jensen is a 73 y.o. female history of   hypertension, neuropathy and recurrent syncope who presents for evaluation of increasing frequency of falls as well as syncope last night with right sided rib pain.     # Syncope and fall  # Ninth and 10th rib fractures secondary to fall  # Right shoulder pain secondary to fall  # Age-related physical debility  Syncopal episode is most likely vasovagal related to micturition  As per daughter patient has been unsteady on her feet and has had multiple  falls without loss of consciousness or lightheadedness  X-ray does not demonstrate any right shoulder fracture  PT/OT evaluation: Recommends rehab  Case management consulted for placement  Pain control as needed     # Hyperglycemia  Daughter confirmed patient does not have a history of diabetes  HbA1c is 5.8     DVT prophylaxis  Full code  MedSurg             Dustin Folks, MD

## 2023-08-22 NOTE — Nursing Note (Signed)
Patient A&O x4, pleasant. O2SAT 91-93%. Placed on 2lnc per MD order 95%. Patient needs encouragement to take deep breaths r/t pain/discomfort. Utilizing pain medication per MD order with good effect. Safety precautions maintained, call light within reach. Cont plan of care.

## 2023-08-22 NOTE — Care Plan (Addendum)
Goal Outcome Evaluation:  Plan of Care Reviewed With: patient    Assumed care 0700-1900. A+OX4. VSS. Endorses right rib pain and shoulder pain; prn analgesic modified to p.o 5 mg oxy and given with some effect. Attending made aware, medication modified to 7.5mg  and given w/+ effect. Lidocaine patch applied to right shoulder. Seen by PT this shift; recommending rehab. 1 assist oob to br w/rw. MRSA swab pending. Safety and comfort maintained.

## 2023-08-22 NOTE — Consults (Signed)
Occupational Therapy Evaluation     Patient Name: Molly Jensen  MRN: [id number]  DOB: 03/09/1951  Today's Date: 08/22/2023    Subjective   HPI/Hospital Course: Molly Jensen is a 73 y.o. female history of  diabetes, hypertension, neuropathy and recurrent syncope who presents for evaluation of increasing frequency of falls as well as syncope last night with right sided rib pain.  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  She was recently diagnosed with influenza in December and was admitted last wee. Xray ribs show 9th and 10th rib fx 2/2 fall. OT consult received, RN clear pt for OT eval.     Patient Active Problem List   Diagnosis    Primary osteoarthritis of right knee    Primary osteoarthritis of left knee    Influenza B    Syncope and collapse    Syncope, unspecified syncope type       Past Medical History:   Diagnosis Date    Degenerative joint disease     Hypertension ()        Past Surgical History:   Procedure Laterality Date    CHOLECYSTECTOMY      CTA CHEST W AND WO CONTRAST  08/12/2023    CTA CHEST W AND WO CONTRAST 08/12/2023 [facility] CT    HYSTERECTOMY         Objective   General Information  General Info  OT Received On: 08/22/23  Following Therapy Session:: Call bell within reach, Patient in Chair, Chair Alarm Activated, Nursing Staff Aware of Patient Location  Plan of Care Reviewed With:: Patient, RN/Charge Nurse, PT  Recommended mobility with nursing staff: 1 A to bathroom  Eval Information  Type of Evaluation: Initial Evaluation    Precautions  Precautions  Other Precautions: Fall Risk    Home Living  Was Patient Admitted from STR?: No  Lives With: Alone  Type of Home: House  Home Layout: Two Level  Number of Stairs Within Home: 1 FOS  Bathroom Shower/Tub: Medical sales representative: Standard  Home Equipment: Agricultural consultant, Single [employer]    Prior Level of Function  Information Provided By: Patient  Independent at Baseline: All Museum/gallery exhibitions officer, All Community  Mobility, All ADL's, All ADL's/IADL's, Driving  History of Recent Falls: Yes (leading to this admission)  Comments: Pt reports she is very independent at baseline.Amb no AD. Ind with all ADLS/IADLS and drives. Had been active with outpatient PT    Pain  Pain Assessment  Pain Assessment: 0-10  Pain Score: 8  Pain Type: Acute pain  Pain Location: Rib cage  Pain Orientation: Right    Vital Signs  Vital Signs  Pulse: 76  BP: 123/79  Oxygen Therapy  SpO2: 95 %  Oxygen Therapy: None (Room air)    Cognition  Cognition  Overall Cognitive Status: Within Functional Limits    Integumentary  Integumentary  Integumentary: IV  LDA Integrity: Intact Pre and Post Therapy    ROM/Strength  PROM Right Upper Extremity  Overall RUE PROM: WFL  PROM Left Upper Extremity  Overall LUE PROM: WFL  PROM Right Lower Extremity  Overall RLE PROM: WFL  PROM Left Lower Extremity  Overall LLE PROM: WFL  Strength Right Upper Extremity  RUE Overall Strength: WFL  Strength Left Upper Extremity  LUE Overall Strength: WFL  Strength Right Lower Extremity  RLE Overall Strength: WFL  Strength Left Lower Extremity  LLE Overall Strength: [facility]  Neuromuscular Assessment  Neuromuscular Assessment  Auditory: WFL  Vestibular: WFL (denies dizziness)  Coordination: WFL  Sensation: WFL  Vision: WFL         Functional Mobility Assessment  Bed Position: HOB elevated, Use of bedrail  Supine to Sit: Minimal Assist, X1 Assist, Cues for Sequencing  Scooting: Supervision (to bring feet to floor)  Bed Mobility Comments: incr time and effort to complete bed mobility 2/2 incr pain in R side ribcage. Slow movements.    Sit to Stand: Minimal Assist, X1 Assist, Cues for Hand Placement  Stand to Sit: Minimal Assist, X1 Assist, Cues for Hand Placement  Transfers Comments: slow to rise, incr time and effort to complete functional transfers, req min A . Limited by pain. Cues for hand placement.    Distance: Household distances  Assistance Level: Minimal Assist, X1 Assist,  Cues for Sequencing  Assistive Device: None (HHA)  Comments: Pt amb into hallway and back, very slow cadence, relies on HHA for support. No dizziness reported.    Sitting Balance - Static: Good EOB  Sitting Balance - Dynamic: Good EOb  Standing Balance - Static: Fair no AD  Standing Balance - Dynamic: Fair no AD  Descriptive Comments: Pt sitting EOB with good balance however reports incr spasms in R side rib area with functional transitions    ADL's  Feeding: Independent, Setup (breakfast)  Lower Body Dressing: Maximum Assist (managing LB clothing/socks)  ADL Comments: Pt req incr assistance with ADLS 2/2 pain in ribs with functional movement.  AMPAC - (Daily Activity Inpatient Short Form) How much help from another person do you currently need?  Putting on and taking off regular lower body clothing?: 2- A Lot  Bathing (including washing, rinsing, drying)?: 2- A Lot  Toileting, which includes using toilet,bedpan or urinal?: 2- A Lot  Putting on and taking off regular upper body clothing?: 2- A Lot  Taking care of personal grooming such as brushing teeth?: 3 - A Little  Eating meals?: 3 - A Little  Raw Score: 14      Education  Education  Education Provided: Role of OT/Plan of Care  Education Provided to: Patient  Teaching Method: Discussion, Demonstration  Learning Evaluation: Verbalizes understanding, Needs practice, Needs reinforcement/further teaching    Assessment   Molly Jensen is a 73 y.o. female history of  diabetes, hypertension, neuropathy and recurrent syncope who presents for evaluation of increasing frequency of falls as well as syncope last night with right sided rib pain.  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  She was recently diagnosed with influenza in December and was admitted last wee. Xray ribs show 9th and 10th rib fx 2/2 fall. Pt seen for OT eval and presents well below her baseline of independent with ADLS/IADLS and functional mobility. Pt with recent  frequent falls 2/2 covid/ flu and generalized weakness/decr activity tolerance. Pts fall lead to R side rib fx. Pt now having difficulty ambulating short distances, completing her ADLS independently. She lives alone and family works during the day, will not have assistance at home. At this time, recommend discharge to acute rehab to maximize functional independence with ADLs/IADLS adn functional mobility prior to returning home. Pt is motivated to return to her baseline level of function. OT will cont to follow while pt remains in hospital.    Patient presents with ADL/Self care deficit, Functional Mobility deficit, IADL deficit, Impaired activity tolerance, Strength deficit, Balance deficit, Impaired safety awareness, Pain  Prognosis: Excellent       Goals   Short Term Goals  Date Established/Amended: 08/22/23  Goal timeframe: 1 week  Goal 1: Pt will participate in toilet transfer/routine assessment  Goal 2: Pt will complete LB ADLs with mod I using LHAE PRN  Goal 3: Pt will amb with supervision using LRAD household distances  Goal 4: Pt will complete standing grooming task with supervision    Long Term Goals              Plan   Plan: Continued skilled Inpatient OT services   Treatment Interventions: ADLs/Self Care Training, Patient/Caregiver Education, Functional Mobility, Bed Mobility, Teacher, early years/pre, Assistive Device Training, Engineer, civil (consulting), Interior and spatial designer, Insurance claims handler, Naval architect, Energy Conservation  OT Frequency and Duration: 2-3 x/week until Discharge  Treatment:      Recommendations  Anticipate Discharge to: Acute Rehab  Recommended mobility with nursing staff: 1 A to bathroom    Roetta Sessions, OT  [place] lic # [id number]

## 2023-08-22 NOTE — Consults (Signed)
Physical Therapy Evaluation    Patient Name: Molly Jensen  MRN: [id number]  DOB: 14-Dec-1950  Evaluation Date: 08/22/2023    Subjective   HPI/Hospital Course:   Molly Jensen is a 73 y.o. female w/ PMHx significant for diabetes, HTN, neuropathy, and recurrent syncope who presents w/ increasing falls, as well as syncope episode w/ R sided rib pain, found to have R 9th and 10th rib fractures. Pt reports she was dx w/ COVID in October, then long haul COVID. Recently dx w/ the flu in December and was admitted last week. PT consulted for mobility assessment and dc planning, cleared for eval by RN.     Patient Active Problem List   Diagnosis    Primary osteoarthritis of right knee    Primary osteoarthritis of left knee    Influenza B    Syncope and collapse    Syncope, unspecified syncope type       Past Medical History:   Diagnosis Date    Degenerative joint disease     Hypertension ()        Past Surgical History:   Procedure Laterality Date    CHOLECYSTECTOMY      CTA CHEST W AND WO CONTRAST  08/12/2023    CTA CHEST W AND WO CONTRAST 08/12/2023 [facility] CT    HYSTERECTOMY         Objective     General Information  General Info  PT Received On: 08/22/23  Following Therapy Session:: Call bell within reach, Patient in Chair, Chair Alarm Activated, Nursing Staff Aware of Patient Location  Plan of Care Reviewed With:: Patient, RN/Charge Nurse, OT  Recommended mobility with nursing staff: 1 A to bathroom  Eval Information  Type of Evaluation: Initial Evaluation    Precautions  Other Precautions: Fall Risk    Home Living  Was Patient Admitted from STR?: No  Lives With: Alone  Type of Home: House  Home Layout: Two Level, Performs ADL's on One Level, Able to Live on One Level with Bedroom/Bathroom  Number of Stairs to Enter Home: 1  Number of Stairs Within Home: 1 FOS  Bathroom Shower/Tub: Medical sales representative: Standard  Home Equipment: Agricultural consultant, Single [employer]  Comments: Per pt no AD at baseline, bedroom is  upstairs; however, family bought a bed for her to be able to sleep downstairs    Prior Level of Function  Information Provided By: Patient  Independent at Baseline: All Museum/gallery exhibitions officer, All Community Mobility, All ADL's, All ADL's/IADL's, Driving, Shopping, Without Device  History of Recent Falls: Yes (reason for admission)  Work Status: Retired (Pt worked as a Naval architect)  Comments: 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 and active w/ OP PT    Pain  Pain Assessment: 0-10  Pain Score: 8  Pain Type: Acute pain  Pain Location: Rib cage  Pain Orientation: Right  Pain Management Interventions: care clustered, position adjusted, pillow support provided, premedicated for activity       Vital Signs  Vital Signs  Pulse: 76  BP: 123/79  Oxygen Therapy  SpO2: 95 %  Oxygen Therapy: None (Room air)    Cognition  Overall Cognitive Status: Within Functional Limits    Integumentary  Integumentary: PIV, purewick  LDA Integrity: Intact Pre and Post Therapy    ROM/Strength  PROM Right Upper Extremity  Overall RUE PROM: WFL  PROM Left Upper Extremity  Overall LUE PROM: WFL  PROM Right Lower Extremity  Overall RLE PROM: [facility]  PROM Left Lower Extremity  Overall LLE PROM: WFL  Strength Right Upper Extremity  RUE Overall Strength: WFL  Strength Left Upper Extremity  LUE Overall Strength: WFL  Strength Right Lower Extremity  RLE Overall Strength: WFL  Strength Left Lower Extremity  LLE Overall Strength: WFL    Neuromuscular Assessment  Auditory: WFL         Mobility Assessment  Bed Position: HOB elevated, Use of bedrail  Supine to Sit: Minimal Assist, X1 Assist, Cues for Sequencing  Scooting: Supervision  Bed Mobility Comments: increased time and effort for bed mobility 2/2 pain, minA at trunk for supine>sit, movements slow and guarded    Sit to Stand: Minimal Assist, X1 Assist, Cues for Hand Placement  Stand to Sit: Minimal Assist, X1 Assist, Cues for Hand Placement  Assistive Device: None  Transfers Comments: Pt performs transitions  slowly, guarded posture, denied dizziness/lightheadedness upon standing    Distance (Feet): 59ft x2  Assistance Level: Minimal Assist  Assistive Device: Other (Comment) (HHA)  Gait Characteristics: Pt amb w/ slow, guarded gait, decreased cadence, step length, and foot clearance, no overt instances of LOB, minA and HHA  Ambulation Comments: limited due to pain              [employer] Balance Scale: Yes  KU Sitting Balance Scale: 3+ - Independently sits without upper extremity support for 30 seconds or greater.  KU Standing Balance Scale: 1+ - Supports self with upper extremities but requires therapist assistance. Patient performs greater than 50% of effort (minimal assist).    AMPAC - (Basic Mobility Inpatient Short Form) How much help from another person do you currently need?  Turning from your back to your side while in a flat bed without using bedrails?: 3 - A little  Moving from lying on your back to sitting on the side of a flat bed without using bedrails?: 3 - A little  Moving to and from a bed to a chair (including a wheelchair)?: 3 - A little  Standing up from a chair using your arms (e.g., wheelchair, or bedside chair)?: 3 - A little  To walk in hospital room?: 3 - A little  Climbing 3-5 steps with a railing?: 2 - A Lot  Raw Score: 17         Education  Education Provided: Role of PT/Plan of Care, Discharge Planning, Adaptive Equipment/Assistive Device, Activities of Daily Living, Development worker, community, Safety Awareness/Fall Risk, Estate manager/land agent, Pacing  Education Provided to: Patient  Teaching Method: Discussion, Demonstration  Barriers to learning: Acuity of illness  Learning Evaluation: Verbalizes understanding, Needs practice, Needs reinforcement/further teaching  Comments: education re: bracing when coughing    Assessment   Molly Jensen is a 73 y.o. yo female who presents s/p syncope episode resulting in R rib fractures. Pt able to mobilize w/ therapy and complete bed mobility, transfers, and  ambulation. Pt limited due to pain at this time. Pt demonstrates good carryover of education provided throughout session. Pt currently functioning below reported baseline w/ impairments indicated below. Pt reports limited assistance available at home. Currently recommending dc to acute rehab once medically cleared as pt will benefit from continued skilled therapy to address multidisciplinary needs, as well as, maximize functional capacity, safety, and independence. Anticipate pt will tolerate >/= 3 hours of therapy daily x 5 days per week. PT will continue to follow while pt in house for skilled therapy services.    Patient presents with Impaired gait, Functional Mobility deficit, Impaired activity tolerance,  Pain       Prognosis: Good       Goals   Short Term Goals  Date Established/Amended: 08/22/23  Goal timeframe: 2 wks  Bed mobility goal: pt will perform bed mobility independently  Transfers goal: Pt will perform all transfers independently  Ambulation goal: Pt will ambulate >/= 174ft independently  Stairs goal: Pt will negotiate FOS indepedently         Plan   Plan: Continued Skilled Inpatient PT Services   Treatment Interventions: Therapeutic exercise, Assistive device training, Functional mobility training, Gait training, Stair training, Patient/family education, Neuromuscular Re-education/balance, Energy conservation  PT Frequency and Duration: 3-5 x/week until Discharge  Treatment:      Recommendations  Anticipate Discharge to: Acute Rehab  Recommended mobility with nursing staff: 1 A to bathroom  Ronni Rumble, PT  [place] lic # [id number]

## 2023-08-23 LAB — MRSA BY PCR: MRSA: NOT DETECTED

## 2023-08-23 MED ORDER — polyethylene glycol (Glycolax) packet 17 g
17 | Freq: Every day | ORAL | Status: DC
Start: 2023-08-23 — End: 2023-08-26
  Administered 2023-08-23 – 2023-08-25 (×2): 17 g via ORAL

## 2023-08-23 MED ORDER — oxyCODONE (Roxicodone) immediate release tablet 5 mg
5 | Freq: Four times a day (QID) | ORAL | Status: DC | PRN
Start: 2023-08-23 — End: 2023-08-24
  Administered 2023-08-24 (×3): 5 mg via ORAL

## 2023-08-23 MED FILL — ACETAMINOPHEN 325 MG TABLET: 325 325 mg | ORAL | Qty: 2

## 2023-08-23 MED FILL — ONDANSETRON HCL (PF) 4 MG/2 ML INJECTION SOLUTION: 4 4 mg/2 mL | INTRAMUSCULAR | Qty: 2

## 2023-08-23 MED FILL — GABAPENTIN 100 MG CAPSULE: 100 100 mg | ORAL | Qty: 1

## 2023-08-23 MED FILL — LIDOCAINE 4 % TOPICAL PATCH: 4 4 % | TOPICAL | Qty: 1

## 2023-08-23 MED FILL — ENOXAPARIN 40 MG/0.4 ML SUBCUTANEOUS SYRINGE: 40 40 mg/0.4 mL | SUBCUTANEOUS | Qty: 0.4

## 2023-08-23 MED FILL — OXYCODONE 5 MG TABLET: 5 5 mg | ORAL | Qty: 2

## 2023-08-23 MED FILL — POLYETHYLENE GLYCOL 3350 17 GRAM ORAL POWDER PACKET: 17 17 gram | ORAL | Qty: 1

## 2023-08-23 MED FILL — CITALOPRAM 20 MG TABLET: 20 20 mg | ORAL | Qty: 1

## 2023-08-23 NOTE — Care Plan (Signed)
Goal Outcome Evaluation:  Plan of Care Reviewed With: patient  Progress: no change  Outcome Evaluation: Patient alert and orientedx4, VSS. patient calm and cooperative with all care provided. Cont with 5/10 pain to R shoulder and rib area, utilizing prn tylenol with minimal effect. PT cont to rec rehab. Patient requesting a stronger prn medication, MD made aware, awaiting orders. Will cont with current plan of care.

## 2023-08-23 NOTE — Progress Notes (Signed)
RN received a call from nursing home about sputum culture which showed Klebsiella pneumonia.    Patient is on 2 L of oxygen saturating 94%, CT chest was negative for pneumonia.  No leukocytosis no fever.  At this time no indication to treated.

## 2023-08-23 NOTE — Progress Notes (Signed)
Physical Therapy Treatment  Progress Note    Patient Name: Molly Jensen  MRN: [id number]  DOB: Nov 28, 1950  Today's Date: 08/23/2023    Subjective      Pt greeted in bed, agreeable to PT at this time.    Objective     General Information  General Info  PT Received On: 08/23/23  Following Therapy Session:: Call bell within reach, Patient in Chair, Chair Alarm Activated, Nursing Staff Aware of Patient Location  Plan of Care Reviewed With:: Patient, RN/Charge Nurse, OT  Recommended mobility with nursing staff: 1 A to bathroom    Precautions  Precautions  Other Precautions: Fall Risk    Pain  Pain Assessment  Pain Assessment: 0-10  Pain Score: 5 - Moderate pain  Pain Type: Acute pain  Pain Location: Rib cage  Pain Orientation: Right  Pain Management Interventions: care clustered, position adjusted, premedicated for activity          Vital Signs  Oxygen Therapy  Oxygen Therapy: None (Room air)    Cognition  Overall Cognitive Status: Within Functional Limits  Orientation Level: Oriented X4    Integumentary  Integumentary  Integumentary: PIV  LDA Integrity: Intact Pre and Post Therapy      Mobility Assessment  Bed Position: HOB elevated, Use of bedrail  Supine to Sit: Minimal Assist, X1 Assist, Cues for Sequencing  Scooting: Supervision  Bed Mobility Comments: VCs and education re: supine>sit transfers, minA at BLEs and trunk, increased time and effort required, performed w/ slow cautious movements    Sit to Stand: Advertising account executive, X1 Assist  Stand to Sit: [employer], X1 Assist  Assistive Device: Agricultural consultant  Transfers Comments: increased time to perform transfers, good carryover of cues for hand plaacement and sequencing, able to perform multiple transfers throughout session    Distance (Feet): 43ft x3  Assistance Level: Engineer, manufacturing systems Device: Tree surgeon Characteristics: Cont to amb w/ slow,  guarded gait, improved balance w/ RW, CGA throughout              [employer] Scale:  Yes  KU Sitting Balance Scale: 4 - Independently moves and returns to center of gravity 1-2 inches in 1 plane.  KU Standing Balance Scale: 2 - Independently supports self with both upper extremities.    AMPAC - (Basic Mobility Inpatient Short Form) How much help from another person do you currently need?  Turning from your back to your side while in a flat bed without using bedrails?: 3 - A little  Moving from lying on your back to sitting on the side of a flat bed without using bedrails?: 3 - A little  Moving to and from a bed to a chair (including a wheelchair)?: 3 - A little  Standing up from a chair using your arms (e.g., wheelchair, or bedside chair)?: 3 - A little  To walk in hospital room?: 3 - A little  Climbing 3-5 steps with a railing?: 2 - A Lot  Raw Score: Paramedic Provided: Discharge Planning, Customer service manager, Development worker, community, Safety Awareness/Fall Risk, Estate manager/land agent, Pacing  Education Provided to: Patient  Teaching Method: Discussion, Demonstration  Barriers to learning: Acuity of illness  Learning Evaluation: Verbalizes understanding, Needs practice, Needs reinforcement/further teaching    Assessment   Pt tolerated PT session well, able to continue to make progress toward her goals. Trialed ambulation w/ RW this date and pt  demonstrated improved balance. Continued to demonstrate slow, guarded movement, 2/2 pain. At this time continue to recommend dc to acute rehab once medically cleared as pt will benefit from continued skilled therapy to address multidisciplinary needs, as well as, maximize functional capacity, safety, and independence. Anticipate pt will tolerate >/= 3 hours of therapy daily x 5 days per week.     Goals  Short Term Goals  Date Established/Amended: 08/22/23  Goal timeframe: 2 wks  Bed mobility goal: pt will perform bed mobility independently  Transfers goal: Pt will perform all transfers independently  Ambulation goal: Pt will  ambulate >/= 131ft independently  Stairs goal: Pt will negotiate FOS indepedently         Plan   Plan: Continued Skilled Inpatient PT Services   Treatment Interventions: Therapeutic exercise, Assistive device training, Functional mobility training, Gait training, Stair training, Patient/family education, Neuromuscular Re-education/balance, Energy conservation  PT Frequency and Duration: 3-5 x/week until Discharge    Recommendations  Anticipate Discharge to: Acute Rehab  Recommended mobility with nursing staff: 1 A to bathroom    Ronni Rumble, PT  [place] lic # [id number]

## 2023-08-23 NOTE — Progress Notes (Signed)
Occupational Therapy Treatment       Patient Name: Molly Jensen  MRN: [id number]  Treatment Date: 08/23/2023    Subjective   HPI/Hospital Course: Pt agreeable to OT.    Objective   General Information  General Info  OT Received On: 08/23/23  Following Therapy Session:: Call bell within reach, Patient in Chair, Chair Alarm Activated, Nursing Staff Aware of Patient Location  Plan of Care Reviewed With:: Patient, RN/Charge Nurse, PT  Recommended mobility with nursing staff: 1 A to bathroom    Precautions  Precautions  Other Precautions: Fall Risk    Pain  Pain Assessment  Pain Assessment: 0-10  Pain Score: 5 - Moderate pain  Pain Type: Acute pain  Pain Location: Rib cage  Pain Orientation: Right          Vital Signs     Vitals:    08/23/23 0758   BP: 97/60   Pulse: 78   Resp: 18   Temp: 36.3 C (97.3 F)   SpO2: (!) 94%           Cognition  Cognition  Overall Cognitive Status: Within Functional Limits    Integumentary  Integumentary  Integumentary: PIV  LDA Integrity: Intact Pre and Post Therapy     ROM/Strength  PROM Right Upper Extremity  Overall RUE PROM: WFL  PROM Left Upper Extremity  Overall LUE PROM: WFL     Strength Right Upper Extremity  RUE Overall Strength: WFL  Strength Left Upper Extremity  LUE Overall Strength: WFL  RUE Overall Strength: WFL  LUE Overall Strength: WFL    Neuromuscular Assessment/Treatment  Auditory: WFL  Vestibular: WFL  Coordination: WFL  Sensation: WFL  Vision: WFL       Functional Mobility  Bed Mobility  Bed Position: HOB elevated, Use of bedrail  Supine to Sit: Minimal Assist, X1 Assist, Cues for Sequencing  Scooting: Supervision  Bed Mobility Comments: pt req cues and educ re: supine > sit transfer. incr time to complete task, min A for LE's and for trunk to complete full sit EOB. Incr pain with transitional movements on R side of ribs. Supervision to bring feet to floor.  Transfers  Sit to Stand: X1 Assist, Cues for Hand Placement, Contact Guard  Stand to Sit: Contact Guard, X1  Assist, Cues for Science writer, Cues for Sequencing  Toilet Transfer: Contact Guard, X1 Assist, Cues for Hand Placement, Cues for Sequencing  Transfers Comments: Pt completed sit <> stand with cues for hand placement and CTG A. Much better stability/balance in standign with RW. Pt demos CHT toilet transfer using GB on L side to assist with lowering up and down. Pt returned to sit in the chair, cues for RW managemnet/safety and CTG A to sit in chair with improved eccentric control on descent.  Functional Mobility  Distance: Household distances  Assistance Level: Engineer, manufacturing systems Device: Agricultural consultant  Comments: Pt amb incr distances using RW. Much improved with funct mobility cadence/ distance with support from RW. Pt reports tightness in ribs and pain, but not as bad as when she has spasms in her R side. Pt returned to sit in a chair. CTG A with funct mob and cues for sequencing/turning with RW.  Balance  Sitting Balance - Static: Good EOB  Sitting Balance - Dynamic: Good EOb  Standing Balance - Static: Fair + with RW  Standing Balance - Dynamic: Fair + with RW  Descriptive Comments: Much improved standing balance with use of RW.  ADL's  Feeding: Independent, Setup  Lower Body Dressing: Maximum Assist  ADL Comments: pt cont to req incr assist with all ADLS, difficulty with LE ADLs 2/2 bending and incr pain. Req max A with LB ADLS  IADL Comments: Pt req incr assist with all IADLS 2/2 pain    AMPAC - (Daily Activity Inpatient Short Form) How much help from another person do you currently need?  Putting on and taking off regular lower body clothing?: 2- A Lot  Bathing (including washing, rinsing, drying)?: 2- A Lot  Toileting, which includes using toilet,bedpan or urinal?: 2- A Lot  Putting on and taking off regular upper body clothing?: 2- A Lot  Taking care of personal grooming such as brushing teeth?: 3 - A Little  Eating meals?: 3 - A Little  Raw Score: 14      Education  Education  Education Provided:  Role of OT/Plan of Care  Education Provided to: Patient  Teaching Method: Discussion, Demonstration  Learning Evaluation: Verbalizes understanding, Needs practice, Needs reinforcement/further teaching    Assessment   Pt seen for OT treatment on this date. Pt received supine in bed and req min A to complete bed mobility 2/2 incr pain. Pt req incr time / effort to complete sit <> stand transfer, cues for hand placement. Improved funct mobility this date using RW, amb into hallway and back. Req CTG A with use of GB for toilet transfer. Cues for hand placement prior to sitting in a chair with improved eccentric control. Pt cont to be most limited by incr pain in R ribs with functional activities. Pt lives alone with no assistance from others and currently is req incr assist with all ADLs/IADLS and CTG A with RW to amb short distances. Cont to strongly recommend acute rehab. OT will cont to follow.     Short Term Goals  Date Established/Amended: 08/22/23  Goal timeframe: 1 week  Goal 1: Pt will participate in toilet transfer/routine assessment  Goal 2: Pt will complete LB ADLs with mod I using LHAE PRN  Goal 3: Pt will amb with supervision using LRAD household distances  Goal 4: Pt will complete standing grooming task with supervision    Long Term Goals       Plan   Plan: Continued skilled Inpatient OT services   Treatment Interventions: ADLs/Self Care Training, Patient/Caregiver Education, Functional Mobility, Bed Mobility, Teacher, early years/pre, Assistive Device Training, Engineer, civil (consulting), Interior and spatial designer, Insurance claims handler, Naval architect, Energy Conservation  OT Frequency and Duration: 2-3 x/week until Discharge    Recommendations  Anticipate Discharge to: Acute Rehab  Recommended mobility with nursing staff: 1 A to bathroom    Roetta Sessions, OT  [place] lic # [id number]

## 2023-08-23 NOTE — Progress Notes (Signed)
08/21/23 1504   Case Management Initial Assessment   Patient Discharged Before Able to Interview/Assess No   Source of Information Family   Type of Residence Multilevel Home   Lives With Alone   Discharge Services Anticipated at this Time Yes   Expected Discharge Needs VNA;SNF   Patient Information   Interpreter Needs None   Home Caregiver Family   Accompanied by Family   Support Systems Children/Family   Need PCP on Discharge No   Current Patient Responsibilities Personal Care;Housekeeping;Meal Prep;Shopping;Driving   PTA Functional Status   Is patient prescribed an anti-coagulant? No   Current Functional Status   Bathing Independent   Toileting Independent   Walking in Home Independent   Assistive Device Used None   Community Mobility Independent     Case Management Progress Note:   Spoke to the daughter Molly Jensen for initial screen. Pt lives alone. I iwith Adls and IADLS PTA. Was recently discharged home independently on 12/24. Tested +flu 12/23. Daughter at her bedside. Daughter lives close to the daughter. Came in with multiple falls at home. Explained to the daughter that she will need a 3 midnight inpatient stay in order to qualify for rehab. Will have PT work with her. Daughter verbalized understanding. COC to follow   [name], RN  08/21/2023  3:09 PM      Case Management Progress Note: Chart reviewed and spoke with pt over the phone as TW at main campus. Pt aware PT/OT rec acute rehab. Reviewed local acute rehabs in the area. She chooses [facility] as first Tour manager as second. Referrals placed and pending  [name], RN  08/23/23  7:44 AM

## 2023-08-23 NOTE — Progress Notes (Signed)
Spiritual Care   Name: Akhia Carras  MRN: [id number]  Visit Date: 08/23/2023    Visit Informaton  Clinical Encounter Type  Encounter Type: Routine  Visited With: Patient  Reason For Visit: Chaplain rounding    Observations  Patient was resting quietly.    Assessment  Expressed Feeling of: Acceptance  Hope Level: Good  Support Resources Reported: Family    Spiritual Distress: None         Completed Interventions  Interventions  Interventions: Prayer, Provided presence/accompaniment    Outcome  Awareness of Spiritual Care Services     Plan       Time Spent   Time Spent: 10    Services       [employer]

## 2023-08-23 NOTE — Care Plan (Signed)
Goal Outcome Evaluation:  Plan of Care Reviewed With: patient  Progress: no change  Outcome Evaluation: Patient is A&OX4. Pleasant/ cooperative upon approach. C/O R sided pain, generalized discomfort 9/10. Medicated with Oxycodone 7.5 mg PO with PRN Tylenol 650 mg PO, with good effect. No respiratory distress/ SOB. Safety maintained. Will continue with POC.

## 2023-08-23 NOTE — Progress Notes (Signed)
INTERNAL MEDICINE PROGRESS NOTE      Name: Molly Jensen  MRN: [id number]  Hospital Day: Hospital Day: 3    SUBJECTIVE   Patient seen at bedside.  Has not had a bowel movement for a few days.  She was getting oxycodone which has been discontinued.    OBJECTIVE   Current Vital Signs with 24 Hour Ranges:  Visit Vitals  BP 97/60 (BP Location: Right arm, Patient Position: Sitting)   Pulse 78   Temp 36.3 C (97.3 F) (Oral)   Resp 18      PHYSICAL EXAMINATION  General: Alert and oriented x 3 elderly female,  Eye: Pupils are equal  Respiratory: Respirations are non labored, Lungs are clear to auscultation  Cardiovascular: Normal rate, Regular rhythm, s1 s2 appreciated  Gastrointestinal: Soft, Non-tender, Non-distended.  Genitourinary: No costovertebral angle tenderness  Neurologic:  patient is awake, alert moving all 4 extremities.    MEDICATIONS    citalopram, 20 mg, oral, Daily  enoxaparin, 40 mg, subcutaneous, Daily  gabapentin, 100 mg, oral, Nightly  lidocaine, 1 patch, topical (top), Daily  polyethylene glycol, 17 g, oral, Daily         PRN medications: acetaminophen **OR** acetaminophen **OR** acetaminophen, ondansetron ODT **OR** ondansetron     LABORATORY     RECENT LABS  Results from last 7 days   Lab Units 08/22/23  0622 08/21/23  0922   HEMATOCRIT % 41.6 40.2   HEMOGLOBIN g/dL 54.0 98.1   WBC AUTO K/uL 7.4 8.1   PLATELETS AUTO K/uL 400 384      Results from last 7 days   Lab Units 08/22/23  0622 08/21/23  0922   GLUCOSE mg/dL 191 478*   CALCIUM mg/dL 9.0 9.2   SODIUM mmol/L 138 137   POTASSIUM mmol/L 3.9 4.2   CO2 mmol/L 27 27   CHLORIDE mmol/L 105 106   BUN mg/dL 14 12   CREATININE mg/dL 2.95 6.21           Results from last 7 days   Lab Units 08/21/23  0922   ALT U/L 20   AST U/L 30   ALK PHOS U/L 122   BILIRUBIN TOTAL mg/dL 0.4          IMAGING : REVIEWED.    ASSESSMENT & PLAN   [name] is a 73 y.o. female history of   hypertension, neuropathy and recurrent syncope who presents for evaluation of increasing  frequency of falls as well as syncope last night with right sided rib pain.     # Syncope and fall resulting in 9th and 10th rib fractures  # Right shoulder pain secondary to fall  # Age-related physical debility  -Syncopal episode is most likely vasovagal related to micturition.As per daughter patient has been unsteady on her feet and has had multiple falls without loss of consciousness or lightheadedness  -X-ray does not demonstrate any right shoulder fracture  -Patient seen by PT and OT who recommended rehab  -Incentive spirometry    -COC following for placement. Pain control as needed     # Hyperglycemia  -Daughter confirmed patient does not have a history of diabetes  -HbA1c is 5.8    # Constipation  -MiraLAX ordered.  Discontinued oxycodone.  Tylenol only for pain     DVT prophylaxis  Full code  MedSurg  Dispo: To rehab when bed is available    Disclaimer: This note was generated with [facility] Medical voice recognition program.  Please excuse any  errors which may have been overlooked during review.  Sometimes, these errors may affect the content or meaning of a given sentence    Heraclio Seidman MD   Hospitalist  [facility].

## 2023-08-24 LAB — CBC WITH DIFFERENTIAL
Basophils %: 0.2 %
Basophils Absolute: 0.01 10*3/uL (ref 0.00–0.22)
Eosinophils %: 1.4 %
Eosinophils Absolute: 0.09 10*3/uL (ref 0.00–0.50)
Hematocrit: 37.4 % (ref 32.0–47.0)
Hemoglobin: 11.9 g/dL (ref 11.0–16.0)
Immature Granulocytes %: 0.3 %
Immature Granulocytes Absolute: 0.02 10*3/uL (ref 0.00–0.10)
Lymphocyte %: 26.9 %
Lymphocytes Absolute: 1.67 10*3/uL (ref 0.70–4.00)
MCH: 29.7 pg (ref 26.0–34.0)
MCHC: 31.8 g/dL (ref 31.0–37.0)
MCV: 93.3 fL (ref 80.0–100.0)
MPV: 8.7 fL — ABNORMAL LOW (ref 9.1–12.4)
Monocytes %: 7.7 %
Monocytes Absolute: 0.48 10*3/uL (ref 0.36–0.77)
NRBC %: 0 % (ref 0.0–0.0)
NRBC Absolute: 0 10*3/uL (ref 0.00–2.00)
Neutrophil %: 63.5 %
Neutrophils Absolute: 3.94 10*3/uL (ref 1.50–7.95)
Platelets: 401 10*3/uL — ABNORMAL HIGH (ref 150–400)
RBC: 4.01 M/uL (ref 3.70–5.20)
RDW-CV: 15.3 % — ABNORMAL HIGH (ref 11.5–14.5)
RDW-SD: 51.9 fL — ABNORMAL HIGH (ref 35.0–51.0)
WBC: 6.2 10*3/uL (ref 4.0–11.0)

## 2023-08-24 LAB — BASIC METABOLIC PANEL
Anion Gap: 7 mmol/L (ref 3–14)
BUN: 22 mg/dL (ref 6–24)
CO2 (Bicarbonate): 26 mmol/L (ref 20–32)
Calcium: 9.1 mg/dL (ref 8.5–10.5)
Chloride: 103 mmol/L (ref 98–110)
Creatinine: 1.12 mg/dL (ref 0.55–1.30)
Glucose: 100 mg/dL (ref 70–110)
Potassium: 4.4 mmol/L (ref 3.6–5.2)
Sodium: 136 mmol/L (ref 135–146)
eGFRcr: 53 mL/min/{1.73_m2} — ABNORMAL LOW (ref >=60)

## 2023-08-24 LAB — MAGNESIUM: Magnesium: 2.2 mg/dL (ref 1.6–2.6)

## 2023-08-24 MED ORDER — levoFLOXacin (Levaquin) tablet 500 mg
250 | Freq: Every day | ORAL | Status: DC
Start: 2023-08-24 — End: 2023-08-26
  Administered 2023-08-24 – 2023-08-26 (×3): 500 mg via ORAL

## 2023-08-24 MED ORDER — oxyCODONE (Roxicodone) immediate release tablet 7.5 mg
5 | Freq: Four times a day (QID) | ORAL | Status: DC | PRN
Start: 2023-08-24 — End: 2023-08-26
  Administered 2023-08-24 – 2023-08-26 (×4): 7.5 mg via ORAL

## 2023-08-24 MED FILL — ACETAMINOPHEN 325 MG TABLET: 325 325 mg | ORAL | Qty: 2

## 2023-08-24 MED FILL — OXYCODONE 5 MG TABLET: 5 5 mg | ORAL | Qty: 1

## 2023-08-24 MED FILL — CITALOPRAM 20 MG TABLET: 20 20 mg | ORAL | Qty: 1

## 2023-08-24 MED FILL — LEVOFLOXACIN 250 MG TABLET: 250 250 mg | ORAL | Qty: 2

## 2023-08-24 MED FILL — POLYETHYLENE GLYCOL 3350 17 GRAM ORAL POWDER PACKET: 17 17 gram | ORAL | Qty: 1

## 2023-08-24 MED FILL — OXYCODONE 5 MG TABLET: 5 5 mg | ORAL | Qty: 2

## 2023-08-24 MED FILL — LIDOCAINE 4 % TOPICAL PATCH: 4 4 % | TOPICAL | Qty: 1

## 2023-08-24 MED FILL — GABAPENTIN 100 MG CAPSULE: 100 100 mg | ORAL | Qty: 1

## 2023-08-24 MED FILL — ENOXAPARIN 40 MG/0.4 ML SUBCUTANEOUS SYRINGE: 40 40 mg/0.4 mL | SUBCUTANEOUS | Qty: 0.4

## 2023-08-24 NOTE — Progress Notes (Addendum)
INTERNAL MEDICINE PROGRESS NOTE      Name: Aislin Loyer  MRN: [id number]  Hospital Day: Hospital Day: 4    SUBJECTIVE   Patient seen at bedside.  Has not had a bowel movement for a few days.  Was concerned regarding yeast infection in sputum.   OBJECTIVE   Current Vital Signs with 24 Hour Ranges:  Visit Vitals  BP 106/67 (BP Location: Left arm, Patient Position: Sitting)   Pulse 65   Temp 36.8 C (98.3 F) (Oral)   Resp 18      PHYSICAL EXAMINATION  General: Alert and oriented x 3 elderly female,  Eye: Pupils are equal  Respiratory: Respirations are non labored, Lungs are clear to auscultation, decreased breath sound   Cardiovascular: Normal rate, Regular rhythm, s1 s2 appreciated  Gastrointestinal: Soft, Non-tender, Non-distended.  Genitourinary: No costovertebral angle tenderness  Neurologic:  patient is awake, alert moving all 4 extremities.    MEDICATIONS    citalopram, 20 mg, oral, Daily  enoxaparin, 40 mg, subcutaneous, Daily  gabapentin, 100 mg, oral, Nightly  lidocaine, 1 patch, topical (top), Daily  polyethylene glycol, 17 g, oral, Daily         PRN medications: acetaminophen **OR** acetaminophen **OR** acetaminophen, ondansetron ODT **OR** ondansetron, oxyCODONE     LABORATORY     RECENT LABS  Results from last 7 days   Lab Units 08/24/23  0635 08/22/23  0622 08/21/23  0922   HEMATOCRIT % 37.4 41.6 40.2   HEMOGLOBIN g/dL 14.7 82.9 56.2   WBC AUTO K/uL 6.2 7.4 8.1   PLATELETS AUTO K/uL 401* 400 384      Results from last 7 days   Lab Units 08/24/23  0635 08/22/23  0622 08/21/23  0922   GLUCOSE mg/dL 130 865 784*   CALCIUM mg/dL 9.1 9.0 9.2   SODIUM mmol/L 136 138 137   POTASSIUM mmol/L 4.4 3.9 4.2   CO2 mmol/L 26 27 27    CHLORIDE mmol/L 103 105 106   BUN mg/dL 22 14 12    CREATININE mg/dL 6.96 2.95 2.84           Results from last 7 days   Lab Units 08/21/23  0922   ALT U/L 20   AST U/L 30   ALK PHOS U/L 122   BILIRUBIN TOTAL mg/dL 0.4          IMAGING : REVIEWED.    ASSESSMENT & PLAN   Tiwana Christeson is a 73  y.o. female history of   hypertension, neuropathy and recurrent syncope who presents for evaluation of increasing frequency of falls as well as syncope last night with right sided rib pain.     # Syncope and fall resulting in 9th and 10th rib fractures  # Right shoulder pain secondary to fall  # Age-related physical debility  -Syncopal episode is most likely vasovagal related to micturition.As per daughter patient has been unsteady on her feet and has had multiple falls without loss of consciousness or lightheadedness  -X-ray does not demonstrate any right shoulder fracture  -Patient seen by PT and OT who recommended rehab  -Incentive spirometry  - Oxycodone was stopped yesterday while the patient is complaining a lot of pain and it was restarted at a dose of 5 mg which is still not working, will go up on 7.5 mg oxycodone.  Will increase patient's bowel regimen.    -COC following for placement. Pain control as needed     # Hyperglycemia  -Daughter confirmed patient  does not have a history of diabetes  -HbA1c is 5.8    # Constipation  -MiraLAX ordered.       #Positive sputum culture with Klebsiella, pansensitive.  It was plan not to start antibiotic given patient was not have any symptom.  Today she is complaining of a lot of cough which is also worsening her rib fracture pain.  So far she is not having any fever.  Given prior history of longstanding pneumonia with a positive sputum culture I will start her on 5 days of levofloxacin, monitor qtc with daily ekg.    DVT prophylaxis: lovenox  Full code  MedSurg  Dispo: To rehab when bed is available      FACE TO FACE PATIENT COUSELLING / COORDINATING CARE MORE THAN 50% OF ENCOUNTER TIME: YES  TOTAL ENCOUNTER TIME: 35 minutes.

## 2023-08-24 NOTE — Care Plan (Signed)
Goal Outcome Evaluation:  Plan of Care Reviewed With: patient  Progress: no change  Outcome Evaluation: Patient alert and orientedx4, VSS. Patient cont with R shoulder and R rib pain, utilzing prn medication with good effect. Patient started on p.o. levaquin for pneumonia and positive sputum culture. awaiting rehab placement. Will cont with current plan of care.

## 2023-08-24 NOTE — Care Plan (Signed)
Goal Outcome Evaluation:  Plan of Care Reviewed With: patient  Progress: no change  Outcome Evaluation: Patient is A&OX4. Pleasant/ cooperative upon approach. Continue to c/o R sided back pain and generalized discomfort. Medicated with Tylenol 650 mg PRN/ PO, with mild effect. Then later patient reported the pain has gotten worse 9/10. Medicated with Oxycodone 5mg  PRN/PO with good effect. No respiratory distress/ SOB. Safety maintained. Will continue with POC.

## 2023-08-25 LAB — BASIC METABOLIC PANEL
Anion Gap: 5 mmol/L (ref 3–14)
BUN: 21 mg/dL (ref 6–24)
CO2 (Bicarbonate): 29 mmol/L (ref 20–32)
Calcium: 9.1 mg/dL (ref 8.5–10.5)
Chloride: 101 mmol/L (ref 98–110)
Creatinine: 1.13 mg/dL (ref 0.55–1.30)
Glucose: 91 mg/dL (ref 70–110)
Potassium: 4.5 mmol/L (ref 3.6–5.2)
Sodium: 135 mmol/L (ref 135–146)
eGFRcr: 52 mL/min/{1.73_m2} — ABNORMAL LOW (ref >=60)

## 2023-08-25 LAB — CBC WITH DIFFERENTIAL
Basophils %: 0.4 %
Basophils Absolute: 0.03 10*3/uL (ref 0.00–0.22)
Eosinophils %: 1.7 %
Eosinophils Absolute: 0.12 10*3/uL (ref 0.00–0.50)
Hematocrit: 38 % (ref 32.0–47.0)
Hemoglobin: 12 g/dL (ref 11.0–16.0)
Immature Granulocytes %: 0.4 %
Immature Granulocytes Absolute: 0.03 10*3/uL (ref 0.00–0.10)
Lymphocyte %: 23.1 %
Lymphocytes Absolute: 1.59 10*3/uL (ref 0.70–4.00)
MCH: 29.2 pg (ref 26.0–34.0)
MCHC: 31.6 g/dL (ref 31.0–37.0)
MCV: 92.5 fL (ref 80.0–100.0)
MPV: 8.6 fL — ABNORMAL LOW (ref 9.1–12.4)
Monocytes %: 8 %
Monocytes Absolute: 0.55 10*3/uL (ref 0.36–0.77)
NRBC %: 0 % (ref 0.0–0.0)
NRBC Absolute: 0 10*3/uL (ref 0.00–2.00)
Neutrophil %: 66.4 %
Neutrophils Absolute: 4.55 10*3/uL (ref 1.50–7.95)
Platelets: 419 10*3/uL — ABNORMAL HIGH (ref 150–400)
RBC: 4.11 M/uL (ref 3.70–5.20)
RDW-CV: 15.1 % — ABNORMAL HIGH (ref 11.5–14.5)
RDW-SD: 51.3 fL — ABNORMAL HIGH (ref 35.0–51.0)
WBC: 6.9 10*3/uL (ref 4.0–11.0)

## 2023-08-25 LAB — MAGNESIUM: Magnesium: 2.1 mg/dL (ref 1.6–2.6)

## 2023-08-25 MED ORDER — sennosides (Senokot) tablet 8.6 mg
8.6 | Freq: Two times a day (BID) | ORAL | Status: DC
Start: 2023-08-25 — End: 2023-08-26
  Administered 2023-08-25 – 2023-08-26 (×2): 8.6 via ORAL

## 2023-08-25 MED ORDER — bisacodyl (Dulcolax) suppository 10 mg
10 | Freq: Every day | RECTAL | Status: DC
Start: 2023-08-25 — End: 2023-08-26
  Administered 2023-08-25: 20:00:00 10 mg via RECTAL

## 2023-08-25 MED FILL — GABAPENTIN 100 MG CAPSULE: 100 100 mg | ORAL | Qty: 1

## 2023-08-25 MED FILL — SENNOSIDES 8.6 MG TABLET: 8.6 8.6 mg | ORAL | Qty: 1

## 2023-08-25 MED FILL — POLYETHYLENE GLYCOL 3350 17 GRAM ORAL POWDER PACKET: 17 17 gram | ORAL | Qty: 1

## 2023-08-25 MED FILL — LIDOCAINE 4 % TOPICAL PATCH: 4 4 % | TOPICAL | Qty: 1

## 2023-08-25 MED FILL — OXYCODONE 5 MG TABLET: 5 5 mg | ORAL | Qty: 2

## 2023-08-25 MED FILL — BISACODYL 10 MG RECTAL SUPPOSITORY: 10 10 mg | RECTAL | Qty: 1

## 2023-08-25 MED FILL — LEVOFLOXACIN 250 MG TABLET: 250 250 mg | ORAL | Qty: 2

## 2023-08-25 MED FILL — ENOXAPARIN 40 MG/0.4 ML SUBCUTANEOUS SYRINGE: 40 40 mg/0.4 mL | SUBCUTANEOUS | Qty: 0.4

## 2023-08-25 MED FILL — ONDANSETRON 4 MG DISINTEGRATING TABLET: 4 4 mg | ORAL | Qty: 1

## 2023-08-25 MED FILL — CITALOPRAM 20 MG TABLET: 20 20 mg | ORAL | Qty: 1

## 2023-08-25 NOTE — Care Plan (Signed)
Patient is alert, oriented, pleasant, cooperative, participating in typical hospital processes such as med passes, assessments, VS, meals, ADLs, etc. See provider note for today's plan.

## 2023-08-25 NOTE — Care Plan (Signed)
Goal Outcome Evaluation:  Plan of Care Reviewed With: patient  Progress: no change  Outcome Evaluation: Patient is A&OX4. VSS. Continue on antibiotic PO Levaquin for Pna, and she also has positive Sputum Culture. she C/O R shoulder pain and R Rib pain 8 out of 10. Medicated with Oxycodone 7.5 mg PRN/PO with good effect. Got up few times to use the bathroom to void with one assist. Encouraged the use of spirometer at the bedside. No safety concerns. Will continue with plan of care.

## 2023-08-25 NOTE — Progress Notes (Signed)
08/21/23 1504   Case Management Initial Assessment   Patient Discharged Before Able to Interview/Assess No   Source of Information Family   Type of Residence Multilevel Home   Lives With Alone   Discharge Services Anticipated at this Time Yes   Expected Discharge Needs VNA;SNF   Patient Information   Interpreter Needs None   Home Caregiver Family   Accompanied by Family   Support Systems Children/Family   Need PCP on Discharge No   Current Patient Responsibilities Personal Care;Housekeeping;Meal Prep;Shopping;Driving   PTA Functional Status   Is patient prescribed an anti-coagulant? No   Current Functional Status   Bathing Independent   Toileting Independent   Walking in Home Independent   Assistive Device Used None   Community Mobility Independent     Case Management Progress Note:   Spoke to the daughter Molly Jensen for initial screen. Pt lives alone. I iwith Adls and IADLS PTA. Was recently discharged home independently on 12/24. Tested +flu 12/23. Daughter at her bedside. Daughter lives close to the daughter. Came in with multiple falls at home. Explained to the daughter that she will need a 3 midnight inpatient stay in order to qualify for rehab. Will have PT work with her. Daughter verbalized understanding. COC to follow   [name], RN  08/21/2023  3:09 PM      Case Management Progress Note: Chart reviewed and spoke with pt over the phone as TW at main campus. Pt aware PT/OT rec acute rehab. Reviewed local acute rehabs in the area. She chooses [facility] as first Tour manager as second. Referrals placed and pending  [name], RN  08/23/23  7:44 AM    08/25/23 Case Management Progress Note: [facility] not able to accept patient, not meeting acute criteria . CM spoke with patient in person with her sister by phone about STR options. Screens sent.per preference .  [name], RN  4:22 PM

## 2023-08-25 NOTE — Progress Notes (Signed)
INTERNAL MEDICINE PROGRESS NOTE      Name: Molly Jensen  MRN: [id number]  Hospital Day: Hospital Day: 5    SUBJECTIVE   Patient seen at bedside.  Has not had a bowel movement for a few days.  Patient reports she feels much better than before.  She is wondering if she actually needs to go to rehab.  I reassured her we will get another PT evaluation in the morning.  I also spoke with the patient's PCP regarding her cough and respiratory issues.  PCP was requesting further imaging  OBJECTIVE   Current Vital Signs with 24 Hour Ranges:  Visit Vitals  BP 116/73 (BP Location: Left arm, Patient Position: Sitting)   Pulse 79   Temp 36.5 C (97.7 F) (Oral)   Resp 18      PHYSICAL EXAMINATION  General: Alert and oriented x 3 elderly female,  Eye: Pupils are equal  Respiratory: Respirations are non labored, Lungs are clear to auscultation, decreased breath sound   Cardiovascular: Normal rate, Regular rhythm, s1 s2 appreciated  Gastrointestinal: Soft, Non-tender, Non-distended.  Genitourinary: No costovertebral angle tenderness  Neurologic:  patient is awake, alert moving all 4 extremities.    MEDICATIONS    bisacodyl, 10 mg, rectal, Daily  citalopram, 20 mg, oral, Daily  enoxaparin, 40 mg, subcutaneous, Daily  gabapentin, 100 mg, oral, Nightly  levoFLOXacin, 500 mg, oral, Daily  lidocaine, 1 patch, topical (top), Daily  polyethylene glycol, 17 g, oral, Daily  sennosides, 1 tablet, oral, BID         PRN medications: acetaminophen **OR** acetaminophen **OR** acetaminophen, ondansetron ODT **OR** ondansetron, oxyCODONE     LABORATORY     RECENT LABS  Results from last 7 days   Lab Units 08/25/23  0606 08/24/23  0635 08/22/23  0622 08/21/23  0922   HEMATOCRIT % 38.0 37.4 41.6 40.2   HEMOGLOBIN g/dL 52.8 41.3 24.4 01.0   WBC AUTO K/uL 6.9 6.2 7.4 8.1   PLATELETS AUTO K/uL 419* 401* 400 384      Results from last 7 days   Lab Units 08/25/23  0606 08/24/23  0635 08/22/23  0622 08/21/23  0922   GLUCOSE mg/dL 91 272 536 644*   CALCIUM  mg/dL 9.1 9.1 9.0 9.2   SODIUM mmol/L 135 136 138 137   POTASSIUM mmol/L 4.5 4.4 3.9 4.2   CO2 mmol/L 29 26 27 27    CHLORIDE mmol/L 101 103 105 106   BUN mg/dL 21 22 14 12    CREATININE mg/dL 0.34 7.42 5.95 6.38           Results from last 7 days   Lab Units 08/21/23  0922   ALT U/L 20   AST U/L 30   ALK PHOS U/L 122   BILIRUBIN TOTAL mg/dL 0.4          IMAGING : REVIEWED.    ASSESSMENT & PLAN   Molly Jensen is a 73 y.o. female history of   hypertension, neuropathy and recurrent syncope who presents for evaluation of increasing frequency of falls as well as syncope last night with right sided rib pain.     # Syncope and fall resulting in 9th and 10th rib fractures  # Right shoulder pain secondary to fall  # Age-related physical debility  -Syncopal episode is most likely vasovagal related to micturition.As per daughter patient has been unsteady on her feet and has had multiple falls without loss of consciousness or lightheadedness  -X-ray does not demonstrate  any right shoulder fracture  -Patient seen by PT and OT who recommended rehab  -Incentive spirometry  - Oxycodone was stopped yesterday while the patient is complaining a lot of pain and it was restarted at a dose of 5 mg which is still not working, will go up on 7.5 mg oxycodone.  Will increase patient's bowel regimen.    -COC following for placement. Pain control as needed     # Hyperglycemia  -Daughter confirmed patient does not have a history of diabetes  -HbA1c is 5.8    # Constipation  -MiraLAX ordered.       #Positive sputum culture with Klebsiella, pansensitive.  It was plan not to start antibiotic given patient was not have any symptom.  Today she is complaining of a lot of cough which is also worsening her rib fracture pain.  So far she is not having any fever.  Given prior history of longstanding pneumonia with a positive sputum culture I will start her on 5 days of levofloxacin, monitor qtc with daily ekg. also discussed with the PCP over the phone.   Respiratory culture is growing Candida.  Yeast and respiratory culture is more often a colonizer.  If respiratory status does not improve with levofloxacin, I will consult pulmonology    DVT prophylaxis: lovenox  Full code  MedSurg  Dispo: To rehab when bed is available

## 2023-08-26 LAB — CBC WITH DIFFERENTIAL
Basophils %: 0.2 %
Basophils Absolute: 0.01 10*3/uL (ref 0.00–0.22)
Eosinophils %: 1.1 %
Eosinophils Absolute: 0.06 10*3/uL (ref 0.00–0.50)
Hematocrit: 38.3 % (ref 32.0–47.0)
Hemoglobin: 12 g/dL (ref 11.0–16.0)
Immature Granulocytes %: 0.2 %
Immature Granulocytes Absolute: 0.01 10*3/uL (ref 0.00–0.10)
Lymphocyte %: 22.9 %
Lymphocytes Absolute: 1.27 10*3/uL (ref 0.70–4.00)
MCH: 29 pg (ref 26.0–34.0)
MCHC: 31.3 g/dL (ref 31.0–37.0)
MCV: 92.5 fL (ref 80.0–100.0)
MPV: 8.8 fL — ABNORMAL LOW (ref 9.1–12.4)
Monocytes %: 8.1 %
Monocytes Absolute: 0.45 10*3/uL (ref 0.36–0.77)
NRBC %: 0 % (ref 0.0–0.0)
NRBC Absolute: 0 10*3/uL (ref 0.00–2.00)
Neutrophil %: 67.5 %
Neutrophils Absolute: 3.74 10*3/uL (ref 1.50–7.95)
Platelets: 418 10*3/uL — ABNORMAL HIGH (ref 150–400)
RBC: 4.14 M/uL (ref 3.70–5.20)
RDW-CV: 15.3 % — ABNORMAL HIGH (ref 11.5–14.5)
RDW-SD: 51.6 fL — ABNORMAL HIGH (ref 35.0–51.0)
WBC: 5.5 10*3/uL (ref 4.0–11.0)

## 2023-08-26 LAB — BASIC METABOLIC PANEL
Anion Gap: 3 mmol/L (ref 3–14)
BUN: 23 mg/dL (ref 6–24)
CO2 (Bicarbonate): 30 mmol/L (ref 20–32)
Calcium: 9.6 mg/dL (ref 8.5–10.5)
Chloride: 102 mmol/L (ref 98–110)
Creatinine: 1.12 mg/dL (ref 0.55–1.30)
Glucose: 89 mg/dL (ref 70–110)
Potassium: 4.4 mmol/L (ref 3.6–5.2)
Sodium: 135 mmol/L (ref 135–146)
eGFRcr: 53 mL/min/{1.73_m2} — ABNORMAL LOW (ref >=60)

## 2023-08-26 LAB — MAGNESIUM: Magnesium: 2.2 mg/dL (ref 1.6–2.6)

## 2023-08-26 MED ORDER — levoFLOXacin (Levaquin) 500 mg tablet
500 | ORAL_TABLET | Freq: Every day | ORAL | 0 refills | 30.00000 days | Status: AC
Start: 2023-08-26 — End: 2023-08-29

## 2023-08-26 MED ORDER — lidocaine 4 % patch
4 | MEDICATED_PATCH | Freq: Every day | TOPICAL | 0 refills | 30.00000 days | Status: DC
Start: 2023-08-26 — End: 2023-09-20

## 2023-08-26 MED ORDER — oxyCODONE (Roxicodone) 15 mg immediate release tablet
15 | ORAL_TABLET | Freq: Four times a day (QID) | ORAL | 0 refills | 8.00000 days | Status: AC | PRN
Start: 2023-08-26 — End: 2023-08-31

## 2023-08-26 MED FILL — CITALOPRAM 20 MG TABLET: 20 20 mg | ORAL | Qty: 1

## 2023-08-26 MED FILL — ENOXAPARIN 40 MG/0.4 ML SUBCUTANEOUS SYRINGE: 40 40 mg/0.4 mL | SUBCUTANEOUS | Qty: 0.4

## 2023-08-26 MED FILL — LEVOFLOXACIN 250 MG TABLET: 250 250 mg | ORAL | Qty: 2

## 2023-08-26 MED FILL — ACETAMINOPHEN 325 MG TABLET: 325 325 mg | ORAL | Qty: 2

## 2023-08-26 MED FILL — GABAPENTIN 100 MG CAPSULE: 100 100 mg | ORAL | Qty: 1

## 2023-08-26 MED FILL — POLYETHYLENE GLYCOL 3350 17 GRAM ORAL POWDER PACKET: 17 17 gram | ORAL | Qty: 1

## 2023-08-26 MED FILL — LIDOCAINE 4 % TOPICAL PATCH: 4 4 % | TOPICAL | Qty: 1

## 2023-08-26 MED FILL — SENNOSIDES 8.6 MG TABLET: 8.6 8.6 mg | ORAL | Qty: 1

## 2023-08-26 MED FILL — OXYCODONE 5 MG TABLET: 5 5 mg | ORAL | Qty: 2

## 2023-08-26 NOTE — Progress Notes (Signed)
08/26/23 1136   Case Management Discharge Note    Ambulance Log  Ambulance not needed     08/26/23 Case Management Progress Note: Patient is medically cleared for discharge home. Accepted by [facility] for PT/OT to begin 08/27/23.  [name], RN  11:37 AM

## 2023-08-26 NOTE — Nursing Note (Signed)
Patient discharged home with all belongings. Discharge instructions reviewed with patient and daughter at time of dc. Educated on antibiotics and pain management which she stated understanding of all instructions and her questions were answered. Unable to make f/u appointment with PCP and patient stated she would call this afternoon to make an appointment. IV removed prior to dc. She was wheeled to the main lobby to private vehicle.

## 2023-08-26 NOTE — Progress Notes (Signed)
[facility] REFERRAL FORM  Demographics       Address   [address] [place] [address]    Phone Numbers   Hm: [phone] Cell: [phone]    Marital Status   Widowed    Insurance Information   MEDICARE    Religion   None             Allergies (Reviewed on: 08/21/23)       Agent Severity Comments    Latex            Health Care Agents    There are no Health Care Agents on file.       Preferred Language       Preferred Language  English Preferred Written Language  English          Advanced Directive Assessment      Flowsheet Row Most Recent Value   Advance Directive Patient has advance directive, copy not in chart Filed at 08/21/2023 1929             Medication List        START taking these medications      levoFLOXacin 500 mg tablet  Commonly known as: Levaquin  Start taking on: August 27, 2023  Take 1 tablet (500 mg) by mouth once daily for 2 doses.  Last time this was given: August 26, 2023  8:54 AM     lidocaine 4 % patch  Start taking on: August 27, 2023  Apply 1 patch topically once daily for 15 days.  Last time this was given: August 26, 2023  8:54 AM     oxyCODONE 15 mg immediate release tablet  Commonly known as: Roxicodone  Take 0.5 tablets (7.5 mg) by mouth every 6 (six) hours if needed for pain score 7-10 for up to 5 days.  Last time this was given: August 25, 2023  9:39 PM            CONTINUE taking these medications      ALPRAZolam 0.5 mg tablet  Commonly known as: Xanax  Take 1 tablet (0.5 mg) by mouth if needed in the morning and at bedtime for anxiety.     citalopram 20 mg tablet  Commonly known as: CeleXA  Take 1 tablet (20 mg) by mouth once daily.  Last time this was given: August 26, 2023  8:54 AM     gabapentin 100 mg capsule  Commonly known as: Neurontin  Take 1 capsule (100 mg) by mouth at bedtime.  Last time this was given: August 25, 2023  9:19 PM     lisinopril 10 mg tablet  Take 10 mg by mouth in the morning.            STOP taking these medications       benzonatate 100 mg capsule  Commonly known as: Tessalon     oseltamivir 30 mg capsule  Commonly known as: Tamiflu     traMADol 50 mg tablet  Commonly known as: Ultram                    Discharge Referral Orders (From admission, onward)       Start     Ordered    08/26/23 0000  Ambulatory referral to Home Health/VNA        Comments: Special Instructions:      I attest that I or another qualified licensed provider saw Molly Jensen 90 days prior to  or 30 days post admission and this face to face encounter meets the necessary Home Health requirements. The face to face encounter occurred on 08/26/23.    The encounter with the patient was in whole, or in part, for the following medical condition, which is the primary reason for home health care. Syncope and collapse    I certify that, based on my findings, the following services are medically necessary skilled home health services: PT/OT.    Further, I certify that my clinical findings support this patient's homebound status (i.e. absences from home require considerable and taxing effort, are for health treatment, or for attendance at religious events; absences from home for nonmedical reasons are infrequent or are of relatively short duration).    The clinical findings that support the need for home care and homebound status are due to illness and the patient has a condition such that leaving his/her home is medically contraindicated.  There exists a normal inability to leave home and leaving home requires a considerable and taxing effort including worsening clinical course   Question:  Is patient a part of Maternal Child Health program?  Answer:  No    08/26/23 1133                  Nursing Post-Acute/Referral Information (Page 2)      Flowsheet Row Most Recent Value   Self Care Activities Functional Level    Dressing Independent   Grooming Independent   Feeding Independent   Bathing Independent   Toileting Independent   Bowel and Bladder    Last BM Date 08/25/23    Height and Weight    Height 1.575 m   Height Method Stated   Weight 68.9 kg   Weight Method Stated          After Discharge Information       None          General Therapy Patient Information (Page 3)      Flowsheet Row Most Recent Value   Precautions    Other Precautions Fall Risk Filed at 08/23/2023 1001   Home Living    Was Patient Admitted from STR? No Filed at 08/22/2023 0901   Lives With Alone Filed at 08/22/2023 0901   Type of Home House Filed at 08/22/2023 0901   Home Layout Two Level, Performs ADL's on One Level, Able to Live on One Level with Bedroom/Bathroom Filed at 08/22/2023 0901   Number of Stairs to Enter Home 1 Filed at 08/22/2023 0901   Number of Stairs Within Home 1 FOS Filed at 08/22/2023 0901   Bathroom Shower/Tub Tub/Shower Unit Filed at 08/22/2023 0901   Bathroom Toilet Standard Filed at 08/22/2023 [employer]   Home Equipment Rolling [place], Single [place] Filed at 08/22/2023 0901   Comments Per pt no AD at baseline, bedroom is upstairs,  however, family bought a bed for her to be able to sleep downstairs Filed at 08/22/2023 0901   Prior Level of Function    Information Provided By Patient Filed at 08/22/2023 0901   Independent at Baseline All Household Mobility, All Community Mobility, All ADL's, All ADL's/IADL's, Driving, Shopping, Without Device Filed at 08/22/2023 0901   History of Recent Falls Yes  Jaquita Rector for admission] Filed at 08/22/2023 0901   Work Status Retired  [employer]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   Comments 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 and active w/ OP PT Filed at 08/22/2023 0901   Cognition  Overall Cognitive Status WFL Filed at 08/26/2023 4098   Orientation Level Oriented X4 Filed at 08/26/2023 1191          Combined Therapy Assessment (Page 3)      Flowsheet Row Most Recent Value   Bed Mobility    Bed Position HOB flat, Use of bedrail Filed at 08/26/2023 0923   Supine to Sit Modified Independent Filed at 08/26/2023 4782   Scooting Supervision Filed at  08/23/2023 1001   Bed Mobility Comments VCs and education re: supine>sit transfers, minA at BLEs and trunk, increased time and effort required, performed w/ slow cautious movements Filed at 08/23/2023 1001   Transfers    Sit to Stand Independent Filed at 08/26/2023 9562   Stand to Sit Independent Filed at 08/26/2023 1308   Toilet Transfer Contact Guard, X1 Assist, Cues for Hand Placement, Cues for Sequencing Filed at 08/23/2023 1000   Transfers Comments increased time to perform transfers, good carryover of cues for hand plaacement and sequencing, able to perform multiple transfers throughout session Filed at 08/23/2023 1001          Physical Therapy - Mobility (Page 3)      Flowsheet Row Most Recent Value   Ambulation    Distance (Feet) 300 Filed at 08/26/2023 6578   Assistance Level Independent Filed at 08/26/2023 4696   Assistive Device None Filed at 08/26/2023 2952   Gait Characteristics reciprocal Filed at 08/26/2023 8413   Ambulation Comments limited due to pain Filed at 08/22/2023 0901   Stairs    Number of Stairs 9 Filed at 08/26/2023 2440   Assistance Level Supervision Filed at 08/26/2023 1027   Assistive Device None Filed at 08/26/2023 2536   Railing Right Filed at 08/26/2023 6440   Technique Step-to up and down  [protecting R side] Filed at 08/26/2023 3474   Stairs Comments both hands on rail for stability Filed at 08/26/2023 2595          Occupational Therapy(Page 3)      Flowsheet Row Most Recent Value   Functional Mobility    Distance Household distances Filed at 08/23/2023 1000   Assistance Level Contact Guard Filed at 08/23/2023 1000   Assistive Device Rolling Walker Filed at 08/23/2023 1000   Comments Pt amb incr distances using RW. Much improved with funct mobility cadence/ distance with support from RW. Pt reports tightness in ribs and pain, but not as bad as when she has spasms in her R side. Pt returned to sit in a chair. CTG A with funct mob and cues for sequencing/turning with RW. Filed at  08/23/2023 1000   ADL    Feeding Independent, Setup Filed at 08/23/2023 1000   Lower Body Dressing Maximum Assist Filed at 08/23/2023 1000   ADL Comments pt cont to req incr assist with all ADLS, difficulty with LE ADLs 2/2 bending and incr pain. Req max A with LB ADLS Filed at 08/23/2023 1000   IADL Comments Pt req incr assist with all IADLS 2/2 pain Filed at 08/23/2023 1000               Discharge Summary        Discharge Summary by Dustin Folks, MD at 08/26/2023 11:02 AM  Version 1 of 1      Author: Dustin Folks, MD Service: Internal Medicine Author Type: Physician    Filed: 08/26/2023 11:07 AM Date of Service: 08/26/2023 11:02 AM Status: Signed    Editor: Dustin Folks, MD (Physician)  Discharge Diagnosis  Syncope and collapse    Reason for Admission:   Molly Jensen is a 73 y.o. female history of  diabetes, hypertension, neuropathy and recurrent syncope who presents for evaluation of increasing frequency of falls as well as syncope last night with right sided rib pain.  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  She was recently diagnosed with influenza in December and was admitted last week.  Daughter feels that she has had increasing fatigue/weakness and increasing frequency of falls over the past month.  Patient reports that last night she had a fall where she did not strike her head, but later went to the bathroom and, after sitting on the toilet, syncopized.  She woke on the ground with right sided chest wall pain and right shoulder pain.  Currently her primary complaint is the right chest wall pain     Hospital Course   Molly Jensen is a 73 y.o. female history of   hypertension, neuropathy and recurrent syncope who presents for evaluation of increasing frequency of falls as well as syncope last night with right sided rib pain.     # Syncope and fall resulting in 9th and 10th rib fractures  # Right shoulder pain secondary to fall  # Age-related physical debility  -Syncopal  episode is most likely vasovagal related to micturition.As per daughter patient has been unsteady on her feet and has had multiple falls without loss of consciousness or lightheadedness  -X-ray does not demonstrate any right shoulder fracture  -Patient seen by PT and OT who recommended home with support and services  -Incentive spirometry  - Continue oxycodone 7.5 mg on discharge     # Hyperglycemia  -Daughter confirmed patient does not have a history of diabetes  -HbA1c is 5.8     # Constipation  -Continue bowel regimen as needed     #Positive sputum culture with Klebsiella, pansensitive.  Patient is complaining of occasional cough which is improved.  No respiratory distress.  No fever    Given prior history of longstanding pneumonia with a positive sputum culture I will start her on 5 days of levofloxacin, monitor qtc with daily ekg. this was discussed with the PCP over the phone.  Patient had CT scan of the chest on 1/1 which showed fractures but no consolidation  As per PCP respiratory culture is growing Candida.  Yeast in respiratory culture is more often a colonizer and does not warrant treatment outpatient follow-up with pulmonologist if symptoms persist.      Discharge status will be to home with services.  Pertinent Physical Exam At Time of Discharge  Physical Exam  Vitals and nursing note reviewed.   Constitutional:       Appearance: Normal appearance.   Pulmonary:      Effort: Pulmonary effort is normal.   Abdominal:      General: Abdomen is flat.      Palpations: Abdomen is soft.   Neurological:      Mental Status: She is alert. Mental status is at baseline.   Psychiatric:         Mood and Affect: Mood normal.           Recent Labs:   Lab Results   Component Value Date    GLUCOSE 89 08/26/2023    CALCIUM 9.6 08/26/2023    NA 135 08/26/2023    K 4.4 08/26/2023    CO2 30 08/26/2023    CL 102  08/26/2023    BUN 23 08/26/2023    CREATININE 1.12 08/26/2023     Lab Results   Component Value Date    WBC 5.5  08/26/2023    HGB 12.0 08/26/2023    HCT 38.3 08/26/2023    MCV 92.5 08/26/2023    PLT 418 (H) 08/26/2023     Lab Results   Component Value Date    INR 1.00 08/12/2023     Lab Results   Component Value Date    ALT 20 08/21/2023    AST 30 08/21/2023    ALKPHOS 122 08/21/2023    BILITOT 0.4 08/21/2023     Lab Results   Component Value Date    HGBA1C 5.8 (H) 08/21/2023      No results found for: "CHOL"  No results found for: "HDL"  No results found for: "LDLCALC"  No results found for: "TRIG"  No results found for: "CHOLHDL"  No results found for: "CKTOTAL", "CKMB", "CKMBINDEX", "TROPONINI"  Cultures       ** No results found for the last 72 hours. **                Procedures and Tests:   === 08/21/23 ===    CT CHEST WO CONTRAST    - Impression -  Right posterolateral ninth and 10th rib fractures.    Darrow Bussing, MD 08/21/2023 12:21 PM    === 08/21/23 ===    XR SHOULDER 2+ VIEWS RIGHT    - Impression -  No shoulder fracture identified. Right lateral ninth rib fracture again demonstrated.      Darrow Bussing, MD 08/21/2023 1:35 PM      Discharge Medications     Your medication list        START taking these medications        Instructions Last Dose Given Next Dose Due   levoFLOXacin 500 mg tablet  Commonly known as: Levaquin  Start taking on: August 27, 2023      Take 1 tablet (500 mg) by mouth once daily for 2 doses.       lidocaine 4 % patch  Start taking on: August 27, 2023      Apply 1 patch topically once daily for 15 days.       oxyCODONE 15 mg immediate release tablet  Commonly known as: Roxicodone      Take 0.5 tablets (7.5 mg) by mouth every 6 (six) hours if needed for pain score 7-10 for up to 5 days.              CONTINUE taking these medications        Instructions Last Dose Given Next Dose Due   ALPRAZolam 0.5 mg tablet  Commonly known as: Xanax      Take 1 tablet (0.5 mg) by mouth if needed in the morning and at bedtime for anxiety.       citalopram 20 mg tablet  Commonly known as: CeleXA      Take 1  tablet (20 mg) by mouth once daily.       gabapentin 100 mg capsule  Commonly known as: Neurontin      Take 1 capsule (100 mg) by mouth at bedtime.       lisinopril 10 mg tablet      Take 10 mg by mouth in the morning.              STOP taking these medications  benzonatate 100 mg capsule  Commonly known as: Tessalon        oseltamivir 30 mg capsule  Commonly known as: Tamiflu        traMADol 50 mg tablet  Commonly known as: Ultram                  Where to Get Your Medications        These medications were sent to [facility] #[id number] [facility] [place] - [address] AT [facility] [address]  [address], [place] [address]      Phone: [phone]   levoFLOXacin 500 mg tablet  lidocaine 4 % patch  oxyCODONE 15 mg immediate release tablet           Issues Requiring Follow-Up      Test Results Pending At Discharge      Outpatient Follow-Up  No future appointments.    Patient was instructed to call or return with any chest pain, shortness of breath, lightheadedness or dizziness, nausea or vomiting, abdominal pain, or any symptoms concerning to the patient or family. She should follow up with PCP within 1 week.    Discharge plan was discussed with the patient and she verbalized understanding. Educational materials provided. Total time spent in face to face encounter, discharge planning and process was greater than 30 minutes.

## 2023-08-26 NOTE — Discharge Summary (Signed)
Discharge Diagnosis  Syncope and collapse    Reason for Admission:   Molly Jensen is a 73 y.o. female history of  diabetes, hypertension, neuropathy and recurrent syncope who presents for evaluation of increasing frequency of falls as well as syncope last night with right sided rib pain.  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  She was recently diagnosed with influenza in December and was admitted last week.  Daughter feels that she has had increasing fatigue/weakness and increasing frequency of falls over the past month.  Patient reports that last night she had a fall where she did not strike her head, but later went to the bathroom and, after sitting on the toilet, syncopized.  She woke on the ground with right sided chest wall pain and right shoulder pain.  Currently her primary complaint is the right chest wall pain     Hospital Course   Molly Jensen is a 73 y.o. female history of   hypertension, neuropathy and recurrent syncope who presents for evaluation of increasing frequency of falls as well as syncope last night with right sided rib pain.     # Syncope and fall resulting in 9th and 10th rib fractures  # Right shoulder pain secondary to fall  # Age-related physical debility  -Syncopal episode is most likely vasovagal related to micturition.As per daughter patient has been unsteady on her feet and has had multiple falls without loss of consciousness or lightheadedness  -X-ray does not demonstrate any right shoulder fracture  -Patient seen by PT and OT who recommended home with support and services  -Incentive spirometry  - Continue oxycodone 7.5 mg on discharge     # Hyperglycemia  -Daughter confirmed patient does not have a history of diabetes  -HbA1c is 5.8     # Constipation  -Continue bowel regimen as needed     #Positive sputum culture with Klebsiella, pansensitive.  Patient is complaining of occasional cough which is improved.  No respiratory distress.  No  fever    Given prior history of longstanding pneumonia with a positive sputum culture I will start her on 5 days of levofloxacin, monitor qtc with daily ekg. this was discussed with the PCP over the phone.  Patient had CT scan of the chest on 1/1 which showed fractures but no consolidation  As per PCP respiratory culture is growing Candida.  Yeast in respiratory culture is more often a colonizer and does not warrant treatment outpatient follow-up with pulmonologist if symptoms persist.      Discharge status will be to home with services.  Pertinent Physical Exam At Time of Discharge  Physical Exam  Vitals and nursing note reviewed.   Constitutional:       Appearance: Normal appearance.   Pulmonary:      Effort: Pulmonary effort is normal.   Abdominal:      General: Abdomen is flat.      Palpations: Abdomen is soft.   Neurological:      Mental Status: She is alert. Mental status is at baseline.   Psychiatric:         Mood and Affect: Mood normal.           Recent Labs:   Lab Results   Component Value Date    GLUCOSE 89 08/26/2023    CALCIUM 9.6 08/26/2023    NA 135 08/26/2023    K 4.4 08/26/2023    CO2 30 08/26/2023    CL 102  08/26/2023    BUN 23 08/26/2023    CREATININE 1.12 08/26/2023     Lab Results   Component Value Date    WBC 5.5 08/26/2023    HGB 12.0 08/26/2023    HCT 38.3 08/26/2023    MCV 92.5 08/26/2023    PLT 418 (H) 08/26/2023     Lab Results   Component Value Date    INR 1.00 08/12/2023     Lab Results   Component Value Date    ALT 20 08/21/2023    AST 30 08/21/2023    ALKPHOS 122 08/21/2023    BILITOT 0.4 08/21/2023     Lab Results   Component Value Date    HGBA1C 5.8 (H) 08/21/2023      No results found for: "CHOL"  No results found for: "HDL"  No results found for: "LDLCALC"  No results found for: "TRIG"  No results found for: "CHOLHDL"  No results found for: "CKTOTAL", "CKMB", "CKMBINDEX", "TROPONINI"  Cultures       ** No results found for the last 72 hours. **                Procedures and Tests:    === 08/21/23 ===    CT CHEST WO CONTRAST    - Impression -  Right posterolateral ninth and 10th rib fractures.    Darrow Bussing, MD 08/21/2023 12:21 PM    === 08/21/23 ===    XR SHOULDER 2+ VIEWS RIGHT    - Impression -  No shoulder fracture identified. Right lateral ninth rib fracture again demonstrated.      Darrow Bussing, MD 08/21/2023 1:35 PM      Discharge Medications     Your medication list        START taking these medications        Instructions Last Dose Given Next Dose Due   levoFLOXacin 500 mg tablet  Commonly known as: Levaquin  Start taking on: August 27, 2023      Take 1 tablet (500 mg) by mouth once daily for 2 doses.       lidocaine 4 % patch  Start taking on: August 27, 2023      Apply 1 patch topically once daily for 15 days.       oxyCODONE 15 mg immediate release tablet  Commonly known as: Roxicodone      Take 0.5 tablets (7.5 mg) by mouth every 6 (six) hours if needed for pain score 7-10 for up to 5 days.              CONTINUE taking these medications        Instructions Last Dose Given Next Dose Due   ALPRAZolam 0.5 mg tablet  Commonly known as: Xanax      Take 1 tablet (0.5 mg) by mouth if needed in the morning and at bedtime for anxiety.       citalopram 20 mg tablet  Commonly known as: CeleXA      Take 1 tablet (20 mg) by mouth once daily.       gabapentin 100 mg capsule  Commonly known as: Neurontin      Take 1 capsule (100 mg) by mouth at bedtime.       lisinopril 10 mg tablet      Take 10 mg by mouth in the morning.              STOP taking these medications      benzonatate  100 mg capsule  Commonly known as: Tessalon        oseltamivir 30 mg capsule  Commonly known as: Tamiflu        traMADol 50 mg tablet  Commonly known as: Ultram                  Where to Get Your Medications        These medications were sent to [facility] #[id number] [facility], [place] - [address] AT [facility] [address]  [address], [place] [address]      Phone: [phone]    levoFLOXacin 500 mg tablet  lidocaine 4 % patch  oxyCODONE 15 mg immediate release tablet           Issues Requiring Follow-Up      Test Results Pending At Discharge      Outpatient Follow-Up  No future appointments.    Patient was instructed to call or return with any chest pain, shortness of breath, lightheadedness or dizziness, nausea or vomiting, abdominal pain, or any symptoms concerning to the patient or family. She should follow up with PCP within 1 week.    Discharge plan was discussed with the patient and she verbalized understanding. Educational materials provided. Total time spent in face to face encounter, discharge planning and process was greater than 30 minutes.

## 2023-08-26 NOTE — Progress Notes (Signed)
08/21/23 1504   Case Management Initial Assessment   Patient Discharged Before Able to Interview/Assess No   Source of Information Family   Type of Residence Multilevel Home   Lives With Alone   Discharge Services Anticipated at this Time Yes   Expected Discharge Needs VNA;SNF   Patient Information   Interpreter Needs None   Home Caregiver Family   Accompanied by Family   Support Systems Children/Family   Need PCP on Discharge No   Current Patient Responsibilities Personal Care;Housekeeping;Meal Prep;Shopping;Driving   PTA Functional Status   Is patient prescribed an anti-coagulant? No   Current Functional Status   Bathing Independent   Toileting Independent   Walking in Home Independent   Assistive Device Used None   Community Mobility Independent     Case Management Progress Note:   Spoke to the daughter Molly Jensen for initial screen. Pt lives alone. I iwith Adls and IADLS PTA. Was recently discharged home independently on 12/24. Tested +flu 12/23. Daughter at her bedside. Daughter lives close to the daughter. Came in with multiple falls at home. Explained to the daughter that she will need a 3 midnight inpatient stay in order to qualify for rehab. Will have PT work with her. Daughter verbalized understanding. COC to follow   [name], RN  08/21/2023  3:09 PM      Case Management Progress Note: Chart reviewed and spoke with pt over the phone as TW at main campus. Pt aware PT/OT rec acute rehab. Reviewed local acute rehabs in the area. She chooses [facility] as first Tour manager as second. Referrals placed and pending  [name], RN  08/23/23  7:44 AM    08/25/23 Case Management Progress Note: [facility] not able to accept patient, not meeting acute criteria . CM spoke with patient in person with her sister by phone about STR options. Screens sent.per preference .  [name], RN  4:22 PM   08/26/23 Case Management Progress Note: Seen by PT this am , now recommending home with services. Screen out to  [facility] for day after d/c home visit.  [name], RN  10:53 AM

## 2023-08-26 NOTE — Progress Notes (Signed)
Physical Therapy Treatment  Progress Note    Patient Name: Molly Jensen  MRN: [id number]  DOB: 29-Feb-1951  Today's Date: 08/26/2023    Subjective   Pertinent Medical Changes: pain significantly improving  Pt agreeable to participate.  Reports feeling much better and declined to take pain meds earlier today.    Objective     General Information  General Info  PT Received On: 08/26/23  Following Therapy Session:: Call bell within reach, Patient in Chair, Nursing Staff Aware of Patient Location  Plan of Care Reviewed With:: Patient, RN/Charge Nurse  Recommended mobility with nursing staff: independent  Medical Information  Pertinent Medical Changes: pain significantly improving    Pain  Pain Assessment  Pain Assessment: 0-10  Pain Score: 4  Pain Type: Acute pain  Pain Location: Rib cage  Pain Orientation: Right  Pain Management Interventions: care clustered, position adjusted      Cognition  Overall Cognitive Status: Within Functional Limits    Integumentary  Integumentary  Integumentary: visible skin intact, PIV  LDA Integrity: Intact Pre and Post Therapy    Mobility Assessment  Bed Position: HOB flat, Use of bedrail  Supine to Sit: Modified Independent    Sit to Stand: Independent  Stand to Sit: Independent    Distance (Feet): 300  Assistance Level: Independent  Assistive Device: None  Gait Characteristics: reciprocal    Number of Stairs: 9  Assistance Level: Supervision  Assistive Device: None  Railing: Right  Technique: Step-to up and down (protecting R side)  Stairs Comments: both hands on rail for stability         [employer] Balance Scale: Yes  KU Sitting Balance Scale: 4+ - Independently moves and returns to center of gravity 1-2 inches in multiple planes.  KU Standing Balance Scale: 4+ - Independently moves and returns to center of gravity 1-2 inches in multiple planes.    AMPAC - (Basic Mobility Inpatient Short Form) How much help from another person do you currently need?  Turning from your back  to your side while in a flat bed without using bedrails?: 4 - None  Moving from lying on your back to sitting on the side of a flat bed without using bedrails?: 4 - None  Moving to and from a bed to a chair (including a wheelchair)?: 4 - None  Standing up from a chair using your arms (e.g., wheelchair, or bedside chair)?: 4 - None  To walk in hospital room?: 4 - None  Climbing 3-5 steps with a railing?: 4 - None  Raw Score: 24       Education  Education  Education Provided: Role of PT/Plan of Care, Discharge Planning, Development worker, community, Safety Awareness/Fall Risk, Activities Guidelines, Energy Conservation  Education Provided to: Patient  Teaching Method: Discussion, Demonstration  Barriers to learning: None evident  Learning Evaluation: Verbalizes understanding, Demonstrates/applies knowledge    Assessment   Pt completed bed mob, transfers, amb, stairs.  Anticipate d/c home, pt comfortable with plan.  Encouraged getting family assist at this time for IADLs and getting in/out of shower for safety at this time as pt does have some ongoing pain issues.  Able to don/doff socks no assist via cross legged in short sitting.  Recommend home PT services.  No further indication for acute PT services at this time, encouraged pt to mobilize as she is comfortable.    Goals  Short Term Goals  Date Established/Amended: 08/26/23  Goal timeframe: 2 wks  Bed mobility goal: pt will  perform bed mobility independently (discontinue)  Transfers goal: Pt will perform all transfers independently (goal met)  Ambulation goal: Pt will ambulate >/= 164ft independently (goal met)  Stairs goal: Pt will negotiate FOS indepedently (discontinue)         Plan   Plan: Discharge from Skilled Inpatient PT Services   Treatment Interventions:    PT Frequency and Duration:      Recommendations  Anticipate Discharge to: Home with support/services  Discharge Assist/Support Needed: Intermittent Supervision, Physical assistance from family/caregiver  Home  services recommended: Home PT  Recommended mobility with nursing staff: independent    Clearance Coots, PT  [place] lic # [id number]

## 2023-08-26 NOTE — Other (Signed)
Patient Education  Table of Contents   Rib Fracture    To view videos and all your education online visit,  [email or web address].elsevier.com/[email or web address]  or scan this QR code with your smartphone.  Access to this content will expire in one year.  Rib Fracture    A rib fracture is a break or crack in one of the bones of the ribs. The ribs are like a cage that goes around your upper chest. A broken or cracked rib is often painful, but most do not cause other problems. Most rib fractures usually heal on their own in 1?3 months.  What are the causes?   Doing movements over and over again with a lot of force, such as pitching a baseball or having a very bad cough.   A direct hit to the chest.   Cancer that has spread to the bones.  What are the signs or symptoms?   Pain when you breathe in or cough.   Pain when someone presses on the injured area.   Feeling short of breath.  How is this treated?  Treatment depends on how bad the fracture is. In general:   Most rib fractures usually heal on their own in 1?3 months.   Healing may take longer if you have a cough or are doing activities that make the injury worse.   While you heal, you may be given medicines to control pain.   You will also be taught deep breathing exercises.   Very bad injuries may require a stay at the hospital or surgery.  Follow these instructions at home:  Managing pain, stiffness, and swelling   If told, put ice on the injured area. To do this:  ? Put ice in a plastic bag.  ? Place a towel between your skin and the bag.  ? Leave the ice on for 20 minutes, 2?3 times a day.  ? Take off the ice if your skin turns bright red. This is very important. If you cannot feel pain, heat, or cold, you have a greater risk of damage to the area.   Take over-the-counter and prescription medicines only as told by your doctor.  Activity   Avoid activities that cause pain to the injured area. Protect your injured area.   Slowly increase activity as told by your doctor.  General  instructions   Do deep breathing exercises as told by your doctor. You may be told to:  ? Take deep breaths many times a day.  ? Cough several times a day while hugging a pillow.  ? Use a device (incentive spirometer) to do deep breathing many times a day.   Drink enough fluid to keep your pee (urine) clear or pale yellow.   Do not wear a rib belt or binder.   Keep all follow-up visits.  Contact a doctor if:   You have a fever.  Get help right away if:   You have trouble breathing.   You are short of breath.   You cannot stop coughing.   You cough up thick or bloody spit.   You feel like you may vomit (nauseous), vomit, or have belly (abdominal) pain.   Your pain gets worse and medicine does not help.  These symptoms may be an emergency. Get help right away. Call your local emergency services (911 in the [place]   Do not wait to see if the symptoms will go away.   Do not drive yourself to the hospital.  Summary  A rib fracture is a break or crack in one of the bones of the ribs.   Apply ice to the injured area and take medicines for pain as told by your doctor.   Take deep breaths and cough several times a day. Hug a pillow every time you cough.  This information is not intended to replace advice given to you by your health care provider. Make sure you discuss any questions you have with your health care provider.  Document Released: 2008-05-15 Document Updated: 2023-06-24 Document Reviewed: 2023-06-24  Elsevier Patient Education ? 2024 [employer].

## 2023-08-26 NOTE — Discharge Instructions (Signed)
Regular diet

## 2023-08-26 NOTE — Care Plan (Signed)
Goal Outcome Evaluation:  Plan of Care Reviewed With: patient  Progress: no change  Outcome Evaluation: Patient is A&OX4. Pleasant/ cooperative upon approach. C/O R shoulder pain/ R Rib pain 8 out of 10 at bedtime. Medicated with Oxycodone 7.5 mg PRN/PO and Tylenol 650mg  PRN, with good effect. Got up few times to use the bathroom to void with assist. Also, reported 2 BM's from taking Dulcolax suppository earlier on shift. Encouraged the use of spirometer at the bedside. Reported feeling better and hopeful to go be d/c today. Call light within reach. Will continue with plan of care.

## 2023-09-10 ENCOUNTER — Inpatient Hospital Stay: Admit: 2023-09-10 | Payer: MEDICARE

## 2023-09-10 DIAGNOSIS — S42214A Unspecified nondisplaced fracture of surgical neck of right humerus, initial encounter for closed fracture: Secondary | ICD-10-CM

## 2023-09-12 NOTE — Telephone Encounter (Signed)
Left vm for pt to call office to schedule new pt appt, referred by pcp for abnormal ctscan chest

## 2023-09-13 ENCOUNTER — Ambulatory Visit: Admit: 2023-09-13 | Payer: MEDICARE

## 2023-09-13 DIAGNOSIS — S42201A Unspecified fracture of upper end of right humerus, initial encounter for closed fracture: Secondary | ICD-10-CM

## 2023-09-13 NOTE — Progress Notes (Signed)
Patient ID: Molly Jensen is a 73 y.o. female.  Pt referred to Ortho by dr Delories Heinz s/p Rt Proximal humerus fx.   Sling and swathe applied Pt advised on care, use and application of device  Splinting   Date/Time: 09/13/2023 11:30 AM   Performed by: Merdis Delay   Authorized by: Margaree Mackintosh, MD   Consent given by: patient  Injury  Location details: right shoulder  Fracture type: surgical neck fracture    Pre-procedure assessment  neurovascularly intact  Range of motion: reduced    Procedure  Manipulation performed? no manipulation performed  Immobilization: sling  Supplies used: sling and swathe.  Post-procedure assessment  neurovascularly intact  Range of motion: unchanged  Patient tolerance: patient tolerated the procedure well with no immediate complications

## 2023-09-17 ENCOUNTER — Inpatient Hospital Stay
Admission: EM | Admit: 2023-09-17 | Discharge: 2023-09-20 | Disposition: A | Discharge status: Skilled Nursing Facility | Payer: MEDICARE

## 2023-09-17 ENCOUNTER — Emergency Department: Admit: 2023-09-17 | Payer: MEDICARE

## 2023-09-17 DIAGNOSIS — R296 Repeated falls: Secondary | ICD-10-CM

## 2023-09-17 DIAGNOSIS — W19XXXA Unspecified fall, initial encounter: Secondary | ICD-10-CM

## 2023-09-17 LAB — COMPREHENSIVE METABOLIC PANEL
ALT: 21 U/L (ref 0–55)
AST: 25 U/L (ref 6–42)
Albumin: 3.6 g/dL (ref 3.2–5.0)
Alkaline phosphatase: 114 U/L (ref 30–130)
Anion Gap: 3 mmol/L (ref 3–14)
BUN: 16 mg/dL (ref 6–24)
Bilirubin, total: 0.6 mg/dL (ref 0.2–1.2)
CO2 (Bicarbonate): 27 mmol/L (ref 20–32)
Calcium: 8.8 mg/dL (ref 8.5–10.5)
Chloride: 109 mmol/L (ref 98–110)
Creatinine: 1.13 mg/dL (ref 0.55–1.30)
Glucose: 98 mg/dL (ref 70–110)
Potassium: 3.8 mmol/L (ref 3.6–5.2)
Protein, total: 7.1 g/dL (ref 6.0–8.4)
Sodium: 139 mmol/L (ref 135–146)
eGFRcr: 52 mL/min/{1.73_m2} — ABNORMAL LOW (ref >=60)

## 2023-09-17 LAB — CBC WITH DIFFERENTIAL
Basophils %: 0.6 %
Basophils Absolute: 0.06 10*3/uL (ref 0.00–0.22)
Eosinophils %: 8 %
Eosinophils Absolute: 0.86 10*3/uL — ABNORMAL HIGH (ref 0.00–0.50)
Hematocrit: 39.4 % (ref 32.0–47.0)
Hemoglobin: 12.4 g/dL (ref 11.0–16.0)
Immature Granulocytes %: 0.3 %
Immature Granulocytes Absolute: 0.03 10*3/uL (ref 0.00–0.10)
Lymphocyte %: 20.4 %
Lymphocytes Absolute: 2.21 10*3/uL (ref 0.70–4.00)
MCH: 29.5 pg (ref 26.0–34.0)
MCHC: 31.5 g/dL (ref 31.0–37.0)
MCV: 93.6 fL (ref 80.0–100.0)
MPV: 9 fL — ABNORMAL LOW (ref 9.1–12.4)
Monocytes %: 7.3 %
Monocytes Absolute: 0.79 10*3/uL — ABNORMAL HIGH (ref 0.36–0.77)
NRBC %: 0 % (ref 0.0–0.0)
NRBC Absolute: 0 10*3/uL (ref 0.00–2.00)
Neutrophil %: 63.4 %
Neutrophils Absolute: 6.86 10*3/uL (ref 1.50–7.95)
Platelets: 288 10*3/uL (ref 150–400)
RBC: 4.21 M/uL (ref 3.70–5.20)
RDW-CV: 14.8 % — ABNORMAL HIGH (ref 11.5–14.5)
RDW-SD: 50.3 fL (ref 35.0–51.0)
WBC: 10.8 10*3/uL (ref 4.0–11.0)

## 2023-09-17 LAB — TROPONIN I, HIGH SENSITIVITY: Troponin I, High Sensitivity: 14 ng/L

## 2023-09-17 LAB — TSH WITH REFLEX: TSH: 1.74 u[IU]/mL (ref 0.358–3.740)

## 2023-09-17 LAB — MAGNESIUM: Magnesium: 1.9 mg/dL (ref 1.6–2.6)

## 2023-09-17 LAB — SARS-COV-2 AND INFLUENZA A/B RNA BY PCR
Influenza A RNA: NOT DETECTED
Influenza B RNA: NOT DETECTED
SARS-CoV-2 RNA PCR: NOT DETECTED

## 2023-09-17 LAB — CK: CK: 419 U/L — ABNORMAL HIGH (ref 30.0–200.0)

## 2023-09-17 MED ORDER — HYDROmorphone (Dilaudid) injection 0.25 mg
0.5 | Freq: Once | INTRAMUSCULAR | Status: AC
Start: 2023-09-17 — End: 2023-09-17
  Administered 2023-09-17: 21:00:00 0.25 mg via INTRAVENOUS

## 2023-09-17 MED ORDER — acetaminophen (Tylenol) tablet 650 mg
325 | Freq: Four times a day (QID) | ORAL | Status: DC | PRN
Start: 2023-09-17 — End: 2023-09-20
  Administered 2023-09-19 – 2023-09-20 (×3): 650 mg via ORAL

## 2023-09-17 MED ORDER — enoxaparin (Lovenox) syringe 40 mg
40 | Freq: Every day | SUBCUTANEOUS | Status: DC
Start: 2023-09-17 — End: 2023-09-20
  Administered 2023-09-18 – 2023-09-20 (×3): 40 mg via SUBCUTANEOUS

## 2023-09-17 MED ORDER — acetaminophen (Tylenol) solution 650 mg
325 | Freq: Four times a day (QID) | ORAL | Status: DC | PRN
Start: 2023-09-17 — End: 2023-09-20

## 2023-09-17 MED ORDER — HYDROmorphone (Dilaudid) injection 0.2 mg
0.5 | INTRAMUSCULAR | Status: DC | PRN
Start: 2023-09-17 — End: 2023-09-18
  Administered 2023-09-18 (×3): 0.2 mg via INTRAVENOUS

## 2023-09-17 MED ORDER — sodium chloride 0.9 % infusion
0.9 | INTRAVENOUS | Status: AC
Start: 2023-09-17 — End: 2023-09-18
  Administered 2023-09-18 (×2): 100 mL/h via INTRAVENOUS

## 2023-09-17 MED ORDER — acetaminophen (Tylenol) suppository 650 mg
650 | Freq: Four times a day (QID) | RECTAL | Status: DC | PRN
Start: 2023-09-17 — End: 2023-09-20

## 2023-09-17 MED ORDER — melatonin tablet 6 mg
3 | Freq: Every evening | ORAL | Status: DC | PRN
Start: 2023-09-17 — End: 2023-09-20

## 2023-09-17 MED ORDER — ondansetron (Zofran) injection 4 mg
4 | Freq: Three times a day (TID) | INTRAMUSCULAR | Status: DC | PRN
Start: 2023-09-17 — End: 2023-09-20

## 2023-09-17 MED ORDER — ondansetron ODT (Zofran-ODT) disintegrating tablet 4 mg
4 | Freq: Three times a day (TID) | ORAL | Status: DC | PRN
Start: 2023-09-17 — End: 2023-09-20

## 2023-09-17 MED FILL — HYDROMORPHONE 0.5 MG/0.5 ML INJECTION WRAPPER: 0.5 0.5 mg/0.5 mL | INTRAMUSCULAR | Qty: 0.5

## 2023-09-17 NOTE — ED Notes (Signed)
Pt placed in knee immobilizer per MD Wayman, pt tolerated well.       Davene Costain, RN  09/17/23 2127

## 2023-09-17 NOTE — ED Provider Notes (Signed)
[facility]  [address]  [place] [address]    [facility]  [address]  [place] [address]     HPI   Chief Complaint   Patient presents with    Fall        This is a 73 year old female who has a somewhat complex past medical history as have listed below here.  She has  diabetes, hypertension, neuropathy and recurrent syncope.  She has a history of recurrent pneumonia as well.  She comes in today after another fall.  She was in the hospital recently.  Discharged not to rehab and comes back with a fall and bilateral leg weakness.  She states that she got up around midnight.  She fell while trying to use her walker to get around the house.  She states her legs were very weak at that time.  It was bilateral and she had radicular pains down the legs bilaterally lateral and then into the anterior legs.  This went down to the knee.  She has pain in the distal femur and knee on both sides.  She has no chest pain.  She is not short of breath.  She has a mild headache and neck pain.  She has low back pain which is chronic.  She has had surgery in the past.  She has pain in the right arm and right ribs.  She had rib fractures recently and was admitted for this.  The right ribs are about at baseline.  The right arm and shoulder and the humerus and shoulder are more painful than they have been          Patient History   Past Medical History:   Diagnosis Date    Degenerative joint disease     Hypertension      Past Surgical History:   Procedure Laterality Date    CHOLECYSTECTOMY      CTA CHEST W AND WO CONTRAST  08/12/2023    CTA CHEST W AND WO CONTRAST 08/12/2023 [facility] CT    HYSTERECTOMY       Family History   Problem Relation Name Age of Onset    No Known Problems Mother      No Known Problems Father       Social History     Tobacco Use    Smoking status: Never    Smokeless tobacco: Never   Vaping Use    Vaping status: Never Used   Substance Use Topics     Alcohol use: Never    Drug use: Never       Review of Systems   Review of Systems    Physical Exam   ED Triage Vitals   Temp Pulse Resp BP   09/17/23 1301 09/17/23 1301 09/17/23 1301 09/17/23 1301   35.7 C (96.2 F) 89 20 122/82      SpO2 Temp Source Heart Rate Source Patient Position   09/17/23 1301 09/17/23 1301 09/17/23 1301 09/17/23 2015   95 % Tympanic Monitor Semi-Fowler      BP Location FiO2 (%)     09/17/23 2015 --     Right arm        Physical Exam  Vitals and nursing note reviewed.   Constitutional:       Appearance: Normal appearance.   HENT:      Head: Normocephalic and atraumatic.   Eyes:      Conjunctiva/sclera: Conjunctivae normal.  Cardiovascular:      Rate and Rhythm: Normal rate and regular rhythm.   Pulmonary:      Effort: Pulmonary effort is normal.      Breath sounds: Normal breath sounds.   Abdominal:      Palpations: Abdomen is soft.      Tenderness: There is no abdominal tenderness. There is no guarding.   Musculoskeletal:      Cervical back: Neck supple.      Comments: The patient has mild tenderness to palpation in her back on all sides in the lower back.  She has pain in the left distal thigh and knee with straight leg raise on the left.  Straight leg raises negative on both sides for any pain below the knee.  Normal plantar and dorsiflexion.  When I hold her leg off the bed at the thigh she can bend her knee and flex her knee with good strength probably 4 out of 5   Skin:     General: Skin is warm and dry.   Neurological:      Mental Status: She is alert.      Comments: She is awake and alert.  The right arm is very difficult to examine given that it is quite painful.  She is able to flex and extend the fingers.  She can flex and extend the wrist.  She has no deficits in left upper extremity both lower extremities are weak and she can lift them off the bed but weakly on both sides.  She has pain in the right knees bilaterally that is limiting this to some extent.     Psychiatric:          Mood and Affect: Mood normal.         Behavior: Behavior normal.       XR KNEE LEFT 3 VIEWS   Final Result   No evidence of a fracture or dislocation.      Michael Litter, MD 09/17/2023 6:13 PM      XR KNEE RIGHT 3 VIEWS   Final Result      1. Large joint effusion.    2. Mild to moderate tricompartmental degenerative changes.   3. Findings suspicious for fractured osteophyte along superior patella. Correlation with pain symptoms is advised.         Delice Bison, MD 09/17/2023 6:04 PM      XR HUMERUS RIGHT   Final Result   Fracture in the right proximal humeral surgical neck.      Michael Litter, MD 09/17/2023 6:08 PM      XR RIBS RIGHT 2 VIEWS W CHEST   Final Result   Fractures in the right ninth and 10th ribs.   Fracture in the right proximal humerus.      Michael Litter, MD 09/17/2023 6:11 PM      CT HEAD WO CONTRAST   Final Result      No acute territorial infarct, space-occupying lesion, or intracranial hemorrhage.      Florencia Reasons 09/17/2023 4:37 PM      CT CERVICAL SPINE WO CONTRAST   Final Result      1.  No acute traumatic injury of the cervical spine.      Abigail Butts, MD 09/17/2023 4:40 PM      CT LUMBAR SPINE WO CONTRAST   Final Result      Mild degenerative changes without radiographically apparent acute findings.      Ardis Rowan, MD 09/17/2023 4:45 PM  Labs Reviewed   COMPREHENSIVE METABOLIC PANEL - Abnormal       Result Value    Sodium 139      Potassium 3.8      Chloride 109      CO2 (Bicarbonate) 27      Anion Gap 3      BUN 16      Creatinine 1.13      eGFRcr 52 (*)     Glucose 98      Fasting? Unknown      Calcium 8.8      AST 25      ALT 21      Alkaline phosphatase 114      Protein, total 7.1      Albumin 3.6      Bilirubin, total 0.6     CBC WITH DIFFERENTIAL - Abnormal    WBC 10.8      RBC 4.21      Hemoglobin 12.4      Hematocrit 39.4      MCV 93.6      MCH 29.5      MCHC 31.5      RDW-CV 14.8 (*)     RDW-SD 50.3      Platelets 288      MPV 9.0 (*)     Neutrophil % 63.4      Lymphocyte %  20.4      Monocytes % 7.3      Eosinophils % 8.0      Basophils % 0.6      Immature Granulocytes % 0.3      NRBC % 0.0      Neutrophils Absolute 6.86      Lymphocytes Absolute 2.21      Monocytes Absolute 0.79 (*)     Eosinophils Absolute 0.86 (*)     Basophils Absolute 0.06      Immature Granulocytes Absolute 0.03      NRBC Absolute 0.00     CK - Abnormal    CK 419.0 (*)    URINALYSIS REFLEX TO CULTURE - Abnormal    Color, Ur Yellow      Clarity, Ur Clear      Specific Gravity, Ur 1.015      pH, Ur 5.0      Protein,Ur Negative      Glucose,Ur Negative      Ketones, Ur Negative      Bilirubin, Ur Negative      Blood, Ur Negative      Urobilinogen, Ur 0.2      Nitrite, Ur Negative      Leukocyte Esterase, Ur Trace (*)     RBC, Ur 0      WBC, Ur 4      Epithelial Cells, UR 4      Bacteria, Ur None Seen      Casts, Ur 0     MAGNESIUM - Normal    Magnesium 1.9     TROPONIN I, HIGH SENSITIVITY - Normal    Troponin I, High Sensitivity 14      Narrative:     Clinical correlation, HEART Score and shared decision making must be taken into account.                   For Chest Pain >3 Hours    Rule-Out Criteria              Single Value     0hr/1hr Delta Value  Female  <10ng/L*          <54ng/L AND delta <15ng/L  Female    <10ng/L*          <79ng/L AND delta <15ng/L    Cannot Rule-Out                            0hr/1hr Delta Value  Female    <54ng/L AND delta >15ng/L    >/= 54 AND delta </=15ng/L  Female      <79ng/L AND delta >15ng/L    >/= 79 AND delta </=15ng/L    Rule-In Criteria               Single Value   0hr/1hr Delta Value                 Female   >115ng/L       >/=54ng/L AND delta >/=15ng/L         Female     >115ng/L       >/=79ng/L AND delta >/=15ng/L           *Note: for Chest pain <3 hours 0hr/1hr is warranted for evaluation.     TSH WITH REFLEX - Normal    TSH 1.740     SARS-COV-2 AND INFLUENZA A/B RNA BY PCR - Normal    Influenza A RNA Not Detected      Influenza B RNA Not Detected      SARS-CoV-2 RNA PCR Not  Detected     COVID-19 AND OTHER RESPIRATORY VIRAL PCR TESTING    Narrative:     The following orders were created for panel order COVID-19 and Other Respiratory Viral PCR Tests.  Procedure                               Abnormality         Status                     ---------                               -----------         ------                     SARS-CoV-2 and Influenza.Marland KitchenMarland Kitchen[[id number]]  Normal              Final result                 Please view results for these tests on the individual orders.   CBC W/DIFF    Narrative:     The following orders were created for panel order CBC and differential.  Procedure                               Abnormality         Status                     ---------                               -----------         ------  CBC w/ Differential[[id number]]          Abnormal            Final result                 Please view results for these tests on the individual orders.   URINE TESTING (UA, UARC, URINE CULTURE)    Narrative:     The following orders were created for panel order Urine Testing (UA, UARC, Urine Culture).  Procedure                               Abnormality         Status                     ---------                               -----------         ------                     Urinalysis reflex to cul.Marland KitchenMarland Kitchen[[id number]]  Abnormal            Final result                 Please view results for these tests on the individual orders.       Glasgow Coma Scale Score: 15      ED Course & MDM        Medical Decision Making  Pt with multiple falls, weakness recently.  She had fallen again today. There is no sig change in the right humerus fx.  There is no change in the rib fractures.  There is pain in the right and left leg, seems to be ? Radicular from lateral to the anterior portion of the legs to the knees.  Xr both knees.  Right is the site of most discomfort. Could be fx osteophyte as the cause. Knee immobilizer placed.     H/o back sx.  H/o recent falls multiple  times.      Mri L spine given possible radicular pain, b/l leg weakness, multiple falls.     Amount and/or Complexity of Data Reviewed  Independent Historian:      Details: Pt's daughter.   Labs: ordered. Decision-making details documented in ED Course.  Radiology: ordered and independent interpretation performed. Decision-making details documented in ED Course.     Details: Xr b/l knees with right ? Osteophyte fx.     Xr shoulder with prox humerus fx. Similar to prior.   ECG/medicine tests: ordered and independent interpretation performed. Decision-making details documented in ED Course.    Risk  Prescription drug management.  Decision regarding hospitalization.                            Mathis Dad, MD  09/18/23 [phone]

## 2023-09-17 NOTE — ED Notes (Signed)
Pt sat% dropped to 88% on RA while resting on stretcher. Airway patent, pt speaking in full sentences, denies hx of sleep apnea. Placed on 2L NC, tolerating well, sat% 96% at this time. Continuing to monitor.      Davene Costain, RN  09/17/23 2322

## 2023-09-17 NOTE — H&P (Addendum)
ADMISSION H&P      Today's Date: 09/17/2023  MRN: [id number]  Name: Molly Jensen  DOB: Jul 18, 1951    Chief Complaint    Falls    History Of Present Illness    Molly Jensen is a 73 y.o. female with a PMH of diabetes, hypertension, neuropathy and recurrent syncope who was admitted due to falls.  The patient had 2 falls today.  She denied hitting her head or passing out.  She is not on anticoagulation.  Also reported to have decreased p.o. intake.  She was on the floor until 5:30 AM when her daughter comes to see her and that is when she was found.  She denied fever, chills, SOB, cough, abdominal pain, vomiting or diarrhea.  Surgical History  She has a past surgical history that includes Hysterectomy; Cholecystectomy; and CTA CHEST W AND WO CONTRAST (08/12/2023).     Social History  She reports that she has never smoked. She has never used smokeless tobacco. She reports that she does not drink alcohol and does not use drugs.    Family History  Family History   Problem Relation Name Age of Onset    No Known Problems Mother      No Known Problems Father          Advance Care Plan  Extended Emergency Contact Information  Primary Emergency Contact: Awanda Mink  Address: [address]           [place], [place] [address] [place]  Home Phone: [phone]  Mobile Phone: [phone]  Relation: Daughter  Preferred language: English  Interpreter needed? No        Allergies  Latex     Medications  (Not in a hospital admission)      Medications        Start Medication Dose/Rate, Route, Frequency Ordered Stop    09/18/23 0900 enoxaparin (Lovenox) syringe 40 mg         40 mg, subQ, Daily 09/17/23 2249 --    09/17/23 2255 sodium chloride 0.9 % infusion         100 mL/hr, IV, Continuous 09/17/23 2250 09/18/23 1054    09/17/23 2251 HYDROmorphone (Dilaudid) injection 0.2 mg         0.2 mg, IV, Every 4 hours PRN 09/17/23 2251 09/24/23 2250    09/17/23 2249 melatonin tablet 6 mg         6 mg, oral, Nightly PRN 09/17/23  2249 --    09/17/23 2249 ondansetron ODT (Zofran-ODT) disintegrating tablet 4 mg        Placed in "Or" Linked Group    4 mg, oral, Every 8 hours PRN 09/17/23 2249 --    09/17/23 2249 ondansetron (Zofran) injection 4 mg        Placed in "Or" Linked Group    4 mg, IV, Every 8 hours PRN 09/17/23 2249 --    09/17/23 2249 acetaminophen (Tylenol) tablet 650 mg        Placed in "Or" Linked Group    650 mg, oral, Every 6 hours PRN 09/17/23 2249 --    09/17/23 2249 acetaminophen (Tylenol) solution 650 mg        Placed in "Or" Linked Group    650 mg, oral, Every 6 hours PRN 09/17/23 2249 --    09/17/23 2249 acetaminophen (Tylenol) suppository 650 mg        Placed in "Or" Linked Group    650 mg, rect, Every 6 hours PRN 09/17/23  2249 --                    Review of Systems  Negative except as mentioned in the HPI     Objective   Physical Exam    General: AOX3, in no acute distress  Respiratory: clear to auscultation bilaterally  Cardiovascular: S1, S2 normal, no murmurs  Gastrointestinal: Non distended, normal bowel sounds, soft, non tender  Extremities : no edema  Psychiatric: Cooperative, appropriate mood & affect     Last Recorded Vitals  Blood pressure 127/75, pulse 75, temperature 35.7 C (96.2 F), temperature source Tympanic, resp. rate 20, height 1.575 m, weight 68.9 kg, SpO2 (!) 93%.     Assessment/Plan       [name] is a 73 y.o. female with a PMH of diabetes, hypertension, neuropathy and recurrent syncope who was admitted due to falls.     #Falls  -CT head, CT cervical spine were unremarkable  -PT eval when able      #Knee effusion  #Fracture in the right proximal humeral surgical neck.   #Rib fractures  - knee X-ray showing    Large joint effusion.    Findings suspicious for fractured osteophyte along superior patella    -Pain control  -Orthopedic consult      #Elevated CK  -Monitor CK and renal function  -IV fluids      The patient's CT lumbar spine was normal but she had a history of back surgery according to  the ER and the patient keeps falling and so they ordered MRI lumbar spine      Admit to med surg  Code status: full code  DVT prophylaxis     I certify that hospital inpatient services are reasonable and medically necessary. They are appropriately provided as inpatient services in accordance with the 2 midnight benchmark under 42 CFR 412 3(e), or the services are specified as inpatient only procedure under 42 CFR 419 22 (n).         Baron Sane, MD

## 2023-09-17 NOTE — ED Triage Notes (Signed)
Report called in by NP [facility] after Tele Med (pt not seen in Office) w/ pt/daughter, pt already en route POV from home to [facility] ED, Recs: Labs & Imaging.  "Pt w/ recent [facility] Admit. Pt w/ 2 unwitnessed falls this am, no headstrike, min AMS x 1 wk, decreased intake, no Thinners".

## 2023-09-17 NOTE — ED Triage Notes (Addendum)
Elderly female comes in from home by EMS with several falls. Fell last night and again this morning,. Hx multiple fall.s hx fx right shoulder on 09/12/23 and fx ribs right side 08/21/23. Legs weak and just " give out". Alert and oriented.  C/o pain in knees today. Patient stayed on floor last night until 530 am today when daughter came over to see patient. No loc.  Has sling and swath to right arm.

## 2023-09-18 ENCOUNTER — Observation Stay: Admit: 2023-09-18 | Payer: MEDICARE

## 2023-09-18 ENCOUNTER — Inpatient Hospital Stay: Admit: 2023-09-18

## 2023-09-18 DIAGNOSIS — W19XXXA Unspecified fall, initial encounter: Secondary | ICD-10-CM

## 2023-09-18 LAB — CBC WITH DIFFERENTIAL
Basophils %: 0.7 %
Basophils Absolute: 0.05 10*3/uL (ref 0.00–0.22)
Eosinophils %: 11.9 %
Eosinophils Absolute: 0.85 10*3/uL — ABNORMAL HIGH (ref 0.00–0.50)
Hematocrit: 37.3 % (ref 32.0–47.0)
Hemoglobin: 12 g/dL (ref 11.0–16.0)
Immature Granulocytes %: 0.1 %
Immature Granulocytes Absolute: 0.01 10*3/uL (ref 0.00–0.10)
Lymphocyte %: 27.4 %
Lymphocytes Absolute: 1.96 10*3/uL (ref 0.70–4.00)
MCH: 29.4 pg (ref 26.0–34.0)
MCHC: 32.2 g/dL (ref 31.0–37.0)
MCV: 91.4 fL (ref 80.0–100.0)
MPV: 9.1 fL (ref 9.1–12.4)
Monocytes %: 7 %
Monocytes Absolute: 0.5 10*3/uL (ref 0.36–0.77)
NRBC %: 0 % (ref 0.0–0.0)
NRBC Absolute: 0 10*3/uL (ref 0.00–2.00)
Neutrophil %: 52.9 %
Neutrophils Absolute: 3.79 10*3/uL (ref 1.50–7.95)
Platelets: 278 10*3/uL (ref 150–400)
RBC: 4.08 M/uL (ref 3.70–5.20)
RDW-CV: 15.1 % — ABNORMAL HIGH (ref 11.5–14.5)
RDW-SD: 50.6 fL (ref 35.0–51.0)
WBC: 7.2 10*3/uL (ref 4.0–11.0)

## 2023-09-18 LAB — URINALYSIS REFLEX TO CULTURE
Bacteria, Ur: NONE SEEN
Bilirubin, Ur: NEGATIVE
Blood, Ur: NEGATIVE
Casts, Ur: 0 /LPF (ref 0–4)
Epithelial Cells, UR: 4 {cells}/[HPF] (ref 0–5)
Glucose,Ur: NEGATIVE mg/dL
Ketones, Ur: NEGATIVE mg/dL
Nitrite, Ur: NEGATIVE
Protein,Ur: NEGATIVE mg/dL
RBC, Ur: 0 {cells}/[HPF] (ref 0–4)
Specific Gravity, Ur: 1.015 (ref 1.005–1.030)
Urobilinogen, Ur: 0.2 U/dL (ref 0.2–1.0)
WBC, Ur: 4 {cells}/[HPF] (ref 0–5)
pH, Ur: 5 (ref 5.0–8.0)

## 2023-09-18 LAB — COMPREHENSIVE METABOLIC PANEL
ALT: 20 U/L (ref 0–55)
AST: 25 U/L (ref 6–42)
Albumin: 3.2 g/dL (ref 3.2–5.0)
Alkaline phosphatase: 105 U/L (ref 30–130)
Anion Gap: 5 mmol/L (ref 3–14)
BUN: 17 mg/dL (ref 6–24)
Bilirubin, total: 0.6 mg/dL (ref 0.2–1.2)
CO2 (Bicarbonate): 25 mmol/L (ref 20–32)
Calcium: 8.1 mg/dL — ABNORMAL LOW (ref 8.5–10.5)
Chloride: 112 mmol/L — ABNORMAL HIGH (ref 98–110)
Creatinine: 0.89 mg/dL (ref 0.55–1.30)
Glucose: 98 mg/dL (ref 70–110)
Potassium: 3.9 mmol/L (ref 3.6–5.2)
Protein, total: 6.5 g/dL (ref 6.0–8.4)
Sodium: 142 mmol/L (ref 135–146)
eGFRcr: 69 mL/min/{1.73_m2} (ref >=60)

## 2023-09-18 LAB — VITAMIN B12: Vitamin B-12: 544 pg/mL (ref 193–986)

## 2023-09-18 LAB — CK: CK: 309 U/L — ABNORMAL HIGH (ref 30.0–200.0)

## 2023-09-18 LAB — MAGNESIUM: Magnesium: 1.8 mg/dL (ref 1.6–2.6)

## 2023-09-18 MED ORDER — baclofen (Lioresal) tablet 5 mg
10 | Freq: Three times a day (TID) | ORAL | Status: DC | PRN
Start: 2023-09-18 — End: 2023-09-20
  Administered 2023-09-18: 23:00:00 5 mg via ORAL

## 2023-09-18 MED ORDER — lisinopril tablet 10 mg
5 | Freq: Every day | ORAL | Status: DC
Start: 2023-09-18 — End: 2023-09-20
  Administered 2023-09-19 – 2023-09-20 (×2): 10 mg via ORAL

## 2023-09-18 MED ORDER — morphine injection 2 mg
2 | Freq: Once | INTRAMUSCULAR | Status: AC
Start: 2023-09-18 — End: 2023-09-18
  Administered 2023-09-18: 15:00:00 2 mg via INTRAVENOUS

## 2023-09-18 MED ORDER — influenza trivalent, ADJUVANTED, 2024-2025 high risk vaccine 1 Dose
45 | Freq: Once | INTRAMUSCULAR | Status: AC
Start: 2023-09-18 — End: 2023-09-19
  Administered 2023-09-19: 18:00:00 1 via INTRAMUSCULAR

## 2023-09-18 MED ORDER — morphine injection 2 mg
2 | INTRAMUSCULAR | Status: DC | PRN
Start: 2023-09-18 — End: 2023-09-20
  Administered 2023-09-18 – 2023-09-20 (×8): 2 mg via INTRAVENOUS

## 2023-09-18 MED ORDER — tolterodine (Detrol) tablet 1 mg
1 | Freq: Two times a day (BID) | ORAL | Status: DC
Start: 2023-09-18 — End: 2023-09-20
  Administered 2023-09-19 – 2023-09-20 (×4): 1 mg via ORAL

## 2023-09-18 MED ORDER — magnesium sulfate in D5W IVPB 1 g
1 | Freq: Once | INTRAVENOUS | Status: AC
Start: 2023-09-18 — End: 2023-09-18
  Administered 2023-09-18: 17:00:00 1 g via INTRAVENOUS

## 2023-09-18 MED ORDER — gabapentin (Neurontin) capsule 100 mg
100 | Freq: Every evening | ORAL | Status: DC
Start: 2023-09-18 — End: 2023-09-20
  Administered 2023-09-19 – 2023-09-20 (×2): 100 mg via ORAL

## 2023-09-18 MED ORDER — dexAMETHasone (Decadron) tablet 4 mg
4 | Freq: Three times a day (TID) | ORAL | Status: DC
Start: 2023-09-18 — End: 2023-09-20
  Administered 2023-09-19 – 2023-09-20 (×5): 4 mg via ORAL

## 2023-09-18 MED ORDER — citalopram (CeleXA) tablet 20 mg
20 | Freq: Every day | ORAL | Status: DC
Start: 2023-09-18 — End: 2023-09-20
  Administered 2023-09-19 – 2023-09-20 (×2): 20 mg via ORAL

## 2023-09-18 MED ORDER — lidocaine 4 % patch 1 patch
4 | Freq: Every day | TOPICAL | Status: DC
Start: 2023-09-18 — End: 2023-09-20
  Administered 2023-09-18 – 2023-09-20 (×3): 1 via TOPICAL

## 2023-09-18 MED ORDER — pantoprazole (ProtoNix) EC tablet 40 mg
40 | Freq: Every day | ORAL | Status: DC
Start: 2023-09-18 — End: 2023-09-20
  Administered 2023-09-19 – 2023-09-20 (×2): 40 mg via ORAL

## 2023-09-18 MED FILL — BACLOFEN 10 MG TABLET: 10 10 mg | ORAL | Qty: 1

## 2023-09-18 MED FILL — HYDROMORPHONE 0.5 MG/0.5 ML INJECTION WRAPPER: 0.5 0.5 mg/0.5 mL | INTRAMUSCULAR | Qty: 0.5

## 2023-09-18 MED FILL — LIDOCAINE 4 % TOPICAL PATCH: 4 4 % | TOPICAL | Qty: 1

## 2023-09-18 MED FILL — MAGNESIUM SULFATE 1 GRAM/100 ML IN DEXTROSE 5 % INTRAVENOUS PIGGYBACK: 1 1 gram/100 mL | INTRAVENOUS | Qty: 100

## 2023-09-18 MED FILL — ENOXAPARIN 40 MG/0.4 ML SUBCUTANEOUS SYRINGE: 40 40 mg/0.4 mL | SUBCUTANEOUS | Qty: 0.4

## 2023-09-18 MED FILL — MORPHINE 2 MG/ML INJECTION WRAPPER: 2 2 mg/mL | INTRAMUSCULAR | Qty: 1

## 2023-09-18 NOTE — Care Plan (Signed)
Goal Outcome Evaluation:      Plan of care review with patient  Progression on going  Goal outcome evaluation on going  Patient with mult injury from fall, being follow by orth , going to need rehab  Plan rest and pain management

## 2023-09-18 NOTE — Consults (Signed)
Physical Therapy Evaluation    Patient Name: Molly Jensen  MRN: [id number]  DOB: 1950/09/17  Evaluation Date: 09/18/2023    Subjective   HPI/Hospital Course:   Pt is a 73 yo Female with PMH of DM, HTN, neuropathy, and recurrent falls presenting to hospital for recurrent fall at home. W/u signficant for acute R superior patella fx. Of note, pt with multiple recent falls; one requiring admission to [facility] 08/21/23 for acute R sided rib fxs, sent home independently; one presenting to PCPs office with R humeral fx, sent home in sling. PT consulted for mobility assessment and dispo planning.    Patient Active Problem List   Diagnosis    Primary osteoarthritis of right knee    Primary osteoarthritis of left knee    Influenza B    Syncope and collapse    Syncope, unspecified syncope type    Multiple falls       Past Medical History:   Diagnosis Date    Degenerative joint disease     Hypertension        Past Surgical History:   Procedure Laterality Date    CHOLECYSTECTOMY      CTA CHEST W AND WO CONTRAST  08/12/2023    CTA CHEST W AND WO CONTRAST 08/12/2023 [facility] CT    HYSTERECTOMY         Objective     General Information  General Info  PT Received On: 09/18/23  Following Therapy Session:: Call bell within reach, Patient in Bed, Nursing Staff Aware of Patient Location, Family/visitor Present (on ED stretcher)  Plan of Care Reviewed With:: Patient, Case Manager, RN/Charge Nurse, OT, Family  Recommended mobility with nursing staff: x2A for bed mob  Eval Information  Type of Evaluation: Initial Evaluation    Precautions  Weight Bearing Status-1: RUE, Non-Weight Bearing  Weight Bearing Status-2: RLE, Non-Weight Bearing (presumed until ortho c/s)  Other Precautions: Fall Risk  Activity Restrictions: R rib fxs    Home Living  Was Patient Admitted from STR?: No  Lives With: Alone  Type of Home: House  Home Layout: Two Level, Performs ADL's on One Level, Able to Live on One Level with Bedroom/Bathroom  Number of Stairs to Enter Home:  1  Number of Stairs Within Home: 1 FOS  Bathroom Shower/Tub: Medical sales representative: Standard  Home Equipment: Agricultural consultant, Single [employer]    Prior Level of Function  Information Provided By: Patient, Children, EMR  Independent at Baseline: All Household Mobility, All Community Mobility, All ADL's, All ADL's/IADL's, Driving, Shopping, Without Device  History of Recent Falls: Yes (multi falls in past month)  Work Status: Retired (former Naval architect)  Comments: prior to 1/1 admission, pt was indep without AD and active with OP PT. Pt was d/c'd home from that admission at indep level. Pt sustained an additional fall 1/17, went to PCP, found to have broken R humerus, and sent home with a sling and minimal-no edu on NWB or self care mgmt.    Pain  Pain Assessment: 0-10 (and R shoulder)  Pain Score: 10 - Worst possible pain  Pain Type: Acute pain  Pain Location: Knee  Pain Orientation: Right, Left  Pain Management Interventions: care clustered, position adjusted, pillow support provided, cold applied (RN notified re: due for pain meds?)       Vital Signs  Vital Signs  Pulse: 76  Heart Rate Source: Monitor  BP: (!) 182/75  BP Location: Left arm  BP Method: Automatic  Patient Position: Lying  Oxygen Therapy  SpO2: 97 %  Pulse Oximetry Type: Intermittent  Patient Activity During SpO2 Measurement: At rest  Oxygen Therapy: None (Room air)  RN aware of elevated BP    Cognition  Orientation Level: Oriented X4  Following Commands: Follows all commands and directions without difficulty  Safety Judgment: Appropriate Safety awareness  Communication: Not Impaired  Behavioral/Affect Comments: cooperative, tearful/frustrated with injuries and pain    Integumentary  Integumentary: bilat knee swelling R>L, R knee warm to touch, multiple abrasions to RLE, dressing on R patella in place, KI in place (ill-fitting and  readjusted for proper fit),  RUE in sling, + UE PIV  LDA Integrity: Intact Pre and Post  Therapy    ROM/Strength  PROM Right Lower Extremity  Overall RLE PROM: Other (Comment) (ankle and hip WFL, knee not assessed as in KI 2/2 patella fx)  PROM Left Lower Extremity  Overall LLE PROM: [facility]  Strength Right Lower Extremity  RLE Overall Strength: Other (Comment) (ankle and hip WFL, knee not assessed as in KI 2/2 patella fx)  Strength Left Lower Extremity  LLE Overall Strength: WFL    Neuromuscular Assessment  Coordination: WFL  Sensation: Comments (baseline LE neuropathy, pt states decreased sensation)  Tone: Comments (no abnormal tone in LE noted)  Vision: Comments (denies acute visual changes)       Mobility Assessment  Bed Position: HOB elevated, Use of bedrail  Supine to Sit: Minimal Assist  Sit to Supine: Maximal Assist  Scooting: Minimal Assist  Bed Mobility Comments: cues for hand placement, sequencing, safety, NWB RUE,breathing techniques for pain control    Transfers Comments: unsafe to attempt with NWB RUE, NWB RLE, on high ED stretcher, pt's LEs unable to touch floor with pt at edge of stretcher      [employer] Balance Scale: Yes  KU Sitting Balance Scale: 2+ -  Independently supports self with one upper extremity.  KU Standing Balance Scale: 0 - Performs 25% or less of standing activity (maximum assist).    AMPAC - (Basic Mobility Inpatient Short Form) How much help from another person do you currently need?  Turning from your back to your side while in a flat bed without using bedrails?: 3 - A little  Moving from lying on your back to sitting on the side of a flat bed without using bedrails?: 3 - A little  Moving to and from a bed to a chair (including a wheelchair)?: 2 - A Lot  Standing up from a chair using your arms (e.g., wheelchair, or bedside chair)?: 2 - A Lot  To walk in hospital room?: 1 - Total  Climbing 3-5 steps with a railing?: 1 - Total  Raw Score: 12         Education  Education Provided: Role of PT/Plan of Care, Discharge Planning, Adaptive Equipment/Assistive Device,  Development worker, community, Safety Awareness/Fall Risk, Precautions, Weight Bearing Status, Activities Guidelines, Splinting/Bracing/Orthotics  Education Provided to: Patient, Daughter  Teaching Method: Demonstration, Discussion  Barriers to learning: None evident  Learning Evaluation: Verbalizes understanding, Needs reinforcement/further teaching, Needs practice      Assessment   Pt is a 72 yo Female with PMH as above who presents to physical therapy for recurrent falls and knee pain. Pt is functioning well below baseline functional status and is limited by below impairments iso acute knee pain and altered WB statuses. Pt scored 12/24 on the Mobility AMPAC scale, indicating a 61.94% functional disability and score consistent with d/c  location of rehab. Pt will benefit from d/c to INPT REHAB pending medical clearance to maximize LOF and further address skilled multidisciplinary needs. Pt would benefit from formal ortho c/s to further address knee injury. Pt would also benefit from neuro consult re: recurrent falls and LE neuropathy. Acute PT will cont to follow while here.    Patient presents with Impaired gait, Functional Mobility deficit, Impaired activity tolerance, Pain, Balance deficit, Strength deficit       Prognosis: Good       Goals   Short Term Goals  Date Established/Amended: 09/18/23  Goal timeframe: 2 weeks  Bed mobility goal: pt will perform bed mobility independently  Transfers goal: Pt will perform all transfers with LRAD and mod-I  Ambulation goal: Pt will ambulate >/= 138ft with LRAD and mod-I  Stairs goal: Pt will negotiate FOS with LRAD, handrail, S  Problem specific goal 1: Pt will recall WB precautions       Plan   Plan: Continued Skilled Inpatient PT Services   Treatment Interventions: Therapeutic exercise, Assistive device training, Functional mobility training, Gait training, Stair training, Patient/family education, Neuromuscular Re-education/balance  PT Frequency and Duration: 3-5 x/week until  Discharge      Recommendations  Anticipate Discharge to: Rehab  Recommended mobility with nursing staff: x2A for bed mob    [place] Nikolai Wilczak, PT, DPT  [place] lic # [id number]

## 2023-09-18 NOTE — ED Notes (Addendum)
Report given by RN Perez-Delgado. Upon assuming care, patient was resting in bed with the call light within reach. VSS and patient reports 9 out of 10 pain to the right shoulder, PRN Dilaudid given per [facility]. On pain reassessment, patient still reported 9 out of 10 pain to the right shoulder, MD Greggory Stallion made aware. PRN morphine given with positive affects. Patient's daughter at bedside asking for an update, MD Greggory Stallion came to the bedside to address concerns/update on patient's plan. Patient and patient's daughter agreeable to plan and patient left resting comfortably with the call light within reach and bed in lowest position.      Daneil Dolin, RN  09/18/23 1101       Daneil Dolin, RN  09/18/23 1101

## 2023-09-18 NOTE — Consults (Signed)
 Occupational Therapy Evaluation     Patient Name: Molly Jensen  MRN: [id number]  DOB: Jun 05, 1951  Today's Date: 09/18/2023    Subjective   HPI/Hospital Course: Pt is a 73 yo Female with PMH of DM, HTN, neuropathy, and recurrent falls presenting to hospital for recurrent fall at home. W/u signficant for acute R superior patella fx. Of note, pt with multiple recent falls; one requiring admission to [facility] 08/21/23 for acute R sided rib fxs, sent home independently; one presenting to PCPs office with R humeral fx, sent home in sling. OT consult received, RN clear pt for OT eval.    Patient Active Problem List   Diagnosis    Primary osteoarthritis of right knee    Primary osteoarthritis of left knee    Influenza B    Syncope and collapse    Syncope, unspecified syncope type    Multiple falls    Fall, initial encounter       Past Medical History:   Diagnosis Date    Degenerative joint disease     Hypertension        Past Surgical History:   Procedure Laterality Date    CHOLECYSTECTOMY      CTA CHEST W AND WO CONTRAST  08/12/2023    CTA CHEST W AND WO CONTRAST 08/12/2023 [facility] CT    HYSTERECTOMY         Objective   General Information  General Info  OT Received On: 09/18/23  Following Therapy Session:: Call bell within reach, Patient in Bed, Nursing Staff Aware of Patient Location, Family/visitor Present (ED stretcher, dtr present)  Plan of Care Reviewed With:: Patient, Case Manager, RN/Charge Nurse, OT, Family  Recommended mobility with nursing staff: x2A for bed mob  Eval Information  Type of Evaluation: Initial Evaluation    Precautions  Precautions  Weight Bearing Status-1: RUE, Non-Weight Bearing  Weight Bearing Status-2: RLE, Weight Bearing as Tolerated  Other Precautions: Fall Risk, Brace/Orthotics  Activity Restrictions: R rib fxs, RUE in sling and swathe, NWB, RLE in KI , WBAT per ortho.    Home Living  Was Patient Admitted from STR?: No  Lives With: Alone  Type of Home: House  Home Layout: Two Level, Performs ADL's on  One Level, Able to Live on One Level with Bedroom/Bathroom  Number of Stairs to Enter Home: 1  Number of Stairs Within Home: 1 FOS  Bathroom Shower/Tub: Medical sales representative: Standard  Home Equipment: Agricultural consultant, Single [employer]    Prior Level of Function  Information Provided By: Patient, Children, EMR  Dominance: Right  Independent at Baseline: All Household Mobility, All Community Mobility, All ADL's, All ADL's/IADL's, Driving, Shopping, Without Device  History of Recent Falls: Yes (multiple falls in past month)  Work Status: Retired  Comments: prior to 1/1 admission, pt was indep without AD and active with OP PT. Pt was d/c'd home from that admission at indep level. Pt sustained an additional fall 1/17, went to PCP, found to have broken R humerus, and sent home with a sling and minimal-no edu on NWB or self care mgmt. Per daughter, pt was primarily remaining in bed since R humerus fx / fall 1/17.    Pain  Pain Assessment  Pain Assessment: 0-10  Pain Score: 10 - Worst possible pain  Pain Type: Acute pain  Pain Location: Knee  Pain Orientation: Right, Left  Pain Management Interventions: care clustered, position adjusted, pillow support provided, cold applied    Vital Signs  Vital Signs  BP: (!) 182/75  BP Location: Left arm  BP Method: Automatic  Patient Position: Lying  Oxygen Therapy  SpO2: (!) 7 %  Pulse Oximetry Type: Intermittent  Patient Activity During SpO2 Measurement: At rest  Oxygen Therapy: None (Room air)    Cognition  Cognition  Overall Cognitive Status: Within Functional Limits  Orientation Level: Oriented X4  Following Commands: Follows all commands and directions without difficulty  Safety Judgment: Appropriate Safety awareness  Communication: Not Impaired  Behavioral/Affect Comments: cooperative, tearful/frustrated with injuries and pain       Integumentary  Integumentary  Integumentary: bilat knee swelling R>L, R knee warm to touch, multiple abrasions to RLE, dressing on R  patella in place, KI in place (ill-fitting and  readjusted for proper fit),  RUE in sling, + UE PIV  LDA Integrity: Intact Pre and Post Therapy    ROM/Strength  PROM Right Upper Extremity  Overall RUE PROM: Not Tested  PROM Left Upper Extremity  Overall LUE PROM: WFL  PROM Right Lower Extremity  Overall RLE PROM: Not Tested  PROM Left Lower Extremity  Overall LLE PROM: WFL  Strength Right Upper Extremity  RUE Overall Strength: Not Tested  Strength Left Upper Extremity  LUE Overall Strength: WFL              Neuromuscular Assessment  Neuromuscular Assessment  Auditory: WFL  Vestibular: WFL  Coordination: WFL  Sensation: Comments (baseline LE neuropathy reported)  Vision: [facility]         Functional Mobility Assessment  Bed Position: HOB elevated, Use of bedrail  Supine to Sit: Minimal Assist  Sit to Supine: Maximal Assist  Scooting: Minimal Assist  Bed Mobility Comments: cues for hand placement, attempts to use RUE when sitting upright in long sit, educ on NWB RUE    Transfers Comments: unsafe to attempt this date. pt in 10/10 pain, tearful and fearful EOB. Attempted to scoot but req incr assist.         Sitting Balance - Static: Good EOB  Sitting Balance - Dynamic: GOod EOB  Standing Balance - Static: NT  Standing Balance - Dynamic: NT    ADL's  Feeding: Setup, Modified Independent (pt is R hand dominant, req set up assist with self-feeding this date and use of LUE)  Upper Body Dressing: Maximum Assist (managing sling. Educ pt on sling management/ proper wear of sling with verbalized understanding.)  Lower Body Dressing: Maximum Assist (sock management, pt unable to manage socks this date.)  ADL Comments: Pt req incr assist with all ADLS 2/2 RUE NWB/pain, RLE pain and overall decr activity tolerance/strength/endurance.  AMPAC - (Daily Activity Inpatient Short Form) How much help from another person do you currently need?  Putting on and taking off regular lower body clothing?: 2- A Lot  Bathing (including washing,  rinsing, drying)?: 2- A Lot  Toileting, which includes using toilet,bedpan or urinal?: 2- A Lot  Putting on and taking off regular upper body clothing?: 2- A Lot  Taking care of personal grooming such as brushing teeth?: 2- A Lot  Eating meals?: 2- A Lot  Raw Score: 12      Education  Education  Education Provided: Role of OT/Plan of Care  Education Provided to: Patient, Daughter  Teaching Method: Demonstration, Discussion  Learning Evaluation: Verbalizes understanding, Needs reinforcement/further teaching, Needs practice    Assessment   Pt is a 73 yo Female with PMH of DM, HTN, neuropathy, and recurrent falls presenting to hospital for recurrent  fall at home. W/u signficant for acute R superior patella fx. Of note, pt with multiple recent falls; one requiring [facility] 08/21/23 for acute R sided rib fxs, sent home independently; one presenting to PCPs office with R humeral fx, sent home in sling. Pt seen for OT eval and presents well below baseline with ADLs/IADLS/functional mobility. Pt is NWB RUE and WBAT with RLE and is most limited by intense pain. Pt req max A iwth all ADLS at this time, and is req incr assist with bed mobility. Pt unable to safely stand this date. She reports incr pain in RLE and LLE, reporting shooting pain and numbness in her LE's, which she attributes to her falls/weakness at home. At this time, strongly recommend discharge to acute rehab to maximize functional independence. Pt is motivated to return to her baseline PLOF of independent.     Patient presents with ADL/Self care deficit, IADL deficit, Functional Mobility deficit, Strength deficit, Impaired activity tolerance, Impaired safety awareness, Balance deficit, Pain, Range of motion deficit, Impaired sensation       Prognosis: Good       Goals   Short Term Goals  Date Established/Amended: 09/18/23  Goal timeframe: 1 week  Goal 1: Pt will participate in commode transfer assessment  Goal 2: Pt will complete sling management with  min A  Goal 3: Pt will complete UB dressing using 1 handed techniqe with min A  Goal 4: Pt will complete bed <> chair transfer assessment    Long Term Goals              Plan   Plan: Continued skilled Inpatient OT services   Treatment Interventions: ADLs/Self Care Training, Patient/Caregiver Education, Functional Mobility, Bed Mobility, Teacher, early years/pre, Assistive Device Training, Engineer, civil (consulting), Interior and spatial designer, Insurance claims handler, Naval architect, Energy Conservation  OT Frequency and Duration: 2-3 x/week until Discharge  Treatment:      Recommendations  Anticipate Discharge to: Acute Rehab  Recommended mobility with nursing staff: x2A for bed mob    Roetta Sessions, OT  [place] lic # [id number]

## 2023-09-18 NOTE — Significant Event (Addendum)
Patient arrived to room 314 in stable condition. Cardiac monitor applied. Admission assessment and data completed. Safety maintained, oriented to call bell and able to verbalize understanding. Patient sent to MRI for lumbar imaging, returned without incident. Patient reported 8/10 right knee pain, medicated with morphine IVP and baclofen for spasm, effect pending, will endorse for follow-up. Daughter called this shift and updated on patient's condition. 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 At end of shift, patient in bed, no distress noted, eating dinner without incident. Call bell in place, bed alarm on and functioning. Dr.George notified and aware of MRI results, ordered neurosurgery consult, patient made aware of consult.

## 2023-09-18 NOTE — Progress Notes (Signed)
If you are the patient referred to in this chart and reviewing the medical notes, please note that medical documentation is often written with abbreviations in medical terminology.  Physician documentation is typically written by physicians for other physicians to review in order to document what happened, tests that were ordered/interpreted and the diagnosis in the most efficient way possible.  These notes are made available for patients to review but are not specifically written with patient consumption in mind.        HOSPITALIST PROGRESS NOTE      Name: Molly Jensen  MRN: [id number]  Hospital Day: Hospital Day: 2    SUBJECTIVE:   Complains of severe right leg pain, right knee is in a brace  She tells me that she has had recurrent falls, feels weak in her legs, feels like her legs give way and she falls      REVIEW OF SYSTEM     A 12 point review of systems was performed and found to be negative except for what is documented in the progress note.    VITAL SIGNS:   Current Vital Signs with 24 Hour Ranges:    Visit Vitals  BP (!) 170/89 (BP Location: Left arm, Patient Position: Semi-Fowler)   Pulse 90   Temp 36.8 C (98.2 F) (Oral)   Resp 18      Oxygen Therapy  SpO2: (!) 94 %  Patient Activity During SpO2 Measurement: At rest  Oxygen Therapy: None (Room air)  O2 Delivery Method: Nasal cannula  O2 Flow Rate (L/min): 2 L/min     Intake/Output Summary (Last 24 hours) at 09/18/2023 1950  Last data filed at 09/18/2023 1300  Gross per 24 hour   Intake --   Output 150 ml   Net -150 ml        PHYSICAL EXAMINATION:     General: Lying in bed, in mild distress  HEENT: Normocephalic, atraumatic, PERRLA, oral mucosa moist  Neck: Supple, normal ROM  Respiratory: CTAB, no wheezing or crackles, not in labored breathing  Cardiovascular: Normal rate and rhythm, no murmur  Gastrointestinal: Soft, nondistended, nontender, positive bowel sounds  Musculoskeletal: Right knee swollen with tenderness on palpation  Neurologic: AAOx3, CN II to  XII intact, unable to move right lower extremity [in severe pain], able to wiggle toes, reduced sensation over right leg, plantars downgoing on left, mute on right  Psychiatric: Calm, cooperative    LABS:     Results from last 7 days   Lab Units 09/18/23  0606 09/17/23  1319   WBC AUTO K/uL 7.2 10.8   HEMOGLOBIN g/dL 32.4 40.1   HEMATOCRIT % 37.3 39.4   PLATELETS AUTO K/uL 278 288         Results from last 7 days   Lab Units 09/18/23  0606 09/17/23  1308   SODIUM mmol/L 142 139   POTASSIUM mmol/L 3.9 3.8   CHLORIDE mmol/L 112* 109   CO2 mmol/L 25 27   BUN mg/dL 17 16   CREATININE mg/dL 0.27 2.53   ANION GAP mmol/L 5 3     Results from last 7 days   Lab Units 09/18/23  0606 09/17/23  1308   CALCIUM mg/dL 8.1* 8.8   MAGNESIUM mg/dL 1.8 1.9     Results from last 7 days   Lab Units 09/18/23  0606 09/17/23  1308   ALT U/L 20 21   AST U/L 25 25   ALK PHOS U/L 105 114   BILIRUBIN  TOTAL mg/dL 0.6 0.6           No lab exists for component: "LABPROT"  Lab Results   Component Value Date    TROPIHS 14 09/17/2023    TROPIHS 9 08/21/2023    TROPIHS 17 08/12/2023     Lab Results   Component Value Date    NTPROBNP 371 (H) 08/12/2023     Estimated Creatinine Clearance: 57.7 mL/min (by C-G formula based on SCr of 0.89 mg/dL).    MICROBIOLOGY:     Results       Procedure Component Value Units Date/Time    COVID-19 and Other Respiratory Viral PCR Tests [[id number]]  (Normal) Collected: 09/17/23 1808    Order Status: Completed Specimen: Mucosa from Nasopharyngeal Updated: 09/17/23 1853    Narrative:      The following orders were created for panel order COVID-19 and Other Respiratory Viral PCR Tests.  Procedure                               Abnormality         Status                     ---------                               -----------         ------                     SARS-CoV-2 and Influenza.Marland KitchenMarland Kitchen[[id number]]  Normal              Final result                 Please view results for these tests on the individual orders.              IMAGING:         MR LUMBAR SPINE WO CONTRAST   Final Result   1.  Severe spinal canal stenosis with cauda equina nerve root compression at L4-L5 secondary to anterolisthesis, ligamentum flavum thickening, and disc bulge with central protrusion.   2.  Additional severe left neural foraminal stenosis at L5-S1. Less pronounced areas of neural foraminal stenosis as above.      Florencia Reasons 09/18/2023 5:30 PM      CT KNEE RIGHT WO CONTRAST   Final Result   Probable acute fracture of the superior patellar spur with adjacent large suprapatellar joint effusion. The lucency has increased from prior radiograph dated 11/11/2021.      Subcutaneous soft tissue fat stranding anterior to the patella, likely due to edema.      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      Chondrocalcinosis of the knee joint.      Alexi Otrakji 09/18/2023 11:47 AM      XR KNEE LEFT 3 VIEWS   Final Result   No evidence of a fracture or dislocation.      Michael Litter, MD 09/17/2023 6:13 PM      XR KNEE RIGHT 3 VIEWS   Final Result      1. Large joint effusion.    2. Mild to moderate tricompartmental degenerative changes.   3. Findings suspicious for fractured osteophyte along superior patella. Correlation with pain symptoms is advised.         Delice Bison, MD 09/17/2023 6:04  PM      XR HUMERUS RIGHT   Final Result   Fracture in the right proximal humeral surgical neck.      Michael Litter, MD 09/17/2023 6:08 PM      XR RIBS RIGHT 2 VIEWS W CHEST   Final Result   Fractures in the right ninth and 10th ribs.   Fracture in the right proximal humerus.      Michael Litter, MD 09/17/2023 6:11 PM      XR SHOULDER RIGHT 2+ VIEWS   Final Result   Slight interval mild displacement of the known comminuted proximal humeral shaft.      Clarisse Gouge, MD 09/18/2023 1:59 PM      CT HEAD WO CONTRAST   Final Result      No acute territorial infarct, space-occupying lesion, or intracranial hemorrhage.      Florencia Reasons 09/17/2023 4:37 PM      CT CERVICAL SPINE WO  CONTRAST   Final Result      1.  No acute traumatic injury of the cervical spine.      Abigail Butts, MD 09/17/2023 4:40 PM      CT LUMBAR SPINE WO CONTRAST   Final Result      Mild degenerative changes without radiographically apparent acute findings.      Ardis Rowan, MD 09/17/2023 4:45 PM           CURRENT MEDICATIONS:     dexAMETHasone, 4 mg, oral, q8h  enoxaparin, 40 mg, subcutaneous, Daily  influenza, 1 Dose, intramuscular, Once  lidocaine, 1 patch, topical (top), Daily         PRN medications: acetaminophen **OR** acetaminophen **OR** acetaminophen, baclofen, melatonin, morphine, ondansetron ODT **OR** ondansetron     ASSESSMENT & PLAN:       Molly Jensen is a 73 y.o. 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  She has fractured her ribs, shoulder and hurt her knee during this fall.  She also mentions that at times she does not feel the ground beneath her, feels like she is walking on cotton wool.  Will send vitamin B12, TSH  -CT head, CT cervical spine were unremarkable  -PT eval when able, will most likely require rehab        #R Knee effusion   Knee x-ray showing Large joint effusion, Findings suspicious for fractured osteophyte along superior patella, CT right knee showing acute fracture of superior patellar spur with adjacent large suprapatellar joint effusion, increased from x-ray, severe tricompartmental  OA  Evaluated by orthopedics, she does not require any operative intervention, should be given knee immobilizer, WBAT, to follow-up with Dr. Dorna Bloom outpatient    # Closed fracture in the right proximal humeral surgical neck.  Was seen by orthopedics on 09/13/2023, sling and swath applied, patient advised on care of device    #  Rib fractures  -CT chest from 08/21/2023 showing fractures right posterolateral ninth and 10th ribs  Conservative management, incentive spirometry   -Pain control for above fractures with morphine 2 mg every 4 hourly as needed, patient was initially on Dilaudid which she felt did not help her pain    #Elevated CK  -Monitor CK and renal function  -IV fluids     DVT prophylaxis: Lovenox  GI prophylaxis: Diet  CODE STATUS: Full code    Disclaimer: This note was generated with Dragon [employer] voice recognition program.  Please excuse any errors which may have been overlooked during review.  Sometimes, these errors may affect the content or meaning of a given sentence.    Zara Council, MD  Hospitalist  [facility]  September 18, 2023

## 2023-09-18 NOTE — ED Notes (Signed)
Pt updated on diet status, plan for imaging and scheduled consults.  Pt requesting update on when she is going upstairs. Pt aware of boarding in ER.  Hospital bed offered for comfort and denied.      Beatriz Stallion, RN  09/18/23 1400

## 2023-09-18 NOTE — Consults (Signed)
Orthopedic consult note    Chief complaint: Right patellar spur fracture    History:  This is a 73 year old female with frequent falls pending neurology evaluation here in the hospital who presents after a fall earlier today where she injured her right knee.  She has had multiple falls in the recent past and is currently being treated for rib fractures right proximal humerus fracture, in addition to her current knee injury.  She has been following with Dr. Delories Heinz for her shoulder.  X-rays in the emergency room today revealed the above-mentioned injury and fracture of a superior patellar spur.  No fracture of the patellar body itself.    Exam:  Pleasant alert and oriented 73 year old female resting comfortably in bed.  She has a very superficial abrasion over the anterior knee and distal to the patella.  There is a mild joint effusion.  No erythema or ecchymosis.  She has generalized tenderness about the knee with no 1 specific structure being more tender than another.  The quad tendon is palpable and intact with no defect, as is the patellar tendon.  The extensor mechanism is intact.  She is able to flex and extend the knee from 0 to approximately 50 degrees of knee flexion with relative comfort.    BP (!) 182/75 (BP Location: Left arm, Patient Position: Lying)   Pulse 76   Temp 36.4 C (97.5 F) (Oral)   Resp 16   Ht 1.575 m   Wt 68.9 kg   SpO2 97%   BMI 27.80 kg/m     Assessment: Fracture of the superior patellar spur is identified on x-ray.    Plan:  The extensor mechanism is intact and this patient does not require any operative intervention.  However, given her frequent falls and pain in this right knee she should be in a knee immobilizer until otherwise cleared by physical therapy and to mobilize without it. WBAT.  She can follow-up with Dr. Dorna Bloom as an outpatient upon discharge.  Case discussed with Dr. Dorna Bloom and his team.

## 2023-09-18 NOTE — Progress Notes (Addendum)
Case Management Progress Note:     09/18/23 1504   Case Management Initial Assessment   Patient Discharged Before Able to Interview/Assess No   Source of Information Family   Type of Residence Multilevel Home   Lives With Alone   Discharge Services Anticipated at this Time Yes   Expected Discharge Needs VNA;SNF   Patient Information   Interpreter Needs None   Home Caregiver Family   Accompanied by Family   Support Systems Children/Family   Need PCP on Discharge No   Current Patient Responsibilities Personal Care;Housekeeping;Meal Prep;Shopping;Driving   PTA Functional Status   Is patient prescribed an anti-coagulant? No   Current Functional Status   Bathing Independent   Toileting Independent   Walking in Home Independent   Assistive Device Used None   Community Mobility Independent      Case Management Progress Note:   Spoke to the daughter Molly Jensen for initial screen. Pt lives alone. Indep with Ad[facility] and [facility] prior to injuries. Had multiple recent hospital visits/admissions. Has Rib fx from 08/21/23, Humerus fx from 09/12/23, had another fall now with Knee injury (no fracture). WBAT. PT recommending rehab.     This CM spoke with patient and daughter Molly Jensen at bedisde in [facility]. They are in agreement with rehab. Agreed to local referrals (both live in [place]). Pt asked that decisions on rehabs go through daughter Molly Jensen. This CM called later in shift to review bed offers with daughter, she accepts bed offer at [facility]. MD anticipates dc in 24-48 hours pending neuro consult. Patient had qualifying stay inpatient 1/2-08/26/23.   Molly Persia, RN  09/18/23  3:12 PM

## 2023-09-19 DIAGNOSIS — R296 Repeated falls: Secondary | ICD-10-CM

## 2023-09-19 LAB — COMPREHENSIVE METABOLIC PANEL
ALT: 22 U/L (ref 0–55)
AST: 20 U/L (ref 6–42)
Albumin: 3.3 g/dL (ref 3.2–5.0)
Alkaline phosphatase: 114 U/L (ref 30–130)
Anion Gap: 6 mmol/L (ref 3–14)
BUN: 13 mg/dL (ref 6–24)
Bilirubin, total: 0.5 mg/dL (ref 0.2–1.2)
CO2 (Bicarbonate): 24 mmol/L (ref 20–32)
Calcium: 9.2 mg/dL (ref 8.5–10.5)
Chloride: 110 mmol/L (ref 98–110)
Creatinine: 0.84 mg/dL (ref 0.55–1.30)
Glucose: 164 mg/dL — ABNORMAL HIGH (ref 70–110)
Potassium: 3.9 mmol/L (ref 3.6–5.2)
Protein, total: 7.6 g/dL (ref 6.0–8.4)
Sodium: 140 mmol/L (ref 135–146)
eGFRcr: 74 mL/min/{1.73_m2} (ref >=60)

## 2023-09-19 LAB — CBC
Hematocrit: 42.2 % (ref 32.0–47.0)
Hemoglobin: 13.7 g/dL (ref 11.0–16.0)
MCH: 29.3 pg (ref 26.0–34.0)
MCHC: 32.5 g/dL (ref 31.0–37.0)
MCV: 90.4 fL (ref 80.0–100.0)
MPV: 9.2 fL (ref 9.1–12.4)
NRBC %: 0 % (ref 0.0–0.0)
NRBC Absolute: 0 10*3/uL (ref 0.00–2.00)
Platelets: 296 10*3/uL (ref 150–400)
RBC: 4.67 M/uL (ref 3.70–5.20)
RDW-CV: 14.6 % — ABNORMAL HIGH (ref 11.5–14.5)
RDW-SD: 48.4 fL (ref 35.0–51.0)
WBC: 6.9 10*3/uL (ref 4.0–11.0)

## 2023-09-19 LAB — CK: CK: 194 U/L (ref 30.0–200.0)

## 2023-09-19 MED ORDER — docusate sodium (Colace) capsule 100 mg
100 | Freq: Two times a day (BID) | ORAL | Status: DC
Start: 2023-09-19 — End: 2023-09-20
  Administered 2023-09-20 (×2): 100 mg via ORAL

## 2023-09-19 MED ORDER — sennosides (Senokot) tablet 17.2 mg
8.6 | Freq: Every evening | ORAL | Status: DC
Start: 2023-09-19 — End: 2023-09-20
  Administered 2023-09-20: 02:00:00 17.2 via ORAL

## 2023-09-19 MED ORDER — hydrALAZINE (Apresoline) 20 mg/mL injection 5 mg
20 | Freq: Four times a day (QID) | INTRAMUSCULAR | Status: DC | PRN
Start: 2023-09-19 — End: 2023-09-20
  Administered 2023-09-20: 01:00:00 5 mg via INTRAVENOUS

## 2023-09-19 MED FILL — CITALOPRAM 20 MG TABLET: 20 20 mg | ORAL | Qty: 1

## 2023-09-19 MED FILL — LIDOCAINE 4 % TOPICAL PATCH: 4 4 % | TOPICAL | Qty: 1

## 2023-09-19 MED FILL — MORPHINE 2 MG/ML INJECTION WRAPPER: 2 2 mg/mL | INTRAMUSCULAR | Qty: 1

## 2023-09-19 MED FILL — TOLTERODINE 1 MG TABLET: 1 1 mg | ORAL | Qty: 1

## 2023-09-19 MED FILL — LISINOPRIL 5 MG TABLET: 5 5 mg | ORAL | Qty: 2

## 2023-09-19 MED FILL — DEXAMETHASONE 4 MG TABLET: 4 4 mg | ORAL | Qty: 1

## 2023-09-19 MED FILL — ENOXAPARIN 40 MG/0.4 ML SUBCUTANEOUS SYRINGE: 40 40 mg/0.4 mL | SUBCUTANEOUS | Qty: 0.4

## 2023-09-19 MED FILL — GABAPENTIN 100 MG CAPSULE: 100 100 mg | ORAL | Qty: 1

## 2023-09-19 MED FILL — ACETAMINOPHEN 325 MG TABLET: 325 325 mg | ORAL | Qty: 2

## 2023-09-19 MED FILL — PANTOPRAZOLE 40 MG TABLET,DELAYED RELEASE: 40 40 mg | ORAL | Qty: 1

## 2023-09-19 MED FILL — FLU VACC 2024-25(65YR UP)-MF59C(PF) 45 MCG(15 MCGX3)/0.5 ML IM SYRINGE: 45 45 mcg (15 mcg x 3)/0.5 mL | INTRAMUSCULAR | Qty: 0.5

## 2023-09-19 NOTE — Care Plan (Signed)
Physical Therapy Deferral Note      Patient Name: Molly Jensen  MRN: [id number]  DOB: 07-Mar-1951    Deferral Reason: Not Medically Appropriate    Pt seen yesterday in ED for PT evaluation. Pt seen by ortho for R knee injury, rec WBAT RLE with KI. Pt underwent MRI L spine: - Impression:  1.  Severe spinal canal stenosis with cauda equina nerve root compression at L4-L5 secondary to anterolisthesis, ligamentum flavum thickening, and disc bulge with central protrusion.  2.  Additional severe left neural foraminal stenosis at L5-S1. Less pronounced areas of neural foraminal stenosis as above.    Neuro consulted, rec'd w/u and NSGY opinion on lumbar spine findings. Given above findings and clinical presentation, PT will hold until NSGY consult to assess for surgery or conservative measures. Will need spinal WB and stability clarification prior to further PT intervention.    [place] Birdwell, PT, DPT  [place] lic # [id number]

## 2023-09-19 NOTE — Care Plan (Signed)
Goal Outcome Evaluation:     BP elevated.  PRN hydralazine ordered.  Tylenol and morphine given for pain.  Pt reports she doesn't want to take too much morphine, hesitant to take pain meds.   R arm in sling.  R knee immobilizer in place.  Lidoderm patch and Ice on.  OOB to commode with assist.  PT eval pending.  Neuro following. Neurosurgery consult pending.  Using IS at bedside.

## 2023-09-19 NOTE — Progress Notes (Signed)
If you are the patient referred to in this chart and reviewing the medical notes, please note that medical documentation is often written with abbreviations in medical terminology.  Physician documentation is typically written by physicians for other physicians to review in order to document what happened, tests that were ordered/interpreted and the diagnosis in the most efficient way possible.  These notes are made available for patients to review but are not specifically written with patient consumption in mind.        HOSPITALIST PROGRESS NOTE      Name: Molly Jensen  MRN: [id number]  Hospital Day: Hospital Day: 3    SUBJECTIVE:     Patient seen and examined at the bedside with daughter at bedside.  She says her pain is little better than yesterday.    REVIEW OF SYSTEM     A 12 point review of systems was performed and found to be negative except for what is documented in the progress note.    VITAL SIGNS:   Current Vital Signs with 24 Hour Ranges:    Visit Vitals  BP (!) 147/99 (BP Location: Left arm, Patient Position: Semi-Fowler)   Pulse 89   Temp 36.4 C (97.5 F) (Oral)   Resp 26      Oxygen Therapy  SpO2: (!) 94 %  Patient Activity During SpO2 Measurement: At rest  Oxygen Therapy: None (Room air)  O2 Delivery Method: Nasal cannula  O2 Flow Rate (L/min): 2 L/min     Intake/Output Summary (Last 24 hours) at 09/19/2023 0758  Last data filed at 09/18/2023 1300  Gross per 24 hour   Intake --   Output 150 ml   Net -150 ml        PHYSICAL EXAMINATION:     General: Lying in bed, in mild distress  HEENT: Normocephalic, atraumatic, PERRLA, oral mucosa moist  Neck: Supple, normal ROM  Respiratory: CTAB, no wheezing or crackles, not in labored breathing  Cardiovascular: Normal rate and rhythm, no murmur  Gastrointestinal: Soft, nondistended, nontender, positive bowel sounds  Musculoskeletal: Right knee swollen with tenderness on palpation, in brace.  Neurologic: AAOx3, CN II to XII intact, pain limiting motor exam of  bilateral lower extremities, reduced sensation over bilateral legs   Psychiatric: Calm, cooperative    LABS:     Results from last 7 days   Lab Units 09/19/23  0608 09/18/23  0606 09/17/23  1319   WBC AUTO K/uL 6.9 7.2 10.8   HEMOGLOBIN g/dL 44.0 10.2 72.5   HEMATOCRIT % 42.2 37.3 39.4   PLATELETS AUTO K/uL 296 278 288         Results from last 7 days   Lab Units 09/19/23  0608 09/18/23  0606 09/17/23  1308   SODIUM mmol/L 140 142 139   POTASSIUM mmol/L 3.9 3.9 3.8   CHLORIDE mmol/L 110 112* 109   CO2 mmol/L 24 25 27    BUN mg/dL 13 17 16    CREATININE mg/dL 3.66 4.40 3.47   ANION GAP mmol/L 6 5 3      Results from last 7 days   Lab Units 09/19/23  0608 09/18/23  0606 09/17/23  1308   CALCIUM mg/dL 9.2 8.1* 8.8   MAGNESIUM mg/dL  --  1.8 1.9     Results from last 7 days   Lab Units 09/19/23  0608 09/18/23  0606 09/17/23  1308   ALT U/L 22 20 21    AST U/L 20 25 25    ALK PHOS U/L  114 105 114   BILIRUBIN TOTAL mg/dL 0.5 0.6 0.6           No lab exists for component: "LABPROT"  Lab Results   Component Value Date    TROPIHS 14 09/17/2023    TROPIHS 9 08/21/2023    TROPIHS 17 08/12/2023     Lab Results   Component Value Date    NTPROBNP 371 (H) 08/12/2023     Estimated Creatinine Clearance: 61.1 mL/min (by C-G formula based on SCr of 0.84 mg/dL).    MICROBIOLOGY:     Results       Procedure Component Value Units Date/Time    COVID-19 and Other Respiratory Viral PCR Tests [[id number]]  (Normal) Collected: 09/17/23 1808    Order Status: Completed Specimen: Mucosa from Nasopharyngeal Updated: 09/17/23 1853    Narrative:      The following orders were created for panel order COVID-19 and Other Respiratory Viral PCR Tests.  Procedure                               Abnormality         Status                     ---------                               -----------         ------                     SARS-CoV-2 and Influenza.Marland KitchenMarland Kitchen[[id number]]  Normal              Final result                 Please view results for these tests on the  individual orders.             IMAGING:         MR LUMBAR SPINE WO CONTRAST   Final Result   1.  Severe spinal canal stenosis with cauda equina nerve root compression at L4-L5 secondary to anterolisthesis, ligamentum flavum thickening, and disc bulge with central protrusion.   2.  Additional severe left neural foraminal stenosis at L5-S1. Less pronounced areas of neural foraminal stenosis as above.      Florencia Reasons 09/18/2023 5:30 PM      CT KNEE RIGHT WO CONTRAST   Final Result   Probable acute fracture of the superior patellar spur with adjacent large suprapatellar joint effusion. The lucency has increased from prior radiograph dated 11/11/2021.      Subcutaneous soft tissue fat stranding anterior to the patella, likely due to edema.      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      Chondrocalcinosis of the knee joint.      Alexi Otrakji 09/18/2023 11:47 AM      XR KNEE LEFT 3 VIEWS   Final Result   No evidence of a fracture or dislocation.      Michael Litter, MD 09/17/2023 6:13 PM      XR KNEE RIGHT 3 VIEWS   Final Result      1. Large joint effusion.    2. Mild to moderate tricompartmental degenerative changes.   3. Findings suspicious for fractured osteophyte along superior patella. Correlation with pain symptoms is advised.  Delice Bison, MD 09/17/2023 6:04 PM      XR HUMERUS RIGHT   Final Result   Fracture in the right proximal humeral surgical neck.      Michael Litter, MD 09/17/2023 6:08 PM      XR RIBS RIGHT 2 VIEWS W CHEST   Final Result   Fractures in the right ninth and 10th ribs.   Fracture in the right proximal humerus.      Michael Litter, MD 09/17/2023 6:11 PM      XR SHOULDER RIGHT 2+ VIEWS   Final Result   Slight interval mild displacement of the known comminuted proximal humeral shaft.      Clarisse Gouge, MD 09/18/2023 1:59 PM      CT HEAD WO CONTRAST   Final Result      No acute territorial infarct, space-occupying lesion, or intracranial hemorrhage.      Florencia Reasons 09/17/2023 4:37 PM       CT CERVICAL SPINE WO CONTRAST   Final Result      1.  No acute traumatic injury of the cervical spine.      Abigail Butts, MD 09/17/2023 4:40 PM      CT LUMBAR SPINE WO CONTRAST   Final Result      Mild degenerative changes without radiographically apparent acute findings.      Ardis Rowan, MD 09/17/2023 4:45 PM           CURRENT MEDICATIONS:     citalopram, 20 mg, oral, Daily  dexAMETHasone, 4 mg, oral, q8h  enoxaparin, 40 mg, subcutaneous, Daily  gabapentin, 100 mg, oral, Nightly  influenza, 1 Dose, intramuscular, Once  lidocaine, 1 patch, topical (top), Daily  lisinopril, 10 mg, oral, Daily  pantoprazole, 40 mg, oral, Daily before breakfast  tolterodine, 1 mg, oral, BID         PRN medications: acetaminophen **OR** acetaminophen **OR** acetaminophen, baclofen, melatonin, morphine, ondansetron ODT **OR** ondansetron     ASSESSMENT & PLAN:     Abir Eroh is a 73 y.o. 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  She has fractured her ribs, shoulder and hurt her knee during this fall.  She also mentions that at times she does not feel the ground beneath her, feels like she is walking on cotton wool.  TSH within normal, vitamin B12 elevated  Seen by neurology, will need outpatient workup with EMG  -CT head, CT cervical spine were unremarkable  -Evaluated by PT, recommending STR     #R Knee effusion   Knee x-ray showing Large joint effusion, Findings suspicious for fractured osteophyte  along superior patella, CT right knee showing acute fracture of superior patellar spur with adjacent large suprapatellar joint effusion, increased from x-ray, severe tricompartmental OA  Evaluated by orthopedics, she does not require any operative intervention, should be given knee immobilizer, WBAT, to follow-up with Dr. Dorna Bloom outpatient     #  Closed fracture in the right proximal humeral surgical neck.  Was seen by orthopedics on 09/13/2023, sling and swath applied, patient advised on care of device     #Rib fractures  -CT chest from 08/21/2023 showing fractures right posterolateral ninth and 10th ribs  Conservative management, incentive spirometry   -Pain control for above fractures with morphine 2 mg every 4 hourly as needed, patient was initially on Dilaudid which she felt did not help her pain     #Elevated CK, resolved  - s/p IV fluids    Pre Diabetes  A1c  5.8    Hypertension  Lisinopril 10 mg daily continues to be more hypertensive today, likely in the setting of pain, added hydralazine 5 mg every 8 hourly as needed for systolic blood pressures more than 1 6    neuropathy  On gabapentin, TSH normal, will need outpatient urology evaluation    DVT prophylaxis: Lovenox  GI prophylaxis: Diet  CODE STATUS: Full code  Disposition: Short-term rehab    Disclaimer: This note was generated with [employer] voice recognition program.  Please excuse any errors which may have been overlooked during review.  Sometimes, these errors may affect the content or meaning of a given sentence.    Zara Council, MD  Hospitalist  [facility]  September 19, 2023

## 2023-09-19 NOTE — Consults (Signed)
History Of Present Illness  Molly Jensen is a 73 y.o. female presenting with degenerative lumbar spondylosis for which neurosurgery is consulted.  She presents with recurrent falls and chronic back pain.  She has had 3 falls recently.  Sometimes she falls because her legs give out on her, at other times she cannot recall why she falls and feels that she loses consciousness.  As a result of the falls, she has broken her right shoulder, right ribs, right patella.      In regards to her back, she had an L5-S1 discectomy years ago [facility]) with improvement of the leg pain but persistent chronic back pain refractory to steroid injections.  She is on chronic opioids, and has weaned to nightly gabapentin and tramadol which make her symptoms tolerable.  She has intermittent pain radiating down both legs, changing sides at times.  She ambulated independently until this past November at which point she started to use an rollator.  She has no new numbness or constant pain radiating down the legs.  She is voiding without saddle anesthesia     Past Medical History  She has a past medical history of Degenerative joint disease and Hypertension.    Surgical History  She has a past surgical history that includes Hysterectomy; Cholecystectomy; and CTA CHEST W AND WO CONTRAST (08/12/2023).     Allergies  Latex    ROS: as noted in HPI    Objective:  NAD awake alert oriented conversant following all commands  PERRL EOMI FS TML  Right shoulder sling, right knee immobilizer  LUE 5/5, distal RUE 5/5  BLE 5/5 including the right knee extensor  SS LTi  DTR 2+.  No ankle clonus, negative Hoffmann's, no Babinski  Patient sits up to 90 degrees without issue    Last Recorded Vitals   Blood pressure (!) 160/91, pulse 91, temperature 36.6 C (97.8 F), temperature source Oral, resp. rate 17, height 1.575 m, weight 82.4 kg, SpO2 (!) 92%.    Assessment/Plan   73 year old female with recurrent falls since November 2024, partly attributed to  legs giving out on her.  She had a prior discectomy many years ago with subsequent chronic low back pain refractory to steroid injections.  She takes gabapentin and tramadol nightly to manage her pain.  MRI obtained here reveals L4-L5 grade 1 anterolisthesis with central stenosis, moderate to severe, right L4 foraminal cyst, L4-5 facet effusions.  No evidence of traumatic fracture or large disc herniation.    Given that her main symptom is chronic low back pain, without new neurological motor or sensory deficit, no inpatient surgical intervention is recommended.  She should follow-up as an outpatient to discuss elective lumbar decompression versus instrumented fusion.  Surgery not recommended at this time as her right knee fracture and right proximal humerus fracture with significantly inhibit mobility postoperatively. We will sign off.

## 2023-09-19 NOTE — Progress Notes (Signed)
Case Management Progress Note:     09/18/23 1504   Case Management Initial Assessment   Patient Discharged Before Able to Interview/Assess No   Source of Information Family   Type of Residence Multilevel Home   Lives With Alone   Discharge Services Anticipated at this Time Yes   Expected Discharge Needs VNA;SNF   Patient Information   Interpreter Needs None   Home Caregiver Family   Accompanied by Family   Support Systems Children/Family   Need PCP on Discharge No   Current Patient Responsibilities Personal Care;Housekeeping;Meal Prep;Shopping;Driving   PTA Functional Status   Is patient prescribed an anti-coagulant? No   Current Functional Status   Bathing Independent   Toileting Independent   Walking in Home Independent   Assistive Device Used None   Community Mobility Independent      Case Management Progress Note:   Spoke to the daughter [name] for initial screen. Pt lives alone. Indep with Ad[facility] and [facility]LS prior to injuries. Had multiple recent hospital visits/admissions. Has Rib fx from 08/21/23, Humerus fx from 09/12/23, had another fall now with Knee injury (no fracture). WBAT. PT recommending rehab.     This CM spoke with patient and daughter [name] at [facility]is[facility]. They are in agreement with rehab. Agreed to local referrals (both live in [place]). Pt asked that decisions on rehabs go through daughter [name]. This CM called later in shift to review bed offers with daughter, she accepts bed offer at [facility]. MD anticipates dc in 24-48 hours pending neuro consult.   Tod Persia, RN  09/18/23  3:12 PM    09/19/23. Case Management Progress Note:   Patient is still having pain. Has Lidocaine Patch and taking PRN IV Morphine and Tylenol. Neurology saw patient today, recommended Neurosurgery opinion and OP EMG. Patient already has a bed at [facility] and can take tomorrow when medically ready.  Anticipating discharge in 24 hours. COC following.  Angela Burke, RN  09/19/23  12:33 PM

## 2023-09-19 NOTE — Consults (Signed)
NEUROLOGY CONSULT    Date: 09/19/2023  Chief Complaint: falls  Referring: IM  HPI:    Jamaya Sleeth is a 73 y.o. female who has experienced falls over the last month. History of gait instability prior though not as severe. She has history of lumbar spine disease, chronic back pain (surgery on her back perhaps 15 years or so ago) and polyneuropathy - numbness and discomfort in her feet for years. She thinks she's had 4 falls - two of these falls she "blacked out" leading to the fall the two others she's not certain. She thinks her legs are generally weak, but joint pain and neuropathic pain seem to be most prominent and she thinks if there was no pain, her walking and ability to use the legs would improved. No hx tremor or abnl movements. No cognitive features noted. No lateralized neurological phenomenon. Right leg painful as is right arm and shoulder from fall with humerus fracture, right patellar spur fracture and rib fractures.      ROS:  10 systems checked and negative except as noted above    Past Medical History:   Diagnosis Date    Degenerative joint disease     Hypertension        Family History   Problem Relation Name Age of Onset    No Known Problems Mother      No Known Problems Father         Social History     Socioeconomic History    Marital status: Widowed     Spouse name: None    Number of children: None    Years of education: None    Highest education level: None   Occupational History    None   Tobacco Use    Smoking status: Never    Smokeless tobacco: Never   Vaping Use    Vaping status: Never Used   Substance and Sexual Activity    Alcohol use: Never    Drug use: Never    Sexual activity: Defer   Other Topics Concern    None   Social History Narrative    None     Social Determinants of Health     Financial Resource Strain: Low Risk  (09/18/2023)    Overall Financial Resource Strain (CARDIA)     Difficulty of Paying Living Expenses: Not hard at all   Food Insecurity: Unknown (09/18/2023)    Hunger  Vital Sign     Worried About Running Out of Food in the Last Year: Never true     Ran Out of Food in the Last Year: Not on file   Recent Concern: Food Insecurity - Food Insecurity Present (08/12/2023)    Hunger Vital Sign     Worried About Running Out of Food in the Last Year: Sometimes true     Ran Out of Food in the Last Year: Not on file   Transportation Needs: No Transportation Needs (09/18/2023)    PRAPARE - Therapist, art (Medical): No     Lack of Transportation (Non-Medical): No   Physical Activity: Not on file   Stress: Not on file   Social Connections: Not on file   Intimate Partner Violence: Not on file   Housing Stability: Unknown (09/18/2023)    Housing Stability Vital Sign     Unable to Pay for Housing in the Last Year: Not on file     Number of Times Moved in the Last Year: Not  on file     Homeless in the Last Year: No       Allergies   Allergen Reactions    Latex Rash     Red bumps in mouth       Current Outpatient Medications   Medication Instructions    ALPRAZolam (XANAX) 0.5 mg, oral, 2 times daily PRN    citalopram (CELEXA) 20 mg, oral, Daily    gabapentin (NEURONTIN) 100 mg, oral, Nightly    lisinopril 10 mg, oral, Daily    oxybutynin XL (DITROPAN-XL) 10 mg, oral, Daily, Do not crush, chew, or split.        Medications:  citalopram, 20 mg, oral, Daily  dexAMETHasone, 4 mg, oral, q8h  enoxaparin, 40 mg, subcutaneous, Daily  gabapentin, 100 mg, oral, Nightly  influenza, 1 Dose, intramuscular, Once  lidocaine, 1 patch, topical (top), Daily  lisinopril, 10 mg, oral, Daily  pantoprazole, 40 mg, oral, Daily before breakfast  tolterodine, 1 mg, oral, BID           PRN Meds:.PRN medications: acetaminophen **OR** acetaminophen **OR** acetaminophen, baclofen, melatonin, morphine, ondansetron ODT **OR** ondansetron    GENERAL EXAM:  General: NAD  Lungs: normal respiratory effort  Heart: RRR, 2+ peripheral pulse  Neck: NL ROM  MSK: see neuro exam    VS:  Vitals:    09/19/23 0824    BP: (!) 161/115   Pulse:    Resp:    Temp:    SpO2:        NEURO EXAM:  Mental Status:  Orientation: intact  Attention: alert attentive, normal concentration  Language: intact  Memory: intact  Neglect: no neglect  Fund of knowledge: normal  Affect: full    CN:  I: not tested  Il: vision intact, pupils symmetric and reactive  II,IV,Vl: EOMI, no ptosis  VII: no facial weakness  VIII: no nystagmus, hearing intact  IX, X, Xl:  no hypophonia  XII: no dysarthria    MTR: full upper though right limited by pain, no abnl movements, no tremor, no rigidity or spasticity. Lower limited by pain but 4-5/5 proximal left leg, full ankle movements but painful more on left  SNSRY: hypesthetic to ankle bilaterally - decreased vibratory/prop, normal proximally  COORD: no exculsive ataxia  GAIT: deferred  RFLX: trace lower, 2+ upper    IMAGING/LABS:   Lab Results   Component Value Date    WBC 6.9 09/19/2023    HGB 13.7 09/19/2023    HCT 42.2 09/19/2023    MCV 90.4 09/19/2023    PLT 296 09/19/2023      Lab Results   Component Value Date    TSH 1.740 09/17/2023      Lab Results   Component Value Date    VITAMINB12 544 09/17/2023      Lab Results   Component Value Date    GLUCOSE 164 (H) 09/19/2023    CALCIUM 9.2 09/19/2023    NA 140 09/19/2023    K 3.9 09/19/2023    CO2 24 09/19/2023    CL 110 09/19/2023    BUN 13 09/19/2023    CREATININE 0.84 09/19/2023      Lab Results   Component Value Date    ALT 22 09/19/2023    AST 20 09/19/2023    ALKPHOS 114 09/19/2023    BILITOT 0.5 09/19/2023      Lab Results   Component Value Date    HGBA1C 5.8 (H) 08/21/2023     No results found for: "  LDL" No results found for: "LDLDIRECT"   Encounter Date: 09/17/23   ECG 12 lead    Narrative    HEART RATE: 86  RR Interval: 700  P-R Interval: 156  P Duration: 112  P Front Axis: 38  QRSD Interval: 85  QT Interval: 403  QTcB: 482  QTcF: 454  QRS Axis: -27  T Wave Axis: 15  QTc [place]: 449  QTc Hodges: 447  ECG Severity:   - ABNORMAL ECG -  ECG Impression:    Sinus rhythm  Left ventricular hypertrophy  No significant change compared to prior     === 09/17/23 ===    MR LUMBAR SPINE WO CONTRAST    - Impression -  1.  Severe spinal canal stenosis with cauda equina nerve root compression at L4-L5 secondary to anterolisthesis, ligamentum flavum thickening, and disc bulge with central protrusion.  2.  Additional severe left neural foraminal stenosis at L5-S1. Less pronounced areas of neural foraminal stenosis as above.    Florencia Reasons 09/18/2023 5:30 PM    ASSESSMENTI/PLAN:   Hendel Gatliff is a 73 y.o. 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 No findings to suggest a movement disorder. Recommend workup and neurosurgical opinion on lumbar spine disease. Outpatient EMG and NCV of seeming length dependent neuropathic features (though this may turn out to be more radiculopathic in nature). Medical management of RA. PT and OT as tolerated, physical countermaneuvers to any postural lightheadedness particularly if orthostatic hypotension is found.    Christene Slates, MD  Neurology

## 2023-09-19 NOTE — Care Plan (Signed)
Goal Outcome Evaluation:      Patient received in bed at start of shift, no distress noted, Reported 9/10 pain in right knee, medicated with morphine as ordered with good effect. Educated on pain management, receptive to information. Noted with b/p 164/100 at hs, given PRN hydralazine with sbp down to 159 on recheck. Patient able to transfer from bed to commode this shift with 1 assist. Reported constipation this shift, medicated with colace and senna, effect pending, will endorse. At end of shift, patient resting comfortably in bed, no distress noted, call bell in place, bed alarm on and functioning.

## 2023-09-20 LAB — COMPREHENSIVE METABOLIC PANEL
ALT: 17 U/L (ref 0–55)
AST: 19 U/L (ref 6–42)
Albumin: 3.3 g/dL (ref 3.2–5.0)
Alkaline phosphatase: 93 U/L (ref 30–130)
Anion Gap: 7 mmol/L (ref 3–14)
BUN: 21 mg/dL (ref 6–24)
Bilirubin, total: 0.4 mg/dL (ref 0.2–1.2)
CO2 (Bicarbonate): 24 mmol/L (ref 20–32)
Calcium: 9.5 mg/dL (ref 8.5–10.5)
Chloride: 109 mmol/L (ref 98–110)
Creatinine: 0.91 mg/dL (ref 0.55–1.30)
Glucose: 142 mg/dL — ABNORMAL HIGH (ref 70–110)
Potassium: 3.7 mmol/L (ref 3.6–5.2)
Protein, total: 7.2 g/dL (ref 6.0–8.4)
Sodium: 140 mmol/L (ref 135–146)
eGFRcr: 67 mL/min/{1.73_m2} (ref >=60)

## 2023-09-20 LAB — CBC
Hematocrit: 37.8 % (ref 32.0–47.0)
Hemoglobin: 12.5 g/dL (ref 11.0–16.0)
MCH: 29.3 pg (ref 26.0–34.0)
MCHC: 33.1 g/dL (ref 31.0–37.0)
MCV: 88.5 fL (ref 80.0–100.0)
MPV: 9.3 fL (ref 9.1–12.4)
NRBC %: 0 % (ref 0.0–0.0)
NRBC Absolute: 0 10*3/uL (ref 0.00–2.00)
Platelets: 325 10*3/uL (ref 150–400)
RBC: 4.27 M/uL (ref 3.70–5.20)
RDW-CV: 15 % — ABNORMAL HIGH (ref 11.5–14.5)
RDW-SD: 48.8 fL (ref 35.0–51.0)
WBC: 9.2 10*3/uL (ref 4.0–11.0)

## 2023-09-20 LAB — CK: CK: 105 U/L (ref 30.0–200.0)

## 2023-09-20 MED ORDER — oxyCODONE (Roxicodone) 5 mg immediate release tablet
5 | ORAL_TABLET | Freq: Three times a day (TID) | ORAL | 0 refills | 8.00000 days | Status: AC | PRN
Start: 2023-09-20 — End: 2023-09-23

## 2023-09-20 MED ORDER — sennosides (Senokot) 8.6 mg tablet
8.6 | ORAL_TABLET | Freq: Every evening | ORAL | 0 refills | Status: AC | PRN
Start: 2023-09-20 — End: 2023-10-20

## 2023-09-20 MED ORDER — ibuprofen 600 mg tablet
600 | ORAL_TABLET | Freq: Four times a day (QID) | ORAL | 0 refills | Status: AC | PRN
Start: 2023-09-20 — End: 2023-09-30

## 2023-09-20 MED ORDER — docusate sodium (Colace) 100 mg capsule
100 | ORAL_CAPSULE | Freq: Two times a day (BID) | ORAL | 0 refills | Status: AC
Start: 2023-09-20 — End: ?

## 2023-09-20 MED ORDER — lidocaine 4 % patch
4 | MEDICATED_PATCH | Freq: Every day | TOPICAL | 0 refills | 30.00000 days | Status: AC
Start: 2023-09-20 — End: 2023-10-05

## 2023-09-20 MED FILL — DOCUSATE SODIUM 100 MG CAPSULE: 100 100 mg | ORAL | Qty: 1

## 2023-09-20 MED FILL — PANTOPRAZOLE 40 MG TABLET,DELAYED RELEASE: 40 40 mg | ORAL | Qty: 1

## 2023-09-20 MED FILL — TOLTERODINE 1 MG TABLET: 1 1 mg | ORAL | Qty: 1

## 2023-09-20 MED FILL — LISINOPRIL 5 MG TABLET: 5 5 mg | ORAL | Qty: 2

## 2023-09-20 MED FILL — DEXAMETHASONE 4 MG TABLET: 4 4 mg | ORAL | Qty: 1

## 2023-09-20 MED FILL — CITALOPRAM 20 MG TABLET: 20 20 mg | ORAL | Qty: 1

## 2023-09-20 MED FILL — ENOXAPARIN 40 MG/0.4 ML SUBCUTANEOUS SYRINGE: 40 40 mg/0.4 mL | SUBCUTANEOUS | Qty: 0.4

## 2023-09-20 MED FILL — ACETAMINOPHEN 325 MG TABLET: 325 325 mg | ORAL | Qty: 2

## 2023-09-20 MED FILL — LIDOCAINE 4 % TOPICAL PATCH: 4 4 % | TOPICAL | Qty: 1

## 2023-09-20 MED FILL — MORPHINE 2 MG/ML INJECTION WRAPPER: 2 2 mg/mL | INTRAMUSCULAR | Qty: 1

## 2023-09-20 MED FILL — SENNOSIDES 8.6 MG TABLET: 8.6 8.6 mg | ORAL | Qty: 2

## 2023-09-20 MED FILL — GABAPENTIN 100 MG CAPSULE: 100 100 mg | ORAL | Qty: 1

## 2023-09-20 MED FILL — HYDRALAZINE 20 MG/ML INJECTION SOLUTION: 20 20 mg/mL | INTRAMUSCULAR | Qty: 1

## 2023-09-20 NOTE — Care Plan (Signed)
Goal Outcome Evaluation:    VSS.  C/w pain in R knee and shoulder.  Morphing and tylenol for pain, with some effect.  Report given to [facility].  Pain 8/10-morphine given early per Dr. Greggory Stallion at time of transport.  Pt discharged to rehab via ambulance.

## 2023-09-20 NOTE — Care Plan (Signed)
Goal Outcome Evaluation:  Plan of Care Reviewed With: patient  Progress: improving  Outcome Evaluation: A&Ox4. NSR on the monitor. PRN IV Morphine administered per [facility] for pain, educated patient on pain management. One assist to the bedside commode. PT/OT rec rehab. Neurosurgery rec o/p f/u. Patient's safety maintained. Bed alarm set. Call light, phone, and bedside table within reach. All needs met at this time.

## 2023-09-20 NOTE — Discharge Summary (Signed)
HOSPITALIST DISCHARGE SUMMARY      DATE OF ADMISSION: 09/17/2023  DATE OF DISCHARGE: 09/20/2023  Number of days in the hospital: Hospital Day: 4    CODE STATUS     Full Code     ADMISSION DIAGNOSIS:   Recurrent falls    DISCHARGE DIAGNOSIS:     Acute: Severe spinal canal stenosis, right knee effusion, elevated creatinine kinase    Chronic: Closed fracture right proximal humerus surgical neck, rib fractures, prediabetes, hypertension, neuropathy    PROCEDURES AND TREATMENT PROVIDED:     PROCEDURES:    CONSULTS: Neurology, Dr. Laurell Josephs, neurosurgery      ADMISSION HISTORY   Admission history copy pasted from admitting physician's note.    Narda Fundora is a 73 y.o. female with a PMH of diabetes, hypertension, neuropathy and recurrent syncope who was admitted due to falls.  The patient had 2 falls today.  She denied hitting her head or passing out.  She is not on anticoagulation.  Also reported to have decreased p.o. intake.  She was on the floor until 5:30 AM when her daughter comes to see her and that is when she was found.  She denied fever, chills, SOB, cough, abdominal pain, vomiting or diarrhea.     HOSPITAL COURSE:     Sahej Hauswirth is a 73 y.o. female with a PMH of diabetes, hypertension, neuropathy and recurrent falls.  She had an MRI done which showed evidence of severe spinal canal stenosis     # Severe spinal canal stenosis   She has a history of severe back pain radiating into her right leg her MRI shows severe spinal canal stenosis with concern for cauda equina, this was reviewed by neurosurgery, who felt that she does not clinically have cauda equina syndrome, she was transiently started on dexamethasone 4 mg 8 hourly which was later discontinued.  Based on her chronic symptoms, no new neurological motor or sensory deficit, there was no recommendation for surgical intervention inpatient.  She will need to follow-up as an outpatient to discuss elective lumbar decompression versus instrumented fusion.  Surgery  was not recommended at this time given her right knee fracture and right proximal humerus fracture which would significantly inhibit mobility postoperatively.  She was asked to follow-up with Dr. Marjie Skiff on an outpatient basis to discuss the same.     # Recurrent falls  Patient has had 3 major falls so far, at each location, she denies any history of dizziness or lightheadedness, she feels weak in her legs, and her legs giving way and she falls.  She has fractured her ribs, shoulder and hurt her knee during this fall.  She also mentions that at times she does not feel the ground beneath her, feels like she is walking on cotton wool.  TSH was within normal limits, B12 is awaited at the time of discharge.  Orthostatic vitals were negative, she was seen by PT, who recommended rehab        #R Knee effusion   Knee x-ray showing Large joint effusion, Findings suspicious for fractured osteophyte along superior patella, CT right knee showing acute fracture of superior patellar spur with adjacent large suprapatellar joint effusion, increased from x-ray, severe tricompartmental OA  Evaluated by orthopedics, she does not require any operative intervention, should be given knee immobilizer, WBAT, to follow-up with Dr. Dorna Bloom outpatient     # Closed fracture in the right proximal humeral surgical neck.  Was seen by orthopedics on 09/13/2023, sling and swath  applied, patient advised on care of device     #Rib fractures  -CT chest from 08/21/2023 showing fractures right posterolateral ninth and 10th ribs  Conservative management, incentive spirometry   -Pain control for above fractures with morphine 2 mg every 4 hourly as needed, patient was initially on Dilaudid which she felt did not help her pain     #Elevated CK, likely secondary to a fall, this had resolved at the time of discharge    # Diabetes, hypertension continue home medications    VITAL SIGNS AND PHYSICAL EXAMINATION AT THE TIME OF DISCHARGE:     Visit Vitals  BP (!)  155/90   Pulse 71   Temp 36.5 C (97.7 F) (Oral)   Resp 12       General: Lying in bed, in mild distress  HEENT: Normocephalic, atraumatic, PERRLA, oral mucosa moist  Neck: Supple, normal ROM  Respiratory: CTAB, no wheezing or crackles, not in labored breathing  Cardiovascular: Normal rate and rhythm, no murmur  Gastrointestinal: Soft, nondistended, nontender, positive bowel sounds  Musculoskeletal: Right knee swollen with tenderness on palpation, in brace.  Neurologic: AAOx3, CN II to XII intact, pain limiting motor exam of bilateral lower extremities  Psychiatric: Calm, cooperative    MR LUMBAR SPINE WO CONTRAST   Final Result   1.  Severe spinal canal stenosis with cauda equina nerve root compression at L4-L5 secondary to anterolisthesis, ligamentum flavum thickening, and disc bulge with central protrusion.   2.  Additional severe left neural foraminal stenosis at L5-S1. Less pronounced areas of neural foraminal stenosis as above.      Florencia Reasons 09/18/2023 5:30 PM      CT KNEE RIGHT WO CONTRAST   Final Result   Probable acute fracture of the superior patellar spur with adjacent large suprapatellar joint effusion. The lucency has increased from prior radiograph dated 11/11/2021.      Subcutaneous soft tissue fat stranding anterior to the patella, likely due to edema.      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      Chondrocalcinosis of the knee joint.      Alexi Otrakji 09/18/2023 11:47 AM      XR KNEE LEFT 3 VIEWS   Final Result   No evidence of a fracture or dislocation.      Michael Litter, MD 09/17/2023 6:13 PM      XR KNEE RIGHT 3 VIEWS   Final Result      1. Large joint effusion.    2. Mild to moderate tricompartmental degenerative changes.   3. Findings suspicious for fractured osteophyte along superior patella. Correlation with pain symptoms is advised.         Delice Bison, MD 09/17/2023 6:04 PM      XR HUMERUS RIGHT   Final Result   Fracture in the right proximal humeral surgical neck.       Michael Litter, MD 09/17/2023 6:08 PM      XR RIBS RIGHT 2 VIEWS W CHEST   Final Result   Fractures in the right ninth and 10th ribs.   Fracture in the right proximal humerus.      Michael Litter, MD 09/17/2023 6:11 PM      XR SHOULDER RIGHT 2+ VIEWS   Final Result   Slight interval mild displacement of the known comminuted proximal humeral shaft.      Clarisse Gouge, MD 09/18/2023 1:59 PM      CT HEAD WO CONTRAST   Final Result  No acute territorial infarct, space-occupying lesion, or intracranial hemorrhage.      Florencia Reasons 09/17/2023 4:37 PM      CT CERVICAL SPINE WO CONTRAST   Final Result      1.  No acute traumatic injury of the cervical spine.      Abigail Butts, MD 09/17/2023 4:40 PM      CT LUMBAR SPINE WO CONTRAST   Final Result      Mild degenerative changes without radiographically apparent acute findings.      Ardis Rowan, MD 09/17/2023 4:45 PM           DISCHARGE TO:    rehab    DISCHARGE CONDITION:   stable    DISCHARGE MEDICATIONS:        Your medication list        START taking these medications        Instructions Last Dose Given Next Dose Due   docusate sodium 100 mg capsule  Commonly known as: Colace      Take 1 capsule (100 mg) by mouth twice daily.       ibuprofen 600 mg tablet      Take 0.5 tablets (300 mg) by mouth every 6 (six) hours if needed for pain score 4-6 (moderate) for up to 10 days.       oxyCODONE 5 mg immediate release tablet  Commonly known as: Roxicodone      Take 1 tablet (5 mg) by mouth every 8 (eight) hours if needed for pain score 7-10 (severe) for up to 3 days.       sennosides 8.6 mg tablet  Commonly known as: Senokot      Take 2 tablets (17.2 mg) by mouth if needed at bedtime for constipation.              CONTINUE taking these medications        Instructions Last Dose Given Next Dose Due   citalopram 20 mg tablet  Commonly known as: CeleXA      Take 1 tablet (20 mg) by mouth once daily.       gabapentin 100 mg capsule  Commonly known as: Neurontin      Take 1 capsule (100  mg) by mouth at bedtime.       lidocaine 4 % patch      Apply 1 patch topically once daily for 15 days.       lisinopril 10 mg tablet      Take 10 mg by mouth in the morning.       oxybutynin XL 10 mg 24 hr tablet  Commonly known as: Ditropan-XL      Take 10 mg by mouth once daily. Do not crush, chew, or split.              STOP taking these medications      ALPRAZolam 0.5 mg tablet  Commonly known as: Xanax                  Where to Get Your Medications        These medications were sent to [id number] [place] - [address] AT [facility] [id number]  [address], [place] [address]      Phone: [phone]   docusate sodium 100 mg capsule  ibuprofen 600 mg tablet  lidocaine 4 % patch  sennosides 8.6 mg tablet       You  can get these medications from any pharmacy    Bring a paper prescription for each of these medications  oxyCODONE 5 mg immediate release tablet          DISCHARGE INSTRUCTIONS:   You were admitted with back pain, knee pain after a fall, were noted to have a small fracture in one of your bones of the knee.  You was seen by orthopedics, advised to be in a brace, you will need to follow-up with orthopedics outpatient.  You also had an MRI of your back, which showed Severe spinal canal stenosis, you were seen by neurosurgery, who opined that you did not need anything acutely done, you will need to follow-up with them on an outpatient basis.  They had suggested that they would discuss surgery with you at follow-up.  Please follow-up with Dr. Ella Jubilee,   [facility]  [address]  [address]  [place], [place] [address]  Phone Number  [phone]    PENDING LABS:     Vitamin B12    OUTPATIENT FOLLOW UP:     Verne Grain, MD     No future appointments.    CC: Please send a copy of the discharge summary to patient's primary care doctor.    FACE TO FACE PATIENT COUNSELLING COORDINATING CARE MORE THAN 50% OF ENCOUNTER TIME  : YES    TOTAL ENCOUNTER TIME: 50 min    Disclaimer: This note was generated with Dragon [employer] voice recognition program.  Please excuse any errors which may have been overlooked during review.  Sometimes, these errors may affect the content or meaning of a given sentence.    Zara Council, MD  Hospitalist  [facility]  September 20, 2023

## 2023-09-20 NOTE — Progress Notes (Signed)
09/20/23 1209   Case Management Discharge Note    Ambulance Log  [facility]     Case Management Progress Note:   Discharge to [facility] in [place] via [facility] ambulance. Daughter [name] is aware.  Elwanda Brooklyn, RN  09/20/2023  12:10 PM

## 2023-09-20 NOTE — Discharge Instructions (Addendum)
You were admitted with back pain, knee pain after a fall, were noted to have a small fracture in one of your bones of the knee.  You was seen by orthopedics, advised to be in a brace, you will need to follow-up with orthopedics outpatient.  You also had an MRI of your back, which showed Severe spinal canal stenosis, you were seen by neurosurgery, who opined that you did not need anything acutely done, you will need to follow-up with them on an outpatient basis.  They had suggested that they would discuss surgery with you at follow-up.  Please follow-up with Dr. Ella Jubilee,   [facility]  [address]  [address]  [place], [place] [address]  Phone Number  [phone]

## 2023-09-28 NOTE — Progress Notes (Signed)
 Is the patient appropriate for Home Care?     Homebound Status: ***  Covid status: ***  Insurance carrier: Payor: MEDICARE / Plan: MEDICARE PART A AND B / Product Type: Medicare /     Date of potential discharge: ***    Is there a next day need?     Does the patient have a PCP?     Is the patient a [facility] patient?    Does patient use Home O2    Have your cordinated with the IP care team on the patients IV?   Have your coordinated with the IP care team on the patients Drains?   Have your coordinated with the IP care team on the patients Wounds?

## 2023-09-28 NOTE — Telephone Encounter (Signed)
I am calling in regard to a referral for home health services we received from Dr.    I would like to confirm your address: [address] [place] [address]   Address confirmed: No - Comment: [address]   [place] [address]    I would like to confirm your best contact number: [phone]   Contact number confirmed: Yes    Do you have any other home care services or agencies coming into your home?    Other agency present: No    Do you have another nurse or physical therapist visiting you at home?    Other staff present: No    Do you have an appointment scheduled with your doctor in the next 7 days?  MD follow-up appointment: Yes - Comment: 2/13@11AM     Can I confirm that [place], MD is your primary care physician?   PCP confirmed: Yes ASHOK JOSHI    If there are pets in the home, please ensure that your pet is in another room during the clinicians' home visits.     If there are weapons in the home, they need to be locked up and secured. If not, the clinician will need to leave the home.     Is anyone in the home experiencing flu-like symptoms or respiratory symptoms?    Flu-like symptoms: No    Did you have any new or changed medications that need to be picked up at the pharmacy?    New medications: No    Were you able to pick them up?    All medications in the home: N/A    Do you have the oxygen in your home?    Have oxygen at home: No    Do you have any supplies you need until our staff comes to your home?    Need supplies immediately: No    Could you please make your insurance card available for visit?    Could you please have all your medications available for your visit, both your prescriptions and any over-the-counter meds you are taking?    The services ordered for you are: Nursing, Physical Therapy, and Occupational Therapy    The team caring for you is [facility]. I am the scheduler for you and my name is or the scheduler for you is [employer]. My/their return number is [phone]. Please feel free to  call me back if there is anything I can help with.     Comments: na

## 2023-09-29 NOTE — Telephone Encounter (Signed)
Left voicemail per prompt requesting call back regarding patient's SOC date, awaiting call back

## 2023-09-30 NOTE — Telephone Encounter (Signed)
PT [facility] 10/01/23

## 2023-10-01 ENCOUNTER — Encounter: Primary: Internal Medicine

## 2023-10-01 NOTE — Telephone Encounter (Signed)
TC to DR Earlean Polka requesting updated SOC date of 10/04/23 per patients request

## 2023-10-01 NOTE — Telephone Encounter (Signed)
Verbal order received from Molly Jensen at Dr Hewitt Blade office for [facility] 10/04/23 per patients request

## 2023-10-03 ENCOUNTER — Inpatient Hospital Stay: Admit: 2023-10-03 | Payer: MEDICARE

## 2023-10-03 DIAGNOSIS — R051 Acute cough: Secondary | ICD-10-CM

## 2023-10-04 ENCOUNTER — Encounter: Admit: 2023-10-04 | Discharge: 2023-10-04 | Payer: MEDICARE | Primary: Internal Medicine

## 2023-10-04 NOTE — Home Health (Signed)
 Patient age/gender/living situation/Primary Dx for homecare/Reason for Admission: Molly Jensen is a 73 y.o. female with a PMH of diabetes, hypertension, neuropathy and recurrent syncope. Patient reports she does not have DM, and is unsure why it is on her medical record. Patient was hospitalized from 09/17/23 - 09/20/2023 and then transferred to [facility]. Patient was then DC'd to home from [facility]. Patient presented to the hospital after 2 falls in the same day. Patient has had 3 major falls so far, at each location, she denies any history of dizziness or lightheadedness, she feels weak in her legs, and her legs giving way and she falls. She has fractured her ribs, shoulder and hurt her knee during this fall.  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 Has outpatient follow up with neurologist Dr. Margot Ables. Patient has seen Dr. Delories Heinz as outpatient appt for her R humoral Fx and R knee fx. Patient lives alone at baseline but her sister has come in from out of town to stay with her and provide assistance. Patient lives a 2 story town house. Shower and bedroom are located on the second floor. Patient has placed a bed in her living room to decrease need to go upstairs.     Precautions: Per Dr. Delories Heinz' notes, she is to be NWB through R UE sling PRN She can do pendulums and wall crawls. She can do range of motion of her elbow wrist and digits. She is local modalities over-the-counter medications as needed.  WBAT through R LE brace PRN      Current Problems requiring skilled service to be addressed by above interventions (clinical, mental, adl and functional mobility): Patient was (I) at baseline. Ambulating with no device, (I) with ADLs, driving. Patient now requires CGA with ambulation due to risk of LEs buckling as this has been her MOI for her previous 3 falls. Requires CGA with all transfers. Requires assistance with all ADLs. She is a high fall risk per TUG score of 19 seconds  and MAHC-10 score of 7.     Psychosocial and cognitive issues identified which may impact plan of care: None    Any additional services requested based on risk assessment? MSW, OT     Homebound Status: Patient is homebound due to high fall risk and assistance required to safely leave the home.     Patient Identified care representative: [name]- daughter    Education: Patient participated in gait training with use of SPC. Adjust SPC to appropriate height. Required cuing for sequencing. Educated patient's sister on use of gait belt. Reinforced NWB status of R UE. Established seated LE strengthening HEP. Seated seated: LAQ, hip march, toe raise, heel raise x10e B. Supine SLR, bridge x10e        Assessment/plan : Patient is a 73 y/o who presents to skilled PT below baseline level. Patient presents with impaired gait, impaired balance, dec strength, and is a high fall risk. Patient will benefit from skilled PT to address impairments and reduce fall risk. Plan for skilled PT per POC. Patient in agreement with plan.     Reviewed DC plan at [facility] with:  Patient Yes  Caregiver/representative Yes  Call made to Dr. Reita May office for VO and confirm meds. Office was closed. Unable to leave message. Will call back during normal business hours.

## 2023-10-07 ENCOUNTER — Encounter: Admit: 2023-10-07 | Discharge: 2023-10-07 | Payer: MEDICARE | Primary: Internal Medicine

## 2023-10-07 NOTE — Home Health (Signed)
 Patient is a 73 y/o who was referred to [facility] s/p multiple falls. Patient is NWB through R UE. Patient seen for follow up PT visit. Has f/u for ortho 10/21/23. Denies any acute changes. Denies any falls. Patient participated in stair training to get to second floor. Patient ascended/descending 12 steps with HRA and adhered to NWB status through R UE. Followed with standing LE therex. Patient performed standing Standing hip march, hip abd, knee flexion, heel raise, mini squat x10e B. Required visual demonstration of all. Advised to keep chair near by incase she needed to sit quickly. Reviewed supine bridge and SLR x10e. Required cuing during SLR for TKE. Patient is making progress towards goals. Plan to continue skilled PT to improve gait, balance, endurance, and dec fall risk. Patient continues to remain homebound due to being a high fall risk and requires assistance to leave the home making it a taxing effort.

## 2023-10-07 NOTE — Home Health (Signed)
 OCCUPATIONAL THERAPY INITIAL EVALUATION 10-07-23    HISTORY OF PRESENT ILLNESS: Patient is 73 year old female referred to Homecare services/occupational therapy s/p hospitalization 09/17/23 - 09/20/2023 followed by short term rehab stay at [facility] after 2 falls in the same day resulting in right humoral fracture and right knee fracture. Patient has had 3 major falls recently and denies any history of dizziness or lightheadedness, she feels weak in her legs, and her legs giving way and she falls. She has fractured her ribs, shoulder and hurt her knee during this fall.  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 Has outpatient follow up with neurologist Dr. Margot Ables. Patient has seen Dr. Delories Heinz as outpatient appt for her Right humoral Fx and R knee fx. Marland Kitchen PMH of diabetes, hypertension, neuropathy and recurrent syncope. Patient lives alone but her sister has come in from out of state to stay with her and provide assistance for next week. OT therapeutic interventions addressed today include OT intial evaluation, pt/caregiver education, safety with ADLs, functional mobility and transfer training, therapeutic exercise as prescribed by orthopedist,  DME/AE recommendation/training    PLOF/LIVING SITUATION PER PATIENT REPORT: Patient was independent with functional mobility and transfers without a device. Pt independent with ADLs/IADLs and driving. Pt lives alone in 2 level townhouse. Pt's husband passed away 3 years ago.  Pt's daughter lives in the neighborhood and provide support when needed.      FALL RISK: fall risk (MACH-10: 7/10 indicating a high risk for falls)    PRECAUTIONS:  Universal, Fall risk, NWB right UE (sling PRN), WBAT RLE     AE/DME: cane,  tub bench, suction cup GB, hand-held shower head. provided recommnedation for tubseat due to glass shower doors.     COGNITION:  Alert and oriented x 4.      PAIN:  Patient reports right shoulder pain with reported pain level 4-7/10.  Patient managing pain with tylenol    VITALS:  Within parameters    UE ROM/STRENGTH:  Right hand dominant.  LUE strength and AROM WFL . Right UE s/p Right humoral Fx. Placed in sling. Patient followed by Dr. Delories Heinz. Per Dr. Delories Heinz' notes, pt is to be NWB through R UE,  sling PRN She has been cleared to do pendulums and wall crawls and AROM of her elbow wrist and digits. Teaching initiated during today by OT      GI/GU:  NO Urinary incontinence.  No reported bowel issues.    TRANSFERS/MOBILITY:  Cues required with transfers for slow positional change. Tub transfers;  CTG and verbal cues step over method  with use of suction cup grab bar due to shower doors. OT measured dimensions of tub and recommended appropriate tubseat.    Toilet transfers:CTG. Bed mobility: supervision.  Patient is ambulating within the home using no device, requires CTG due to hx of knee buckling    ADLs:  Grooming: Set-up and Supevision. Sponge bathing: Set-up and SBA.  Dressing; Supervision . Showering: Not showering at this time. OT provided education/recommendation for tubseat. Pt has hand held shower head and suction cup grab bar and OT placed in proper postion to improve safety     IADLs:  Assist for  meal preparation at RW level.     ACTIVITY TOLERANCE: Good for mobility, ADLs.    Patient presents below functional baseline for ADL's/mobility with limiting factors including LUE precuations/limitiations, weakness and risk for falls. Patient would benefit from skilled OT to address patient/caregiver education,  ADL/IADL retraining, therapeutic exercise/HEP, safety during functional mobility, transfer training, DME/AE recommendation/training. Role of OT discussed with pt/sister and they in agreement with POC. MD notified of POC

## 2023-10-08 ENCOUNTER — Encounter: Primary: Internal Medicine

## 2023-10-08 ENCOUNTER — Ambulatory Visit
Admit: 2023-10-08 | Discharge: 2023-10-08 | Payer: MEDICARE | Attending: Physician Assistant | Primary: Internal Medicine

## 2023-10-08 DIAGNOSIS — G629 Polyneuropathy, unspecified: Secondary | ICD-10-CM

## 2023-10-08 NOTE — Progress Notes (Signed)
 Neurosurgery Office Visit Note    Patient:  Molly Jensen    DOB:  Dec 23, 1950  MRN:  [id number]   Encounter Date: 10/08/2023    Reason for Visit: Falls   PCP: Verne Grain, MD     History of Present Illness    Molly Jensen is a 73 y.o. female underwent L5-S1 discectomy nearly 20 years ago in [place].  After this operation she regained her ability to ambulate but has had persistent back pain as well as left hip and thigh pain.  She also woke up with "neuropathy" in her feet bilaterally.  Each of the symptoms have been present for decades.  The back and leg pain have remained the same over the years, but neuropathy has worsened.  The worsening is described by the symptoms now involving her ankles, this is described as a stabbing knife sensation.  She has been on tramadol and gabapentin for this neuropathy symptoms for quite a while.  Her neuropathy is much worse than her back pain.  In January of this year she sustained 4 falls in a period of 2 weeks.  She cannot explain why she fell and afterwards she had difficulty controlling her legs before coming to the hospital.  Since she has been home she has not fallen and she has no distrust in her legs.  She also denies any distress in her legs prior to these falls.  Patient was told that her lumbar stenosis was the cause for her falls and she expected today's visit to be a surgical discussion          Medications  Current Outpatient Medications   Medication Instructions    ALPRAZolam (XANAX) 0.5 mg, oral, Nightly PRN    citalopram (CELEXA) 20 mg, oral, Daily    docusate sodium (COLACE) 100 mg, oral, 2 times daily    gabapentin (NEURONTIN) 100 mg, oral, Nightly    lisinopril 10 mg, oral, Daily    oxybutynin XL (DITROPAN-XL) 10 mg, oral, Daily, Do not crush, chew, or split.    sennosides (SENOKOT) 17.2 mg, oral, Nightly PRN    traMADol (ULTRAM) 100 mg, oral, Daily       Allergies  Allergies   Allergen Reactions    Latex Rash     Red bumps in mouth       Past Medical  History  Past Medical History:   Diagnosis Date    Degenerative joint disease     Hypertension        Past Surgical History  Past Surgical History:   Procedure Laterality Date    CHOLECYSTECTOMY      CTA CHEST W AND WO CONTRAST  08/12/2023    CTA CHEST W AND WO CONTRAST 08/12/2023 [facility]    HYSTERECTOMY         Social History  Social History     Tobacco Use    Smoking status: Never    Smokeless tobacco: Never   Vaping Use    Vaping status: Never Used   Substance Use Topics    Alcohol use: Never    Drug use: Never       Family History  Family History   Problem Relation Name Age of Onset    No Known Problems Mother      No Known Problems Father         Review of Systems:  A 12-point review of systems was completed and negative unless otherwise reported in the HPI.    Physical Examination  Visit Vitals  BP (!) 141/78 (BP Location: Left arm, Patient Position: Sitting, BP Cuff Size: Adult)   Pulse 79   Ht 1.575 m   Wt 75.3 kg   BMI 30.36 kg/m   OB Status Hysterectomy   BSA 1.82 m        General:  NAD, alert, oriented, speaking clearly, following commands well.    Respiratory: Breathing comfortably.  Neuro exam: PERRL, EOMI, TML, FS, no drift  Head and neck: Neck supple, head is atraumatic.  Motor: Full strength in upper and lower extremities.  Gait: Normal    Assessment/Plan:    [name] was seen today for follow-up.    Diagnoses and all orders for this visit:    Neuropathy  -     EMG; Future    Spinal stenosis of lumbar region, unspecified whether neurogenic claudication present           Molly Jensen is a 73 y.o. with history of prior L5-S1 discectomy nearly 20 years ago with residual back pain, left leg pain as well as neuropathy in her bilateral feet.  Back and hip pain are well-controlled.  Her neuropathy is her primary complaint.  She also fell 4 times in a period of 2 weeks at the beginning of this year.  She was told that lumbar decompression would help her falls as well as neuropathy.  At this point in time I am  not convinced that her back has anything to do with her falls which are nondescript and could be from any number of reasons.  (And more importantly have not returned). She does have neuropathy in her feet which is known to cause balance disturbances.  I would like to obtain lower extremity EMGs to formally diagnose neuropathy and rule out any radicular symptoms.  I would also like her to see the pain clinic to undergo diagnostic injections in the lumbar spine.  If her feet improve with epidural injections, and if EMGs are not consistent with neuropathy, then could consider lumbar decompression.        Dorthula Perfect, PA

## 2023-10-09 ENCOUNTER — Inpatient Hospital Stay: Admit: 2023-10-09 | Discharge: 2023-10-09 | Payer: MEDICARE

## 2023-10-09 DIAGNOSIS — R9389 Abnormal findings on diagnostic imaging of other specified body structures: Secondary | ICD-10-CM

## 2023-10-10 ENCOUNTER — Encounter: Admit: 2023-10-10 | Discharge: 2023-10-10 | Payer: MEDICARE | Primary: Internal Medicine

## 2023-10-10 NOTE — Home Health (Signed)
 OT NOTE 09-22-23      Skilled services required this visit to address clinical and/or functional declines resulting from the following problems:  s/p hospitalization and short term rehab stay after 2 falls in the same day resulting in right humoral fracture, rib fracture and right kneepain. Patient has had 3 major falls recently and denies any history of dizziness or lightheadedness. No defininative cause of falls to date. PMH of diabetes, hypertension, neuropathy and recurrent syncope.      Patient progress toward goals this visit as evidenced by (compare to prior values):   Patient's sister present for teaching. OT therapeutic interventions addressed today include pt/caregiver education, safety with ADLs, pain management training,  energy conservation/pacing, ther ex/HEP, DME/AE recommendation/training. Patient has follow-up PCP appoinement this week and was seen by neurology on 2-18 and per report, neurology PA would like to do further investigating whether patient's LE neuropathy was more the cause of falls rather than her back/spinal stenosis. Patient reports she is waking up at night with right shoulder pain. Patient has been given Tylenol for pain PRN and was given Tylenol in the hospital /rehab forpain but only takes it PRN. Recommend pt taking dose as recommended one hour per bed to see if that decreased night time pain. Instructed patient to periodically reposition RUE in bed in supine. Reposition positions education provided. Patient verbalized intenet to try the pain relief measures. Pt reports having difficulty with instructed pendulum ther ex. OT provided further teaching and modified stance.Instructed patient to stand slowly betweeen each set and wait at least 30-60 seconds bewteen sets in standing due to hx of syncope. Pt dmeosntrated understanding via teachback. Pt reports she has been showering with recommended DME with good results/no issues.        Patient continues to require the skills of a  occupational Therapist to address the following next visit: safety with ADLs,IADLs, pain mgt, ther ex/HEP    Patient is at risk of decline in the following areas if goals not met: risks for falls/injury due to weakness/RUE limitiation/precautions/impaired balance

## 2023-10-10 NOTE — Home Health (Signed)
 Patient is a 73 y/o who was referred to [facility] s/p multiple falls. Patient is NWB through R UE. Patient seen for follow up PT visit. Denies any acute changes. Denies any falls. Patient reports she went to the neurologist yesterday. States she was told she does not need surgery to fix her stenosis. States that the the PA does not think her falls are being caused by her stenosis. Patient states she is going to get a second opinion from the MD. Patient has obtained ankle weights. Adjusted weights to 2.5#. Patient participated in standing LE therex. Patient performed standing Standing hip march, hip abd, knee flexion, heel raise x10e B all with 2.5# weights. Required cuing to keep trunk in midline when in SLS. Followed with seated LAQ 2x10 with 2.5#. Required cuing for pacing. Patient then performed supine SLR in bed x10 with 2.5#. Required cuing to maintain TKE through out ROM.  Patient is making progress towards goals. Plan to continue skilled PT to improve gait, balance, endurance, and dec fall risk. Patient continues to remain homebound due to being a high fall risk and requires assistance to leave the home making it a taxing effort.

## 2023-10-11 ENCOUNTER — Encounter: Primary: Internal Medicine

## 2023-10-11 ENCOUNTER — Encounter: Admit: 2023-10-11 | Discharge: 2023-10-11 | Payer: MEDICARE | Primary: Internal Medicine

## 2023-10-11 NOTE — Home Health (Signed)
 MSW evaluation- Patient and sister present. Patient is a 42yr old female who resides in a private, rented two story duplex. Patient observed to be neat, clean & appropriately dressed. Patient's home appeared clean. Patient observed to be alert, oriented x 3 & engaging. Patient observed to be wearing a right(dominant) side sling and hospital bed on the first floor. Patient reports her dtr, [name] is her HCP and lives nearby. Patient's sister has been temp staying w/the patient to help with daily needs and coordination of care but she is leaving Monday 2/24. Sister verbalized needs the patient will have once she is gone, PERS, DME, Home Care, transportation, and socialization. [facility] is coming Monday for an intake. MSW discussed and left written information about, [facility], Home Care program, copayment system and suggested they ask about, MOW's, PERS and Home care services, [employer], [employer] for tranpsortation and DME and a handicap placard. Patient reports she moved to [place] to help her dtr w/the care of her grandchildren after one of her grandchildren was killed [place] however now that she needs help she is not sure if she will stay or move back to [place]. Patient in agreement to MSW intervention.

## 2023-10-14 ENCOUNTER — Encounter: Primary: Internal Medicine

## 2023-10-14 ENCOUNTER — Encounter: Admit: 2023-10-14 | Discharge: 2023-10-14 | Payer: MEDICARE | Primary: Internal Medicine

## 2023-10-14 NOTE — Home Health (Signed)
 OT NOTE 10-14-23        Skilled services required this visit to address clinical and/or functional declines resulting from the following problems: s/p hospitalization and short term rehab stay after 2 falls in the same day resulting in right humoral fracture, rib fracture and right kneepain. Patient has had 3 major falls recently and denies any history of dizziness or lightheadedness. No defininative cause of falls to date. PMH of diabetes, hypertension, neuropathy and recurrent syncope.     Patient progress toward goals this visit as evidenced by (compare to prior values): Patient's sister present for teaching. OT therapeutic interventions addressed today include pt/caregiver education, safety with ADLs, pain management training, energy conservation/pacing, ther ex/HEP, DME/AE recommendation/training.  Patient reports she continues to wake  up at night with right shoulder pain. Pt reports she did try recommended pain mgt measures such as taking her pain mgt medication one hour prior to bedtime and repositioning strategies in bed. Provided training for alternative bed poitioning including sidelying on left side with pillow stabilizing RUE. Pt demonstrated undedrstanding of instructedposition via teachback. Pt able to independently position pillow. Pt plans to try sidelying tonight. Recommned use of ice, 20 min as well. Pt reports she is starting to get discomfort in her trapezius most likely due to use of sling. Instructed how to properly position RUE without use of sling using pillows when seated on couch. Pt's sister to return to [place] tomorrow. Pt's sister has prepared food and froze so patient only has to reheat. Provided training for positioning and strategy for patient to use right hand to write, including fill out check for money mgt, maintaining RUE precautions. Pt demosntrated understanidng via teachback.  Initiated counter glides for right UE. Patient tolerated well. HEP to include 10 reps , 3 times per  day. written instructions provided    Patient continues to require the skills of a occupational Therapist to address the following next visit: safety with ADLs,IADLs, pain mgt, ther ex/HEP   Patient is at risk of decline in the following areas if goals not met: risks for falls/injury due to weakness/RUE limitiation/precautions/impaired balance

## 2023-10-15 ENCOUNTER — Encounter: Primary: Internal Medicine

## 2023-10-15 ENCOUNTER — Encounter: Admit: 2023-10-15 | Discharge: 2023-10-15 | Payer: MEDICARE | Primary: Internal Medicine

## 2023-10-15 NOTE — Case Communication (Signed)
 Molly Jensen  - Care conference 10-15-23:    Care conference today with Barbra Sarks, OT, Lars Pinks , [facility] clinical manager, and Janeece Fitting, MSW. Pt also receiving PT but not present during today's care conference. Pt progressing well functionally. Pt's sister, from out of state has been staying with patient to assist during recovery but leaving today. Patient recently met with [facility] regarding possible services, [employer] and transportation. Patient has been set up with [employer] for groceries, prepared meals. Patient has progressed to independence with ADL/light homemaking using recommended DME and and one handed techniques. No reported falls since being home. Pt continues to progress with balance and functional mobility with no device/cane. MSW providing education regarding community resource as patient lives alone. Pain mgt training for right should er ongoing.Pt reports pain interrupting sleep. Pt progressing well with RUE there ex as outlined by ortho.

## 2023-10-15 NOTE — Home Health (Signed)
 Patient is a 73 y/o who was referred to [facility] s/p multiple falls. Patient is NWB through R UE. Patient seen for follow up PT visit. Denies any acute changes. Denies any falls. Patient met with [facility] this morning. They are going to assist her with getting life alert. Patient reports inc in R UE pain. Discussed using ice for pain management. Patient states she has been completing some of her HEP but would like to review exercises. Patient performed standing hip march, hip abd, knee flexion, hip ext, heel raise x10e B all with 2.5# weights. Required cuing to keep trunk in midline when in SLS. Patient then performed STS from chair x10. Advised to place chair against wall to avoid chair from sliding. Followed with seated LAQ , hip march, heel raise with 2.5#. Required cuing for pacing. Patient then performed supine SLR in bed x10 with 2.5#. Required cuing to maintain quad contraction through out movement. Provided patient with written list of HEP.  Patient is making progress towards goals. Plan to continue skilled PT to improve gait, balance, endurance, and dec fall risk. Patient continues to remain homebound due to being a high fall risk and requires assistance to leave the home making it a taxing effort.

## 2023-10-16 ENCOUNTER — Encounter: Primary: Internal Medicine

## 2023-10-17 ENCOUNTER — Encounter: Primary: Internal Medicine

## 2023-10-18 ENCOUNTER — Encounter: Admit: 2023-10-18 | Discharge: 2023-10-18 | Payer: MEDICARE | Primary: Internal Medicine

## 2023-10-18 ENCOUNTER — Encounter: Primary: Internal Medicine

## 2023-10-18 NOTE — Home Health (Signed)
 Patient is a 73 y/o who was referred to [facility] s/p multiple falls. Patient is NWB through R UE. Patient seen for follow up PT visit.  Denies any falls. Patient states she had B knee pain after last visit which lasted 2 days. Improved today. Modified HEP due to reports of pain. Decreased weight for LAQ and SLR from 2.5 to 1.5#. Patient reported no pain during these exercises. Modified bridge exercise to dec knee flexion due to reported pain. Sx improved after modification. Patient also with reports on knee pain during sit to stand from chair. Changed to mini squat at sink. Patient reported relief from dec ROM. Advised patient to take note of sx over next 48 hours. Good understanding by patient. Discussed using SPC when ambulating outdoors as she would like to go for a short walk with her friends.   Patient is making progress towards goals. Plan to continue skilled PT to improve gait, balance, endurance, and dec fall risk. Patient continues to remain homebound due to being a high fall risk and requires assistance to leave the home making it a taxing effort.

## 2023-10-18 NOTE — Home Health (Signed)
 OT NOTE 10-18-23   Skilled services required this visit to address clinical and/or functional declines resulting from the following problems: s/p hospitalization and short term rehab stay after 2 falls in the same day resulting in right humoral fracture, rib fracture and right kneepain. Patient has had 3 major falls recently and denies any history of dizziness or lightheadedness. No definative cause of falls to date. PMH of diabetes, hypertension, neuropathy and recurrent syncope.   Patient progress toward goals this visit as evidenced by (compare to prior values): OT therapeutic interventions addressed today include pt/caregiver education, safety with ADLs, pain management training,  there ex/HEP, DME/AE recommendation/training. Patient's sister has returned to [place] and patient now home alone. Patient reports she is doing well. She feels confident showering, using recommended  tubseat and GB. She does not have a lifeline as pf yet but does have phone within reach as OT instructed. Patient reports she continues to wake up at night with right shoulder pain. Pt reports she did try recommended  alternative bed positioning including sidelying on left side with pillow stabilizing RUE. Pt reports position felt good, decreasing right UE pain for short duration but then had to reposition. Pt reports she is taking Tylenol PRN as she felt may have been upsetting her stomach. Patient reports she had noticed some tingling in her fingers of right hand on occasion with use of sling and  RUE pain decreased when not using sling so pt has decided to no use sling which MD ordered for comfort. Pt reports tingling subsided.  Tightness in trapezius (right) noted most likely from sling use/guarding. Provided recommendation for use of heat 20 minutes, several times per day on area of trapezius followed by there ex. Provided instruction for elevation/depression with 10 sec hold,  2 sets of 5. and slow cervical rotation. Pt verbalized  understanding. Progressed patient with right shoulder there ex including "walk the wall"  3 reps, 2 times daily . Pt achieved 85 degrees flexion. Pt educated s/s to discontinue. Pt tolerated well with no pain reported. Pt has follow-up ortho appointment 10-21-23.   Patient continues to require the skills of a occupational Therapist to address the following next visit: safety with ADLs,IADLs, pain mgt, there ex/HEP   Patient is at risk of decline in the following areas if goals not met: risks for falls/injury due to weakness/RUE limitation/precautions/impaired balance

## 2023-10-21 ENCOUNTER — Encounter: Primary: Internal Medicine

## 2023-10-21 ENCOUNTER — Encounter: Admit: 2023-10-21 | Discharge: 2023-10-21 | Payer: MEDICARE | Primary: Internal Medicine

## 2023-10-21 NOTE — Unmapped (Signed)
 I certify that this patient is confined to his/her home and needs intermittent skilled nursing care, physical therapy/ and/or speech therapy or continues to need occupational therapy.  This patient is under my care and I have authorized the services on this plan of care and will periodically review the plan. I further certify that this patient had a Face-to-Face Encounter performed by physician on 09/20/2023 or allowed non-physician practitioner that was related to the primary reason the patient requires Home Health services.

## 2023-10-21 NOTE — Unmapped (Signed)
 Verbal SOC was obtained on 10/04/2023.

## 2023-10-22 ENCOUNTER — Encounter: Admit: 2023-10-22 | Discharge: 2023-10-22 | Payer: MEDICARE | Primary: Internal Medicine

## 2023-10-22 ENCOUNTER — Encounter: Primary: Internal Medicine

## 2023-10-23 ENCOUNTER — Encounter: Admit: 2023-10-23 | Discharge: 2023-10-23 | Payer: MEDICARE | Primary: Internal Medicine

## 2023-10-23 NOTE — Case Communication (Signed)
 Patient contacted OT and reported ws not feeling well. She was [facility] but not feeling up to therapy

## 2023-10-24 ENCOUNTER — Encounter: Primary: Internal Medicine

## 2023-10-24 ENCOUNTER — Encounter: Admit: 2023-10-24 | Discharge: 2023-10-24 | Payer: MEDICARE | Primary: Internal Medicine

## 2023-10-24 NOTE — Home Health (Signed)
 OT NOTE 10-24-23       Skilled services required this visit to address clinical and/or functional declines resulting from the following problems: s/p hospitalization and short term rehab stay after 2 falls in the same day resulting in right humoral fracture, rib fracture and right kneepain. Patient has had 3 major falls recently and denies any history of dizziness or lightheadedness. No definitive cause of falls to date. PMH of diabetes, hypertension, neuropathy and recurrent syncope.     Patient progress toward goals this visit as evidenced by (compare to prior values): OT therapeutic interventions addressed today include pt/caregiver education, safety with ADLs, fall prevention/safety training. After multiple calls and attempts to reach pt, OT was able to connect with her daughter [name] who met OT at patient's home. Pt had multiple missed visits this week due not feeling well. Pt reports she has not felt well over the past few days with primary symptom of lethargy. Pt also reports decreased appetite. Attempted to eat soup sister had prepared for her when visiting and froze. Pt reports she was throwing up the soup but able to eat other foods. Pt reports she has been urinating and having consistent bowel movements and has been compliant with medications. Patient reports shea has been sleeping well at night but sleeping most of day as well fir several days. Vitals taken and HR slightly elevated compared to previous visits. No reports of lightheadness and no SOB noted or observed. No slurred speech, increase on balance issues noted or observed. Patient observed bending down to floor to pick up piece of paper with right UE with no sway noted or reports of pain or lightheadness. Patient's affect flat. Pt denied symptoms of depression and UTI. Contacted patient's PCP and reported findings to NP, Tresa Endo, who recommended patient go to ER and stated she has spoken with patient/daughter on previous days and had recommended pt  go to ER the last few days. Pt did not followed through with recommendation and declined going to ER again today and OT made NP aware. Confirmed with NP medication change pt reported including discontinuation of lisinopril and addition of losartan 50 mg , 1 tab by mouth . Bottle viewed in home and educated patient of medication change. Pt verbalized understanding. Pt/daughter educated on s/s and when to seek emergency medical intervention. Recommend patient have telephone on her body at all times and continue to hydrate .      Patient continues to require the skills of a occupational Therapist to address the following next visit: safety with ADLs,IADLs, pain mgt, there ex/HEP, fall prevention/safety training, decrease risk for hospitalization   Patient is at risk of decline in the following areas if goals not met: risks for falls/injury due to weakness/RUE limitation/precautions, increased risk for hospitalization

## 2023-10-25 ENCOUNTER — Encounter: Primary: Internal Medicine

## 2023-10-28 ENCOUNTER — Encounter: Primary: Internal Medicine

## 2023-10-29 ENCOUNTER — Encounter: Admit: 2023-10-29 | Discharge: 2023-10-29 | Payer: MEDICARE | Primary: Internal Medicine

## 2023-10-29 ENCOUNTER — Encounter: Primary: Internal Medicine

## 2023-10-30 ENCOUNTER — Encounter: Primary: Internal Medicine

## 2023-10-30 ENCOUNTER — Encounter: Admit: 2023-10-30 | Discharge: 2023-10-30 | Payer: MEDICARE | Primary: Internal Medicine

## 2023-10-31 ENCOUNTER — Encounter: Primary: Internal Medicine

## 2023-10-31 ENCOUNTER — Encounter: Admit: 2023-10-31 | Discharge: 2023-10-31 | Payer: MEDICARE | Primary: Internal Medicine

## 2023-10-31 NOTE — Home Health (Signed)
 OT NOTE 10-31-23     Skilled services required this visit to address clinical and/or functional declines resulting from the following problems: s/p hospitalization and short term rehab stay after 2 falls in the same day resulting in right humoral fracture, rib fracture and right kneepain. Patient has had 3 major falls recently and denies any history of dizziness or lightheadedness. No definitive cause of falls to date. PMH of diabetes, hypertension, neuropathy and recurrent syncope.     Patient progress toward goals this visit as evidenced by (compare to prior values): OT therapeutic interventions addressed today include pt/caregiver education, safety with ADLs, fall prevention/safety training, UE ther ex/HEP. Patient reports she had PCP appointment this morning and during appointment she was informed that she has Lupus. Pt unclear as to when she will see a specialist. Patient reports she was ok, psychologically ok, somewhat relieved that she knows why she was so fatigued (sleeping all day). Patient reports she has transitioned from sleeping on a temporary bed in living room to sleeping upstairs in bedroom as recommended by OT. Patient reports she is eating more regularly and drinking liquids/water. Pt reports she has intermittent right shoulder/arm pain 0-6/10 but not when sleeping anymore. She reports he arm feels better after movement. Provided education/training for right UE ther ex as patient reports she had not been performing due to sleeping most of day. Provided training for counter slides in standing and "walking the wall". Pt progessed to 10 reps and 110 degrees right shoulder flexion. Instructed to perform 4 times per day. Pt utilizing heat for pain relief. Pt educated on s/s to discontine ther ex and pain vs soreness. Plan to assess accruacy of carryover HEP and progress next OT revisit.   Patient continues to require the skills of a occupational Therapist to address the following next visit: safety with  ADLs,IADLs, pain mgt, there ex/HEP, fall prevention/safety training, decrease risk for hospitalization   Patient is at risk of decline in the following areas if goals not met: risks for falls/injury due to weakness/RUE limitation/precautions, increased risk for hospitalization

## 2023-10-31 NOTE — Home Health (Signed)
 Patient is a 73 y.o. female with a PMH of diabetes, hypertension, neuropathy and recurrent syncope. Patient reports she does not have DM, and is unsure why it is on her medical record. Patient was hospitalized from 09/17/23 - 09/20/2023 and then transferred to [facility]. Patient was then DC'd to home from [facility]. Patient presented to the hospital after 2 falls in the same day. Patient has had 3 major falls so far, at each location, she denies any history of dizziness or lightheadedness, she feels weak in her legs, and her legs giving way and she falls. She has fractured her ribs, shoulder and hurt her knee during this fall. 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 Patient has made progress towards goals since IE. She has had no additional falls since [facility]. Patient has improved ambulation status to supervision in the home with no AD and SBA with SPC outside the home, improved balance as evident by decreased TUG score from 19 seconds to 11 seconds. She has improved strength as evident by increased 30 SSTS from 8 to 10 reps. She requires supervision with transfers. Patient reports that there are now concerns that she has Lupus. She is to undergo testing. She is also having an EMG next week for her LEs. Patient will benefit from continued skilled PT to address strength, gait, and reduce fall risk. Plan to continue per POC.

## 2023-10-31 NOTE — Home Health (Signed)
 lupt. blood pressure, moved upstair. us , 110 dreses. Geedes on th3e 21 . life line. driving

## 2023-11-01 ENCOUNTER — Encounter: Admit: 2023-11-01 | Discharge: 2023-11-01 | Payer: MEDICARE | Primary: Internal Medicine

## 2023-11-01 ENCOUNTER — Encounter: Primary: Internal Medicine

## 2023-11-01 NOTE — Home Health (Signed)
 OCCUPATIOANAL THERAPY RE-ASSESSMENT 11-01-23       Patient is 73 year old female receiving  Homecare occupational therapy services s/p hospitalization 09/17/23 - 09/20/2023 followed by short term rehab stay at [facility] after 2 falls in the same day resulting in right humoral fracture and right knee fracture. Patient has had 3 major falls recently and denies any history of dizziness or lightheadedness, she feels weak in her legs, and her legs giving way and she falls. Patient recently diagnosed eith Lupus. PMH of diabetes, hypertension, neuropathy and recurrent syncope.  OT therapeutic interventions addressed today include OT re-assessment, pt education, safety with ADLs, therapeutic exercise as prescribed by orthopedist, DME/AE recommendation/training . Patient has follow-upwith orthopedist on 12-09-23. Patient demonstrating good carryover with instructed home exercise program. Patient achieved 126 right shoulder flexion AAROM "walking the wall". Patient is right hand dominant. Provided education that pt is to remian NWB RUE until discontinued by orhtopedist. Provided training for functional use of right UE maintaining precautions established by MD. Patient progressing well with occupational therapy but would continue to benefit from continuation of OT services to address patient/caregiver education, ADL/IADL retraining, therapeutic exercise/HEP, safety during functional mobility, transfer training, DME/AE recommendation/training. Pt in agreement. Re-assessment status as follows:      FALL RISK: fall risk (MACH-10: 7/10 indicating a high risk for falls)     PRECAUTIONS: Universal, Fall risk, NWB right UE   AE/DME: cane, tub bench, suction cup GB, hand-held shower head, tubseat    COGNITION: Alert and oriented x 4.   PAIN: Patient reports right shoulder pain with reported pain level  5/10. Patient managing pain with tylenol PRN and heat    VITALS: Within parameters     UE ROM/STRENGTH: Right hand dominant. LUE  strength and AROM WFL . Right UE s/p Right humoral Fx. Placed in sling. Patient followed by Dr. Delories Heinz. Per Dr. Delories Heinz' notes, pt is to be NWB through R UE, sling PRN She has been cleared to do pendulums and wall crawls and AROM of her elbow wrist and digits. Teaching initiated during today by OT     GI/GU: NO Urinary incontinence. No reported bowel issues.     TRANSFERS/MOBILITY: Tub transfers; Independent with GB and  tubseat. Toilet transfers:Independent. Bed mobility: Independent. Patient is ambulating within the home using no device,     ADLs: Grooming: Independent. Sponge bathing: Independent Dressing; Independent . Showering: Independent with GB and  tubseat.     IADLs: Independent for meal preparation ACTIVITY TOLERANCE: Good for mobility, ADLs.

## 2023-11-01 NOTE — Home Health (Signed)
 MSW revisit- Patient observed to be neat, clean & appropriately dressed, home appeared clean. Patient observed to be alert, oriented x 3 & engaging. Patient reports increased fatigue, low endurance and continued impaired use of left arm. Patient reports she saw her pcp and was d/x with Lupus, "its good to have a reason...years ago, I was d/x with Lupus but when I saw a specialist he told me that I didn't have it and that was it..." Patient waiting for an appt with a specialist for treatment options. Patient reports, [facility] has provided a PERS (smart watch, fall detection & cardiac monitoring) "I cancelled the meals because people from my church have started provided meals and no one ever called about the homemaker but I think I am going to try to do without it.Marland KitchenMarland KitchenI really need to start driving...my dtr is not going to be available next month...she really can't handle being responsible for me and I don't want her to, she has enough to deal with...my dtr in [place] wants me to move over there so she can take care of me and I can help with my grandchildren.Marland KitchenMarland KitchenI am not sure what I am going to do, my sister wants me to move to [place], so I have some options..." MSW discussed and provided information for, transportation, COA's outreach support and reviewed [facility] home care program. Patient asked for legal resources and discussed HCP, POA and Guardianship. Patient verbalized being concerned about becoming a burden to her family. MSW discussed and encouraged the patient to talk to her pcp about, Advanced directives, MOLST and ensure her HCP is able and willing to carry out her wishes. MSW provided a blank, HCP, MOLST and provided elder law resources. Patient to review and f/u with the MSW for any final questions for planning

## 2023-11-04 ENCOUNTER — Encounter: Admit: 2023-11-04 | Discharge: 2023-11-04 | Payer: MEDICARE | Primary: Internal Medicine

## 2023-11-04 NOTE — Home Health (Signed)
 OT NOTE 11-04-23     Skilled services required this visit to address clinical and/or functional declines resulting from the following problems: s/p hospitalization and short term rehab stay after 2 falls in the same day resulting in right humoral fracture, rib fracture and right kneepain. Patient has had 3 major falls recently and denies any history of dizziness or lightheadedness. No definitive cause of falls to date. PMH of diabetes, hypertension, neuropathy and recurrent syncope.       Patient progress toward goals this visit as evidenced by (compare to prior values): OT therapeutic interventions addressed today include pt/caregiver education, safety with ADLs, fall prevention/safety training, UE ther ex/HEP. Patient reports pain in right shoulder decreasing. Provided training for dominant UE use with distal movments.  . Provided training for "walking the wall". Pt progessed to 126  degrees right shoulder flexion, 10 repetitions. Patient has follow-up with her orthopedist 3/21, her PCP on 4/3 and neurologist on 4/1. Patient reports she slept a bit better last night but her sleep is still interrupted no by pain but by "not being able to shut off her brain" do to the stress she has been under lately. Occupational therapist also provided training in deep breathing techniques and options for stress/anxiety management . Patient reports she was able to attend Church this weekend with her daughter's assist which helped her psychologically.     Patient continues to require the skills of a occupational Therapist to address the following next visit: safety with ADLs,IADLs, pain mgt, there ex/HEP, fall prevention/safety training, decrease risk for hospitalization   Patient is at risk of decline in the following areas if goals not met: risks for falls/injury due to weakness/RUE limitation/precautions, increased risk for hospitalization

## 2023-11-05 ENCOUNTER — Encounter: Attending: Rehabilitative and Restorative Service Providers" | Primary: Internal Medicine

## 2023-11-07 ENCOUNTER — Ambulatory Visit: Admit: 2023-11-07 | Discharge: 2023-11-07 | Payer: MEDICARE | Attending: Neurology

## 2023-11-07 DIAGNOSIS — G629 Polyneuropathy, unspecified: Secondary | ICD-10-CM

## 2023-11-07 NOTE — Progress Notes (Signed)
 Please see full EMG report in attached file.

## 2023-11-08 ENCOUNTER — Inpatient Hospital Stay: Admit: 2023-11-08 | Payer: MEDICARE | Primary: Internal Medicine

## 2023-11-08 DIAGNOSIS — N189 Chronic kidney disease, unspecified: Secondary | ICD-10-CM

## 2023-11-11 ENCOUNTER — Encounter: Admit: 2023-11-11 | Discharge: 2023-11-11 | Payer: MEDICARE | Primary: Internal Medicine

## 2023-11-11 NOTE — Home Health (Signed)
 MSW d/c- Patient observed to be neat, clean & appropriately dressed. Patient's home appeared clean. Patient observed to be alert, oriented x 3 & engaging. Patient observed to continue to take notes to manage stress of self managing medical issues. 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 it...without taking Gapabentin...my feet are cold no matter how many pairs of socks but when I touch them they are warm, they can feel like daggers...so I don't know, I have to go back to the Neurologist, who said I had it, next month...I see my pcp and I am also going to see a specialist (for Lupus) in [place].Marland KitchenMarland KitchenI saw the ortho MD she said I will have (shoulder) pain for 13yrs but the fx healed, this is ressidual from being in a cast, she said fluid, I have never asked why 24yrs but she said I don't need outpatient therapy because I have what I need to do (HEP), I will talk to the PT/OT..." Patient partially completed MOLST and will finish w/her pcp. Patient verbalized she, wants to be transferred to the hospital but does not CPR, she is a DNR.Marland KitchenMarland KitchenPatient able to teachback where to keep MOLST when completed. Patient reports, " I have seen too much, my second husband was given CPR 4xs.Marland KitchenMarland KitchenI was his HCP and they didn't honor his wishes, thankfully he was able to come out of it enough to say what he wanted.." Patient reports, plans to move to [place] are hold right now...her sister, in [place] continues to search for elder resources/supports. MSW discussed and provdied information about, elder law planning, insurance oversees, supplement insurance in [place] (patient reports she is paying a lot for her supplement plan which is from another state) MSW to search for [employer]. plans w/prices and [employer] resources for f/u. Patient in agreement. No further need for MSW intervention at this time.

## 2023-11-11 NOTE — Case Communication (Signed)
 MSW d/c- Patient's knowledge and access to community resources (home care, legal, advanced directives, insurance resources) increased. Patient able to teachback understanding and will reach out if needed. No further need for MSW intervention at this time

## 2023-11-12 ENCOUNTER — Encounter: Primary: Internal Medicine

## 2023-11-13 ENCOUNTER — Encounter: Admit: 2023-11-13 | Discharge: 2023-11-13 | Payer: MEDICARE | Primary: Internal Medicine

## 2023-11-13 NOTE — Home Health (Signed)
 Patient is a 73 y/o who was referred to [facility] s/p multiple falls. Patient states that she saw her orthopedic surgeon last week. She is cleared for [facility] through her R arm and is now cleared to drive. Patient states that she has been doing well overall. She does have pain in her R shoulder but her surgeon told her this would present for approx 2 years. Reviewed medications with patient and no issues found. Performed TUG test for balance. Patient scored 12.5 seconds indicating dec risk of falls vs 19 seconds at [facility]. Reviewed HEP. Patient has written list of exercises. Discussed appropriate progressions of volume. Patient demonstrated good understanding. Patient is (I) with ambulation in the home with no AD but requires SPC when ambulating outdoors. Patient is appropriate for DC at this time. Patient in agreement with DC.

## 2023-11-13 NOTE — Home Health (Signed)
 OCCUPATIONAL DISCIPLINE DISCHARGE 11-13-23     Patient is 73 year old female receiving Homecare occupational therapy services s/p hospitalization 1 followed by short term rehab stay at [facility] after 2 falls in the same day resulting in right humoral fracture and right knee fracture. Patient has had 3 major falls recently and denies any history of dizziness or lightheadedness, she feels weak in her legs, and her legs giving way and she falls. Patient recently diagnosed eith Lupus. PMH of diabetes, hypertension, neuropathy and recurrent syncope. OT therapeutic interventions addressed today include OT re-assessment for discipline discharge, pt education, therapeutic exercise/HEP, discharge recommendations . Patient had follow-up with orthopedist, Dr Doristine Mango on 12-09-23. Per patient and Orthopedist note, Patient has progressed to WBAT RUE, does need to outpatient therapy, to continue with Homecare HEP,  and  does not need scheduled ortho follow-up.  AROM established and pt instructed in proper technique. Patient progressed to AROM bilateral UE HEP with patient achieving 135 right shoulder flexion in sitting and IR/ER WNL. Patient provided with written directions for UE HEP and has been educated on progression of HEP and s/s to discontinue HEP. Patient has progressed well with Homecare OT and achieved her OT goals. No further OT services indicated at this time. OT discharge staus as follows:       AE/DME: suction cup GB, hand-held shower head, tubseat     COGNITION: Alert and oriented x 4.     PAIN: Patient reports right shoulder pain with reported pain level 6/10. Patient managing pain with tylenol PRN and heat/ice    VITALS: Within parameters     UE ROM/STRENGTH: Right hand dominant. LUE strength and AROM WFL . 35 right shoulder flexion in sitting and IR/ER WNLRight UE GI/GU: NO Urinary incontinence. No reported bowel issues. Patient using RUE for dominant functional use with no issues.     TRANSFERS/MOBILITY: Tub  transfers; Independent with GB and tubseat. Toilet transfers:Independent. Bed mobility: Independent. Patient is ambulating within the home using no device,   ADLs: Grooming: Independent. Sponge bathing: Independent Dressing; Independent . Showering: Independent with GB and tubseat.   IADLs: Independent for meal preparation   ACTIVITY TOLERANCE: Good for mobility, ADLs    OT Education/Training has been provided for energy conservation, fall prevention strategies/safety techniques with ADLs, equipment recommendation/use, UE ther ex/HEP, transfer training and pain management techniques. Patient demonstrates good understanding via teachback. Occupational therapy teaching has been completed and she has met her Occupational Therapy goals. Patient in agreement with discharge plan.

## 2023-11-14 ENCOUNTER — Encounter: Primary: Internal Medicine

## 2023-11-18 ENCOUNTER — Encounter: Primary: Internal Medicine

## 2023-11-19 ENCOUNTER — Encounter: Primary: Internal Medicine

## 2023-11-19 ENCOUNTER — Ambulatory Visit
Admit: 2023-11-19 | Discharge: 2023-11-19 | Payer: MEDICARE | Attending: Physician Assistant | Primary: Internal Medicine

## 2023-11-19 DIAGNOSIS — G629 Polyneuropathy, unspecified: Secondary | ICD-10-CM

## 2023-11-19 NOTE — Progress Notes (Signed)
 Neurosurgery Office Visit Note    Patient:  Molly Jensen    DOB:  [birth year]  MRN:  [id number]   Encounter Date: 11/19/2023    Reason for Visit: Falls   PCP: Verne Grain, MD     History of Present Illness    Molly Jensen is a 73 y.o. female returns today with two primary concerns.   First of which is that nobody has been able to tell her why she was following for few weeks earlier in the year.  She had an extensive workup for this inpatient without any clear etiology.  She was told that it was her lumbar stenosis while in the hospital.    Secondly, she endorses severe stabbing pain and numbness in her ankles and feet bilaterally.  This has been present for years but much worse over the past few years.  She has difficulty sleeping at night.  This primarily only occurs when she is lying in bed.  Denies any paresthesias proximal to the ankles.  She does have chronic back pain going down the left leg for the past 20 years, there is been no change in this.  Her feet symptoms are much more problematic than her back and leg pain.        10/08/23 Molly Jensen underwent L5-S1 discectomy nearly 20 years ago in [place].  After this operation she regained her ability to ambulate but has had persistent back pain as well as left hip and thigh pain.  She also woke up with "neuropathy" in her feet bilaterally.  Each of the symptoms have been present for decades.  The back and leg pain have remained the same over the years, but neuropathy has worsened.  The worsening is described by the symptoms now involving her ankles, this is described as a stabbing knife sensation.  She has been on tramadol and gabapentin for this neuropathy symptoms for quite a while.  Her neuropathy is much worse than her back pain.  In January of this year she sustained 4 falls in a period of 2 weeks.  She cannot explain why she fell and afterwards she had difficulty controlling her legs before coming to the hospital.  Since she has been home she has not fallen  and she has no distrust in her legs.  She also denies any distress in her legs prior to these falls.  Patient was told that her lumbar stenosis was the cause for her falls and she expected today's visit to be a surgical discussion          Medications  Current Outpatient Medications   Medication Instructions    ALPRAZolam (XANAX) 0.5 mg, oral, Nightly PRN    citalopram (CELEXA) 20 mg, oral, Daily    docusate sodium (COLACE) 100 mg, oral, 2 times daily    gabapentin (NEURONTIN) 100 mg, oral, Nightly    losartan (COZAAR) 50 mg, oral, Daily    oxybutynin XL (DITROPAN-XL) 10 mg, oral, Daily, Do not crush, chew, or split.    sennosides (Senokot) 8.6 mg tablet 1 tablet, oral, As needed    traMADol (ULTRAM) 100 mg, oral, Daily       Allergies  Allergies   Allergen Reactions    Latex Rash     Red bumps in mouth       Past Medical History  Past Medical History:   Diagnosis Date    Degenerative joint disease     Hypertension         Past Surgical History  Past Surgical History:   Procedure Laterality Date    CHOLECYSTECTOMY      CTA CHEST W AND WO CONTRAST  08/12/2023    CTA CHEST W AND WO CONTRAST 08/12/2023 [facility] CT    HYSTERECTOMY         Social History  Social History     Tobacco Use    Smoking status: Never    Smokeless tobacco: Never   Vaping Use    Vaping status: Never Used   Substance Use Topics    Alcohol use: Never    Drug use: Never       Family History  Family History   Problem Relation Name Age of Onset    No Known Problems Mother      No Known Problems Father         Review of Systems:  A 12-point review of systems was completed and negative unless otherwise reported in the HPI.    Physical Examination  Visit Vitals  BP (!) 132/97   Pulse 80   Ht 1.575 m   Wt 76.2 kg   BMI 30.73 kg/m   OB Status Hysterectomy   BSA 1.83 m        General:  NAD, alert, oriented, speaking clearly, following commands well.    Respiratory: Breathing comfortably.  Neuro exam: PERRL, EOMI, TML, FS, no drift  Head and neck: Neck supple,  head is atraumatic.  Motor: Full strength in upper and lower extremities.  Gait: Normal      Assessment/Plan:    There are no diagnoses linked to this encounter.         Molly Jensen is a 73 y.o. with history of chronic back and left leg pain for 20 years following a laminectomy. These are currently well-controlled and that are not her primary concern.  Her chief complaint remains severe burning numbness in the bilateral feet and ankles.  This is worse in the evening hours.  EMG does not reveal evidence of neuropathy.  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  I do not think that her lumbar stenosis at L4-5 explains this neuropathic pain in her feet.  She can undergo a diagnostic epidural injection at L4-5 with pain management.  If this injection helps her feet, could consider decompression in the future, however this is thought to be unlikely.  She will also go see neurology for formal diagnosis of neuropathy.  In addition she will see neuropathy to see if they can shed any further light on why she had fallen several times earlier in the year.      Dorthula Perfect, PA

## 2023-11-25 ENCOUNTER — Encounter: Primary: Internal Medicine

## 2023-11-26 ENCOUNTER — Encounter

## 2023-11-28 ENCOUNTER — Ambulatory Visit
Admit: 2023-11-28 | Discharge: 2023-11-28 | Attending: Student in an Organized Health Care Education/Training Program | Primary: Internal Medicine

## 2023-11-28 DIAGNOSIS — R2681 Unsteadiness on feet: Secondary | ICD-10-CM

## 2023-11-28 NOTE — Progress Notes (Signed)
 Neurosurgery Office Visit Note    Patient:  Molly Jensen    DOB:  Aug 06, 1951  MRN:  [id number]   Encounter Date: 11/28/2023    Reason for Visit: lumbar stenosis  PCP: Molly Grain, MD     History of Present Illness  This is a 73 y.o. female presenting for evaluation of bilateral foot pain and lumbar stenosis.    She has a history 20 years ago of L5-S1 discectomy. Prior to this, she was having severe radicular pain which required her to use a wheelchair. She notes improvement of pain and was ambulatory again. 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 She did develop low back pain afterwards and paresthesias of the feet requiring tramadol and gabapentin. She has been on this regimen since.     Currently, her main issue is a sense of imbalance and fear of falls. She has had several falls that led to an admission to the hospital in January. She has multiple orthopedic injuries. During these falls, she states that she cannot feel her legs. The fall preceding the admission rendered her unable to move on the floor for several hours, during which she worked to crawl on the floor to a phone. She ambulates with a cane since her admission. Has not had falls since she returned home, but she has also majorly scaled back her activity level. Generally feels imbalanced for a couple years.     She notes fine motor deficits of the hands - clumsiness with buttoning buttons. Trouble opening jars. She does note some neck pain that started a few years ago. This has been worsening and it does wake her up at night.    She also notes low back pain and burning pain of the feet and ankles. These symptoms are the same as what started after surgery 20 years ago, but have progressively worsened and can be very uncomfortable. She feels it most significantly when she lays down. Gets better when she is upright and walking.     I personally and independently reviewed the imaging. My interpretation is noted in the Imaging Review  section.    Medications  Current Outpatient Medications   Medication Instructions    ALPRAZolam (XANAX) 0.5 mg, oral, Nightly PRN    citalopram (CELEXA) 20 mg, oral, Daily    docusate sodium (COLACE) 100 mg, oral, 2 times daily    gabapentin (NEURONTIN) 100 mg, oral, Nightly    losartan (COZAAR) 50 mg, oral, Daily    oxybutynin XL (DITROPAN-XL) 10 mg, oral, Daily, Do not crush, chew, or split.    sennosides (Senokot) 8.6 mg tablet 1 tablet, oral, As needed    traMADol (ULTRAM) 100 mg, oral, Daily       Allergies  Allergies   Allergen Reactions    Latex Rash     Red bumps in mouth       Past Medical History  Past Medical History:   Diagnosis Date    Cervical stenosis of spinal canal     Closed fracture of right proximal humerus     Degenerative joint disease     Hypertension      Hypertension      Neuropathy     Prediabetes     Recurrent falls        Past Surgical History  Past Surgical History:   Procedure Laterality Date    CHOLECYSTECTOMY      CTA CHEST W AND WO CONTRAST  08/12/2023    CTA CHEST  W AND WO CONTRAST 08/12/2023 [facility] CT    HYSTERECTOMY         Social History  Social History     Tobacco Use    Smoking status: Never    Smokeless tobacco: Never   Vaping Use    Vaping status: Never Used   Substance Use Topics    Alcohol use: Never    Drug use: Never       Family History  Family History   Problem Relation Name Age of Onset    No Known Problems Mother      No Known Problems Father         Review of Systems:  A 12-point review of systems was completed and negative unless otherwise reported in the HPI.    Physical Examination  Visit Vitals  BP (!) 174/114   Pulse 76   Ht 1.575 m   Wt 75.3 kg   BMI 30.36 kg/m   OB Status Hysterectomy   BSA 1.82 m       GENERAL: Well-developed, well-nourished. NAD.      ---  Focused Neurological Examination      -      MENTAL STATUS: AOX3.      -      CRANIAL NERVES: II-XII grossly intact.      -      MOTOR: Normal tone. Strength 5/5 in all muscle groups in bilateral upper  extremities and bilateral lower extremities with the following exceptions: NO EXCEPTIONS.      -      REFLEXES: 2+ throughout and symmetric.      -      SENSORY: Numbness to pinprick bilateral feet extending halfway up the shin.      -      GAIT:  Slow, cautious. Ambulates with cane. Romberg grossly positive. Tandem grossly abnormal.        -      INCISION: None      -      No tenderness to palpation along the back with the following exceptions: NO EXCEPTIONS.    Imaging Review  Scans were performed at [facility].   MRI: lumbar spine 09/18/23. Prior L L5-S1 hemilaminectomy defect. Focal severe canal stenosis at L4-5 on basis of concentric degenerative changes. L5-S1 severe disc space collapse and bilateral lateral recess stenosis.    Assessment/Plan:      In summary, Molly Jensen is a 73 y.o. presenting with a constellation of symptoms, most importantly gait instability and multiple falls.    She was seen in the emergency room and admitted to the hospital after several falls at the end of January.  Imaging performed at this time included MRI of the lumbar spine which demonstrated focal severe canal stenosis at L4-5 on the basis of concentric degenerative changes.  She has since returned home, and has not had further falls, but notes being very cautious about her gait and is generally fearful of falling.  She has been ambulating with a cane regularly.  She also notes manual dexterity deficits and worsening weakness.  This constellation of symptoms raises concern for cervical myelopathy.  I am ordering an MRI of the cervical spine to assess for canal stenosis to explain this.  For thorough workup of gait instability which is caused significant falls with orthopedic injury, I am also ordering MRI of the thoracic spine and MRI of the brain with and without contrast.  Lastly, I will place a referral to Dr. Theron Jensen  who can extend the workup of gait instability concurrently while I evaluate structural causes.    She does  separately note bilateral foot burning pain, consistent with neuropathic pain.  She also has significant numbness in this region.  This is consistent with neuropathic pain.  This may very well be on the basis of her severe lumbar stenosis, though interestingly she notes that her symptoms started after her surgery 20 years ago, at which time I assume she did not have the severe degree of stenosis (although admittedly do not have the preoperative MRI from 20 years ago to compare).  This argues that the symptoms may be the sequela of her index surgery.  She also has low back pain, which is likely the basis of her disc space collapse at L5-S1 and general spondylosis in the lumbar spine.  She may ultimately benefit from focal lumbar decompression, and consideration would be made for spinal cord stimulator pending the trajectory of her pain, however the clear priority at this time is the workup of her gait instability.  There may be a component of cervical stenosis playing a role in the development of the symptoms as well.    I will see her back in 1 month for close follow-up.    Thank you for this consultation and for allowing me to participate in the care of your patient.    Disposition/Follow-up  Next clinic visit: 1 month    On the day of the encounter I spent 60 minutes on the total patient encounter, including time reviewing tests and/or medical records, taking a history, performing a medically appropriate examination and/or evaluation, counseling and educating the patient/family/caregiver, completing clinical documentation in the electronic or other health record, independently interpreting test results and communicating results to the patient/family/caregiver and care coordination including communication with referring provider(s).

## 2023-12-10 ENCOUNTER — Ambulatory Visit
Admit: 2023-12-10 | Discharge: 2023-12-10 | Payer: MEDICARE | Attending: Critical Care Medicine | Primary: Internal Medicine

## 2023-12-10 DIAGNOSIS — J9811 Atelectasis: Secondary | ICD-10-CM

## 2023-12-10 MED ORDER — Breo Ellipta 100-25 mcg/dose inhaler
100-25 | Freq: Every day | RESPIRATORY_TRACT | 1 refills | 30.00000 days | Status: DC
Start: 2023-12-10 — End: 2024-01-01

## 2023-12-10 NOTE — Progress Notes (Signed)
 [facility] - Pulmonary Clinic Note     Reason for appointment:  New patient - Abnormal CT chest    History of present illness:  Patient is 73 y.o.  I had the pleasure of seeing Molly Jensen in our pulmonary clinic today.  She has past medical history of diabetes, hypertension, neuropathy and severe spinal canal stenosis.  She is referred to us  for abnormal CAT scan.  In September 2024 she had strep throat when she was visiting [place], she was given antibiotics. In October 2024 she tested positive for COVID-19 infection.  Subsequently she was given multiple antibiotics including azithromycin, doxycycline and Augmentin on outpatient basis.  She had persistent pulmonary symptoms.  Subsequently in December 2024 she tested positive for influenza B and was hospitalized briefly and was discharged on Tamiflu.  About a week or so later she returned to the hospital with frequent falls, was admitted for about a week for rib fractures and syncope leading to falls.  Her sputum cultures tested positive for Klebsiella at rehab prior and was given a course of levaquin.  About 3 weeks later she again went to the hospital for the third time in 08/2023 and was hospitalized for few days for severe spinal canal stenosis and recurrent falls.    Today she tells me that her dyspnea is getting better but not back to baseline.  She reports dyspnea when ambulating on ground level at long distance.  She reports intermittent cough, dry for the most part.  She is using PRN ventolin which seems to help. She denies any childhood history of asthma.  She denies any prior history of pulmonary infections, pulmonary symptoms or pulmonary illnesses.    [Following history obtained on initial encounter:  Cigarette Smoking: Never smoker   Inhalation of cigars/marijuana/vape/e-cigs/illicit drugs: Denies  Pets: Denies  Recent travel history: [place] 05/2023.   Occupational/Inhalational exposures: Denies  Family history of pulmonary  diseases: Denies  Lives: by herself]    Workup:  PFTs 09/2023: Mild restriction, TLC 77% -1.79, normal DLCO. [BMI 32].    CT chest 08/2023: IMPRESSION: Right posterolateral ninth and 10th rib fractures.    CTA chest 07/2023: IMPRESSION:  1.  Mild nonspecific diffuse interstitial prominence, which could represent edema or atypical pneumonia in the appropriate clinical setting.  No evidence of pulmonary embolus.  2.  Other incidental findings as above.    Echo 07/2023: Interpretation Summary  LVEF = 60%.  The left ventricular wall motion is normal.  There is trace/physiologic mitral regurgitation present  There is trace aortic regurgitation.  The right ventricle is normal in size and function.     Current Medications: Current Medications[1]    Past Medical History:  Problem List:  2025-01: Fall, initial encounter  2025-01: Multiple falls  2025-01: Syncope, unspecified syncope type  2025-01: Syncope and collapse  2024-12: Influenza B  2024-06: Primary osteoarthritis of right knee  2024-06: Primary osteoarthritis of left knee  Hypertension   Degenerative joint disease     Past Surgical History:  Surgical History[2]    Family History:  Family History[3]    Social History:  Social History     Socioeconomic History    Marital status: Widowed     Spouse name: Not on file    Number of children: Not on file    Years of education: Not on file    Highest education level: Not on file   Occupational History    Not on file   Tobacco  Use    Smoking status: Never    Smokeless tobacco: Never   Vaping Use    Vaping status: Never Used   Substance and Sexual Activity    Alcohol use: Never    Drug use: Never    Sexual activity: Defer   Other Topics Concern    Not on file   Social History Narrative    Not on file     Social Determinants of Health     Financial Resource Strain: Low Risk  (09/18/2023)    Overall Financial Resource Strain (CARDIA)     Difficulty of Paying Living Expenses: Not hard at all   Food Insecurity: Unknown (09/18/2023)     Hunger Vital Sign     Worried About Running Out of Food in the Last Year: Never true     Ran Out of Food in the Last Year: Not on file   Recent Concern: Food Insecurity - Food Insecurity Present (08/12/2023)    Hunger Vital Sign     Worried About Running Out of Food in the Last Year: Sometimes true     Ran Out of Food in the Last Year: Not on file   Transportation Needs: No Transportation Needs (11/13/2023)    OASIS A1250: Transportation     Lack of Transportation (Medical): No     Lack of Transportation (Non-Medical): No     Patient Unable or Declines to Respond: No   Physical Activity: Not on file   Stress: Not on file   Social Connections: Feeling Socially Integrated (11/13/2023)    OASIS D0700: Social Isolation     Frequency of experiencing loneliness or isolation: Never   Intimate Partner Violence: Not on file   Housing Stability: Low Risk  (10/04/2023)    Housing Stability Vital Sign     Unable to Pay for Housing in the Last Year: No     Number of Times Moved in the Last Year: 1     Homeless in the Last Year: No     Allergies:  Latex    Review of Systems:  ROS pertinent to pulmonary are as per HPI. All other systems are reviewed and negative.    Vitals:  BP 95/69 (BP Location: Left arm)   Pulse 78   Temp 36.7 C (98.1 F)   Wt 74.8 kg   SpO2 97%   BMI 30.18 kg/m           Physical Exam:  Gen: NAD, sitting comfortably  HEENT: PERRL, moist mucus membranes, Mallampati 3  CV: RRR, S1 and S2  Pulm: Breathing comfortably, CTAB  Abd: soft, non tender, non distended  Ext: no edema, no cyanosis noted  Neuro: Alert, conversing well  Psych: Calm, cooperative          Assessment and Plan:  Diagnoses and all orders for this visit:    Atelectasis  -     CT CHEST WO CONTRAST; Future    Dyspnea on exertion    Recurrent infections    Opacity of lung on imaging study  -     CT CHEST WO CONTRAST; Future    Chronic cough  -     Breo Ellipta 100-25 mcg/dose inhaler; Inhale 1 puff in the morning. Rinse mouth after every  use.       1) Lung opacities  CTA chest in 07/2023 during hospitalization for influenza B showed no PE, showed mild bibasilar reticular opacities. No prior CT chests to compare.  CT chest in 08/2023  during hospitalization for falls/rib fractures showed worsened bilateral bibasilar reticular opacities and atelectasis.   PFTs 09/2023: Mild restriction, TLC 77% -1.79, normal DLCO. [BMI 32].  She has had recurrent infections and hospitalization, this is likely a consequence of that.   Plan:  - I will repeat HRCT in June 2025, about 6 months after last hospitalization to assess for parenchymal disease or ILD.    2)  Recurrent infection/Cough  In September 2024 she had strep throat when she was visiting [place], she was given antibiotics.   In October 2024 she tested positive for COVID-19 infection.    Subsequently she was given multiple antibiotics including azithromycin, doxycycline and Augmentin on outpatient basis.  She had persistent pulmonary symptoms.    In December 2024 she tested positive for influenza B and was hospitalized briefly and was discharged on Tamiflu.   In Jan 2025 her sputum cultures showed Klebsiella at rehab and was given a course of levaquin.    She denies any prior history of recurrent infections, she is a non smoker.  She reports persistent intermittent dry cough, ventolin PRN helps.   Plan:  - Start low dose ICS/LABA, breo-100 for post infectious cough. Went over inhaler technique, advised to rinse mouth with water after every use. Advised to use daily until cough resolves, then she can stop.   - Continue PRN ventolin.     Follow up:  July 2025     I spent 62 minutes reviewing medical records, obtaining history, performing physical exam, independently interpreting results, communicating results and counseling/educating the patient/family/caregiver, documenting in the EMR and coordinating care with other providers and staff members. This time excludes other billable services.      Therisa Flatten  MD  Pulmonary Physician   [facility]     Disclaimer: Part of the note has been dictated using voice recognition software and hence may contain errors that may have been overlooked. If you are the patient reviewing the medical records, please note that this documentation is written by a physician to document clinical course in the most efficient way possible often using abbreviations and for other medical professionals but are not specifically written with patient consumption in mind.          [1]   Current Outpatient Medications:     ALPRAZolam (Xanax) 0.5 mg tablet, Take 0.5 mg by mouth if needed at bedtime for anxiety. Indications: anxious, Disp: , Rfl:     citalopram (CeleXA) 20 mg tablet, Take 1 tablet (20 mg) by mouth once daily., Disp: 30 tablet, Rfl: 0    docusate sodium (Colace) 100 mg capsule, Take 1 capsule (100 mg) by mouth twice daily., Disp: 60 capsule, Rfl: 0    gabapentin (Neurontin) 100 mg capsule, Take 1 capsule (100 mg) by mouth at bedtime., Disp: 30 capsule, Rfl: 0    losartan (Cozaar) 50 mg tablet, Take 50 mg by mouth once daily. Indications: high blood pressure (Patient taking differently: Take 100 mg by mouth in the morning.), Disp: , Rfl:     oxybutynin XL (Ditropan-XL) 10 mg 24 hr tablet, Take 10 mg by mouth once daily. Do not crush, chew, or split., Disp: , Rfl:     sennosides (Senokot) 8.6 mg tablet, Take 1 tablet by mouth if needed for constipation., Disp: , Rfl:     traMADol (Ultram) 50 mg tablet, Take 100 mg by mouth 1 (one) time each day. Indications: neuropathic pain, Disp: , Rfl:     Ventolin  HFA 90 mcg/actuation inhaler, Inhale 1 puff every 4 (four) hours if needed., Disp: , Rfl:     Breo Ellipta  100-25 mcg/dose inhaler, Inhale 1 puff in the morning. Rinse mouth after every use., Disp: 30 each, Rfl: 1  [2]   Past Surgical History:  Procedure Laterality Date    CHOLECYSTECTOMY      CTA CHEST W AND WO CONTRAST  08/12/2023    CTA CHEST W AND WO CONTRAST  08/12/2023 [facility] CT    HYSTERECTOMY     [3]   Family History  Problem Relation Name Age of Onset    No Known Problems Mother      No Known Problems Father

## 2023-12-11 ENCOUNTER — Inpatient Hospital Stay: Admit: 2023-12-11

## 2023-12-11 ENCOUNTER — Inpatient Hospital Stay: Admit: 2023-12-11 | Payer: MEDICARE

## 2023-12-11 DIAGNOSIS — R2681 Unsteadiness on feet: Secondary | ICD-10-CM

## 2023-12-11 MED ORDER — gadoterate meglumine (Dotarem/Clariscan) 0.5 mmol/mL injection 15 mL
0.5 | Freq: Once | INTRAVENOUS | Status: AC
Start: 2023-12-11 — End: 2023-12-11
  Administered 2023-12-11: 12:00:00 15 mL via INTRAVENOUS

## 2023-12-11 MED FILL — GADOTERATE MEGLUMINE 0.5 MMOL/ML (376.9 MG/ML) INTRAVENOUS SOLUTION: 0.5 0.5 mmol/mL | INTRAVENOUS | Qty: 15

## 2023-12-16 ENCOUNTER — Ambulatory Visit: Admit: 2023-12-16 | Discharge: 2023-12-16 | Primary: Internal Medicine

## 2023-12-16 DIAGNOSIS — I77810 Thoracic aortic ectasia: Secondary | ICD-10-CM

## 2023-12-16 NOTE — Progress Notes (Signed)
 MVC OFFICE CONSULT NOTE    HPI: Molly Jensen is a 73 y.o. female who is being seen in consultation.     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    No CP, or SOB. Occasional brief palpitations that she attributes to stress. No lightheadedness or dizziness. At night toes have some pitting edema.    Family history:  No fam hx of aneurysm or SCD  Father - multiple MIs, ultimately passed from MI at age 46, first MI at age 18  Mother - heart issues but never any MI. Passed from complications of spinal stenosis at age 55  2 sisters w DM and HTN    Social history:  Never smoker. No alcohol.    PRIOR CARDIAC HISTORY:   Specialty Problems          Cardiology Problems    Hypertension       PMH: Patient  has a past medical history of Cervical stenosis of spinal canal, Closed fracture of right proximal humerus, Degenerative joint disease, Hypertension, Hypertension, Neuropathy, Prediabetes, and Recurrent falls.     MEDICATIONS:   Current Medications[1]     ALLERGY: Allergies[2]    SOCIAL HISTORY:  reports that she has never smoked. She has never used smokeless tobacco. She reports that she does not drink alcohol and does not use drugs.     FAMILY HISTORY: family history includes No Known Problems in her father and mother.    ROS: ROS  See HPI    EXAM:   Vitals:    12/16/23 1057   BP: (!) 156/100   Pulse: 70   SpO2: 98%   Weight: 79.4 kg (175 lb)   Height: 1.575 m (5\' 2" )     Physical Exam  General: well-developed, well-nourished, in no acute distress  HEENT: head normocephalic atraumatic, sclera anicteric, conjunctivae clear.  No xanthelasmas.  Neck: supple. No JVD noted.  No carotid bruits.  Pulm: lungs CTAB, no wheezing, rales, or rhonchi  Cardiac: RRR, S1  and S2 normal, no murmurs, rubs, or gallops.  1+ bilateral radial pulses.  Extremities: no BLE peripheral edema  Neuro: AAOx3  Psych: normal mood and affect     DATA:     EKG: NSR     Lab Results   Component Value Date    GLUCOSE 142 (H) 09/20/2023    CALCIUM 9.5 09/20/2023    NA 140 09/20/2023    K 3.7 09/20/2023    CO2 24 09/20/2023    CL 109 09/20/2023    BUN 21 09/20/2023    CREATININE 0.91 09/20/2023     No results found for: "CHOL", "HDL", "LDLCALC", "TRIG", "CHOLHDL"  Lab Results   Component Value Date    WBC 9.2 09/20/2023    HGB 12.5 09/20/2023    HCT 37.8 09/20/2023    MCV 88.5 09/20/2023    PLT 325 09/20/2023     TTE 08/13/23 - Normal. EF 60%. Asc ao 3.6 cm, Ao root 3.3cm.  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 CAD noted.  CXR 10/03/23 - Tortuous aorta with atherosclerotic calcifications.       IMP/PLAN:    Dilated  ascending aorta  Measured normal on TTE 07/2023, but up to 4.2cm on CT 08/2023.  -ongoing surveillance w CT given discrepancy in measurements between TTE and CT - next due 02/2024, CT chest already ordered by pulm.  -BP control as below    HTN  BP today 156/100. BP was 96/69. Home BPs 120-130s.  -continue losartan 100mg  daily for now, reporting fatigue on this. Would plan to drop to 50mg  and replace other 50 w alternative but first need verification of home machine. BP check w RN in 1-2 wks    HLD  No results found for: "LDLCALC".  -start statin given CAD on CT chest pending result of lipid panel  -lipid panel today    Orders Placed This Encounter   Procedures    Lipid panel    ECG 12 lead        No orders of the defined types were placed in this encounter.         Follow up 2wks BP check w RN, 2mos w NP      Josef Tourigny Raiford Bunnell, MD          [1]   Current Outpatient Medications:     ALPRAZolam (Xanax) 0.5 mg tablet, Take 0.5 mg by mouth if needed at bedtime for anxiety. Indications: anxious, Disp: , Rfl:     citalopram (CeleXA) 20 mg  tablet, Take 1 tablet (20 mg) by mouth once daily., Disp: 30 tablet, Rfl: 0    docusate sodium (Colace) 100 mg capsule, Take 1 capsule (100 mg) by mouth twice daily., Disp: 60 capsule, Rfl: 0    gabapentin (Neurontin) 100 mg capsule, Take 1 capsule (100 mg) by mouth at bedtime. (Patient taking differently: Take 400 mg by mouth at bedtime.), Disp: 30 capsule, Rfl: 0    losartan (Cozaar) 50 mg tablet, Take 50 mg by mouth once daily. Indications: high blood pressure, Disp: , Rfl:     traMADol (Ultram) 50 mg tablet, Take 100 mg by mouth 1 (one) time each day. Indications: neuropathic pain, Disp: , Rfl:     Ventolin HFA 90 mcg/actuation inhaler, Inhale 1 puff every 4 (four) hours if needed., Disp: , Rfl:     Breo Ellipta 100-25 mcg/dose inhaler, Inhale 1 puff in the morning. Rinse mouth after every use. (Patient not taking: Reported on 12/16/2023), Disp: 30 each, Rfl: 1  [2]   Allergies  Allergen Reactions    Latex Rash     Red bumps in mouth

## 2023-12-17 ENCOUNTER — Encounter: Attending: Rheumatology | Primary: Internal Medicine

## 2023-12-17 LAB — LIPID PANEL
Cholesterol: 143 mg/dL (ref 100–199)
HDL Cholesterol: 56 mg/dL (ref >39)
LDLc Calc (NIH): 69 mg/dL (ref 0–99)
Non-HDL Chol: 87 mg/dL (ref 0–129)
Triglycerides: 100 mg/dL (ref 0–149)
VLDLc Calc: 18 mg/dL (ref 5–40)

## 2023-12-17 MED ORDER — pravastatin (Pravachol) 10 mg tablet
10 | ORAL_TABLET | Freq: Every day | ORAL | 3 refills | 30.00000 days | Status: AC
Start: 2023-12-17 — End: 2024-12-16

## 2023-12-27 ENCOUNTER — Encounter: Attending: Student in an Organized Health Care Education/Training Program | Primary: Internal Medicine

## 2023-12-31 ENCOUNTER — Institutional Professional Consult (permissible substitution)
Admit: 2023-12-31 | Discharge: 2023-12-31 | Payer: MEDICARE | Attending: Student in an Organized Health Care Education/Training Program | Primary: Internal Medicine

## 2023-12-31 DIAGNOSIS — M48062 Spinal stenosis, lumbar region with neurogenic claudication: Secondary | ICD-10-CM

## 2023-12-31 MED ORDER — gabapentin (Neurontin) 300 mg capsule
300 | ORAL_CAPSULE | Freq: Two times a day (BID) | ORAL | 1 refills | 30.00000 days | Status: DC
Start: 2023-12-31 — End: 2024-03-04

## 2023-12-31 NOTE — Progress Notes (Signed)
 [facility]  General Neurology Clinic    Reason for the visit: Molly Jensen is 73 y.o. right handed female  who is here for new evaluation and treatment of gait impairment.    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 She was discharged home afterwards, she noticed generalized weakness while walking but no feeling of unsteadiness.    Patient was readmitted to [facility] 08/21/2023 - 08/26/2023 after syncope. She does not recall having dizziness or lightheadedness, she collapsed while walking and going from one room to another, she lost consciousness at onset of syncope and before the fall, she suffered right rib fracture, she also broke right humerus and was given sling without surgery, she lost consciousness for seconds, no loss of sphincter control with no postictal confusion.   - She was discharged to rehab for 1 week and was discharged home afterwards.    Patient was admitted to [facility] 09/17/2023 - 09/20/2023 after another fall, she fell while in bathroom, she felt her leg was weak but not dizziness or lightheadedness. She suffered right knee injury and was managed conservatively, MRI lumbar spine showed severe lumbar spinal stenosis and was given dexamethasone with improvement of her muscle strength.     Regarding her gait, she feel her legs are tired and give up quickly while walking. She feel walking downhill is easier than walking uphill. She does not feel unsteady on uneven surfaces.     Patient is being followed by rheumatology at [facility] regarding positive ANA and osteoporosis.  She does not meet criteria for SLE.    Interval  history:  History was provided by the patient, who is a fair historian. She was unaccompanied to the visit.   - Orthostatic vitals today: lying down BP 124/76 & HR 66, standing 1 min BP 104/64 & HR 92, standing 3 min BP 125/74 & HR 76.     Comprehensive review of other neurological symptoms:  Symptom Yes No Description   Cognitive impairment []  [x]     Sleep impairment  [x]  []  Difficulty falling asleep, seems to be related to bilateral feet pain, which also interrupt her sleep. No restless leg symptoms.  If sleep is interrupted, she occasionally has difficulty going back to sleep.    Depression/Anxiety  []  [x]     Headache []  [x]     Autonomic symptoms [x]  []  She had syncope in January 2025 without prodromal symptoms.  She has dry eyes. No dry mouth or sweating issues.    Visual symptoms, including hallucinations []  [x]     Brainstem symptoms []  [x]  No diplopia, dysphagia, dysarthria  No facial weakness or numbness or tingling  No tinnitus, hearing impairment or vertigo   Motor symptoms (weakness, spasticity, cramps) [x]  []  Fatigable weakens sin both legs   Bowel/bladder issues [x]  []  She has urinary urgency with urge incontinence. She was on oxybutynin and switched to mirabegron but couldn't afford it.   She has chronic constipation.    Sensory symptoms (Numbness/paraesthesia) [x]  []  Constant numbness and tingling in bilateral toes & then spread to involve both feet and ankles. It started after lumbar spine back surgery in 2004.   During 2025, she  started noticing numbness and tingling in left thumb.    Tremor []  [x]        Medical Review of Systems:  As per above in HPI. A 10-point ROS is otherwise negative.     Past history:  Medical History[1]   Surgical History[2]    Social history:  Social History     Socioeconomic History    Marital status: Widowed     Spouse name: Not on file    Number of children: Not on file    Years of education: Not on file    Highest education level: Not on file   Occupational History    Not  on file   Tobacco Use    Smoking status: Never    Smokeless tobacco: Never   Vaping Use    Vaping status: Never Used   Substance and Sexual Activity    Alcohol use: Never    Drug use: Never    Sexual activity: Defer   Other Topics Concern    Not on file   Social History Narrative    Not on file     Social Determinants of Health     Financial Resource Strain: Low Risk  (09/18/2023)    Overall Financial Resource Strain (CARDIA)     Difficulty of Paying Living Expenses: Not hard at all   Food Insecurity: Unknown (09/18/2023)    Hunger Vital Sign     Worried About Running Out of Food in the Last Year: Never true     Ran Out of Food in the Last Year: Not on file   Recent Concern: Food Insecurity - Food Insecurity Present (08/12/2023)    Hunger Vital Sign     Worried About Running Out of Food in the Last Year: Sometimes true     Ran Out of Food in the Last Year: Not on file   Transportation Needs: No Transportation Needs (11/13/2023)    OASIS A1250: Transportation     Lack of Transportation (Medical): No     Lack of Transportation (Non-Medical): No     Patient Unable or Declines to Respond: No   Physical Activity: Not on file   Stress: Not on file   Social Connections: Feeling Socially Integrated (11/13/2023)    OASIS D0700: Social Isolation     Frequency of experiencing loneliness or isolation: Never   Intimate Partner Violence: Not on file   Housing Stability: Low Risk  (10/04/2023)    Housing Stability Vital Sign     Unable to Pay for Housing in the Last Year: No     Number of Times Moved in the Last Year: 1     Homeless in the Last Year: No      Occupation: Retired, 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  Neck: Supple  Respiratory: Non labored breathing    Neurological exam:   Mental Status: Awake, oriented to person, place, time. Normal fund of knowledge. Fluent speech and comprehension.    Cranial Nerves: Visual fields full to confrontation. PERRL without evidence of APD. EOMI without signs of  intranuclear ophthalmoplegia or nystagmus. V1-V3 intact to light touch bilaterally. Face and nasolabial folds symmetric. Hearing intact to finger rub, palate and tongue midline. Shoulder shrug is full strength bilaterally.    Motor Exam: Bulk is normal. Tone is normal in bilateral upper & lower extremities. There is no evidence of tremor, no pseudoathetosis. Pronator drift negative. Strength is full in bilateral upper and lower extremities.     Sensation: Sensation to light touch and pinprick is intact in all extremities. Vibration is mildly impaired in both feet. Romberg is negative    Coordination: Finger-to-nose shows no signs of dysmetria. Finger-tapping is slower in right hand with mild decrement.     Deep tendon reflexes: (R/L): Brachioradialis 2+/2+, Biceps 2+/2+, Triceps 2+/2+, Patellar 1+/1+ without crossed adduction, negative Hoffman sign bilaterally.     Gait: Slightly wide based gait, normal step and stride length, no spasticity, normal cadence, and arm swing. Unable to tandem walk.    Relevant laboratory and imaging data (my direct review):  Serum studies:  Test Date Notes   ESR  Lab Results   Component Value Date    SEDRATE 22 08/12/2023       CRP  Lab Results   Component Value Date    CRP 1.01 (H) 08/12/2023       ANA  Lab Results   Component Value Date    ANA Positive (A) 08/13/2023       HbA1c   Lab Results   Component Value Date    HGBA1C 5.8 (H) 08/21/2023       Vitamin b12  Lab Results   Component Value Date    VITAMINB12 544 09/17/2023       TSH  Lab Results   Component Value Date    TSH 1.740 09/17/2023       SPEP with IFE  No results found for: "SPEP", "UPEP"       Imaging:  MRI brain W/WO 12/11/2023:  Minimal deep hemispheric white matter signal alteration, likely minimal microangiopathic change unremarkable for age.  No acute cerebral infarct, hemorrhage or mass.  No evidence of any normal pressure hydrocephalus anatomy.  Suggestion of cervical spinal stenosis with probable cord compression at C4-5. See separate report of same date.    MRI cervical spine W/WO 12/11/2023:  Multilevel degenerative changes as described with minimal spinal cord compression at C4-5 without any definite edema or myelomalacia.  Moderate left C4, severe right C5 and moderate bilateral C6 nerve root canal narrowing. There are lesser degrees of foraminal stenosis elsewhere as listed above.    MRI thoracic spine W/WO 12/11/2023:  No severe spinal or foraminal stenosis in the thoracic spine. No suspicious enhancement.  Exaggerated thoracic kyphosis and broad-based mild dextroconvex curvature in the midthoracic spine.  A concurrently performed dedicated cervical spine MRI is reported separately.    MRI lumbar spine WO 09/18/2023:  1.  Severe spinal canal stenosis with cauda equina nerve root compression at L4-L5 secondary to anterolisthesis, ligamentum flavum thickening, and disc bulge with central protrusion.  2.  Additional severe left neural foraminal stenosis at L5-S1. Less pronounced areas of neural foraminal stenosis as above.    Other studies:  EKG:   Encounter Date: 2023-12-30   ECG 12 lead    Narrative    HEART RATE: 70  RR Interval: 856  P-R Interval: 161  P Duration: 108  P Front Axis: 62  QRSD Interval: 98  QT Interval: 437  QTcB: 472  QTcF: 460  QRS Axis: -14  T Wave Axis: 54  QTc Framingham: 459  QTc Hodges: 454  ECG Severity:   - ABNORMAL ECG -  ECG Impression:   Sinus rhythm      EMG/NCS BLE 11/07/2023:  Asymmetric absence of left H-reflexes suggestive of mild left S1 radiculopathy.  There is no electrophysiological evidence of generalized polyneuropathy affecting large diameter nerve  fibers, or lumbosacral radiculopathy at other levels on either side.    TTE 08/13/2023:  LVEF = 60%.  The left ventricular wall motion is normal.  There is trace/physiologic mitral regurgitation present  There is trace aortic regurgitation.    Assessment:  #Multifactorial gait disorder  Genora Arp is 73 y.o. right-handed female with vascular risk factors (hypertension, dyslipidemia, prediabetes) with history of L5-S1 discectomy in 2004, who was referred to neurology for evaluation of unsteady gait and recurrent falls.  - Pertinent symptoms include hyperactive neurogenic bladder, paresthesia in bilateral feet as well as fatigable weakness in both legs consistent with neurogenic claudication with no clear radiculopathic pain.  - Etiology of her gait impairment is likely due to severe lumbar spinal stenosis with compression of cauda equina at L4-L5 due to anterolisthesis, ligamentum flavum thickening and disc bulge with central protrusion.   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    - There is no evidence of polyneuropathy on EMG/NCS done in March 2025.  - There is no evidence of parkinsonism, no concern for NPH or cerebellar movement disorders.  - Patient would benefit from lumbar spine surgery as suggested by neurosurgery to relieve lumbar spinal stenosis as it can lead to progressive worsening of gait and risk for recurrent falls, which can be problematic specially with her osteoporosis.    #Paresthesia in bilateral feet  - Etiology is likely due to severe lumbar spinal stenosis with compression of cauda equina at L4-L5 level.  There is also contribution from cervical spinal stenosis at C4-C5 level.  - There is no evidence of polyneuropathy on EMG/NCS done in March 2025.    #History of syncope  - Patient has history of syncope in January 2025 with no prodromal symptoms, it was brief for a few seconds with no ictal sphincter dysfunction or convulsions,  no postictal confusion.  - Etiology is unclear, could be cardiogenic syncope due to underlying arrhythmia or heart block.   - Another differential is orthostatic hypotension although description does not seem to be consistent, she was found to have positive orthostatic vitals in May 2025.    - There could be contribution from medication side effect like tramadol.    - Less likely to be seizure or neurogenic syncope based on description.     Plan:  - Ambulatory referral to physical therapy to help with gait exercises  - Recommend follow-up with cardiology for evaluation of her prior syncope, would benefit from 30 days cardiac event monitor.  Patient will also benefit from using compression socks for management of orthostatic hypotension.  - Medications: Start gabapentin 300 mg BID to help with paresthesia, try to limit tramadol use given high risk of side effects.  - Follow-up with neurosurgery for management of lumbar spinal stenosis.    Spells-Seizures Precautions:  - Avoid sleep depravation and alcohol, since both might lower seizure threshold  - Avoid situations that might put you at risk of harm or injury, like going on roof, using ladder, swimming pool  - Avoid situations that might put others at risk of injury, like heavy machinery works,  firearm use, open flame  - Avoid driving for 6 months following a spell/seizure per [place] state law or 12 months per [place] state law per your driving license     Thank you for allowing me to participate in [facility] care. Please reach out to me or my office if any questions or concerns.    Sincerely,  Cathlean Co, MD  Neurologist, Neuroimmunologist, Neuroinfectious Diseases Specialist  [facility] [place] [facility]  [facility], [facility]   Work Phone: [phone]  Work Fax: [phone]    Dictation Software was used to dictate this note and, despite all efforts to proofread, some dictation related unintentional errors or  typos may occur. If you have any questions as the referring provider colleague, please do not hesitate to contact our office. If you are the patient reading this chart and reviewing the medical notes, please note that medical documentation is often written with abbreviations in medical terminology. ?Physician documentation is typically written by physicians for other healthcare providers' colleagues to review in order to efficiently and briefly summarize the clinical encounters, tests that were ordered and interpreted; and the diagnosis in the most efficient and effective way possible.     A total of 63 min were spent to for medical decision making and counseling. Medical Decision Making involved reviewing relevant Medical records (as available), relevant Lab results (if accessible), relevant Radiology results & images (if accessible), relevant Procedures and diagnostic tests (as applicable), independent visualization of relevant neurologic testing(as needed), all questions answered to my best up-to-date Knowledge and understanding, test discussed with the performing provider (if applicable), relevant Medical records ordered (if needed). Counseling involved counseled regarding diagnostic results, instructions for management, risk factor reductions, prognosis, patient education, impressions, importance of compliance with treatment and risks and benefits of treatment options, as indicated. Counseled regarding Tobacco/EtoH cessation and proper use and storage of opioid medications (if indicated and relevant), counseled regarding importance of compliance with treatment and risks and benefits of treatment options. Additional time was spent in additional diagnostic and management research, and relevant up-to-date info, and medical record review, as needed. Prescription Drug Monitoring Program Report: MD or Assigned Delegate Reviewed patient's report as needed prior to prescribing Schedule II, III and IV medications that  required review by law. Additional time was spent in care coordination including consultation with peers, staff, schedulers, nursing, and phone collaboration; as needed           [1]   Past Medical History:  Diagnosis Date    Cervical stenosis of spinal canal     Closed fracture of right proximal humerus     Degenerative joint disease     Hypertension      Hypertension      Neuropathy     Prediabetes     Recurrent falls    [2]   Past Surgical History:  Procedure Laterality Date    CHOLECYSTECTOMY      CTA CHEST W AND WO CONTRAST  08/12/2023    CTA CHEST W AND WO CONTRAST 08/12/2023 [facility] CT    HYSTERECTOMY     [3]   Current Outpatient Medications   Medication Sig Dispense Refill    ALPRAZolam (Xanax) 0.5 mg tablet Take 0.5 mg by mouth if needed at bedtime for anxiety. Indications: anxious      amLODIPine (Norvasc) 5 mg tablet Take 5 mg by mouth once daily.      Breo Ellipta 100-25 mcg/dose inhaler Inhale 1  puff in the morning. Rinse mouth after every use. 30 each 1    citalopram (CeleXA) 20 mg tablet Take 1 tablet (20 mg) by mouth once daily. 30 tablet 0    docusate sodium (Colace) 100 mg capsule Take 1 capsule (100 mg) by mouth twice daily. 60 capsule 0    gabapentin (Neurontin) 100 mg capsule Take 1 capsule (100 mg) by mouth at bedtime. 30 capsule 0    losartan (Cozaar) 50 mg tablet Take 50 mg by mouth once daily. Indications: high blood pressure      pravastatin (Pravachol) 10 mg tablet Take 1 tablet (10 mg) by mouth once daily. 90 tablet 3    traMADol (Ultram) 50 mg tablet Take 100 mg by mouth 1 (one) time each day. Indications: neuropathic pain      Ventolin HFA 90 mcg/actuation inhaler Inhale 1 puff every 4 (four) hours if needed.       No current facility-administered medications for this visit.

## 2024-01-01 ENCOUNTER — Ambulatory Visit: Admit: 2024-01-01 | Discharge: 2024-01-01 | Payer: MEDICARE | Primary: Internal Medicine

## 2024-01-01 DIAGNOSIS — I10 Essential (primary) hypertension: Secondary | ICD-10-CM

## 2024-01-01 MED ORDER — lisinopril 20 mg tablet
20 | ORAL_TABLET | Freq: Every day | ORAL | 3 refills | 90.00000 days | Status: DC
Start: 2024-01-01 — End: 2024-02-12

## 2024-01-01 NOTE — Patient Instructions (Signed)
 Your BP is good today in the office! Your machine is also accurate. No medication changes. Continue to monitor your BP daily and keep a log. Bring the log with you to your next visit with Dareen Ebbing, NP on 02/12/24. Call me if your BP trends consistently >120's/70's.

## 2024-01-01 NOTE — Progress Notes (Signed)
 [facility]  [address]  [address] [place] [address]  Dept: [phone]  Dept Fax: [phone]     Patient ID: Molly Jensen is a 73 y.o. female who presents for Hypertension (BP check and home BP machine validation.).    Subjective   [name] returns to the office today for a BP check and home BP machine validation. Since her last OV with Dr. Margery Sheets on 12/16/23, losartan was replaced by lisinopril 20 mg daily. Initially, PCP had decreased losartan to 50 mg daily, but the patient stopped losartan and resumed lisinopril 20 mg daily on her own 1 week ago. She reports that she feels much better on lisinopril.      Current Outpatient Medications   Medication Instructions    ALPRAZolam (XANAX) 0.5 mg, Nightly PRN    amLODIPine (NORVASC) 5 mg, Daily    citalopram (CELEXA) 20 mg, oral, Daily    docusate sodium (COLACE) 100 mg, oral, 2 times daily    gabapentin (NEURONTIN) 300 mg, oral, 2 times daily    lisinopril 20 mg, oral, Daily    pravastatin (PRAVACHOL) 10 mg, oral, Daily    traMADol (ULTRAM) 100 mg, Daily    Ventolin HFA 90 mcg/actuation inhaler 1 puff, Every 4 hours PRN     Allergies[1]    Objective   Visit Vitals  BP 128/86 Comment: repeat   Pulse 68   SpO2 99%   OB Status Hysterectomy     Physical Exam  Cardiovascular:      Rate and Rhythm: Normal rate and regular rhythm.   Pulmonary:      Effort: Pulmonary effort is normal.   Musculoskeletal:      Right lower leg: No edema.      Left lower leg: No edema.   Neurological:      Mental Status: She is alert.         Assessment/Plan   Conor was seen today for hypertension.  Primary hypertension   Assessment & Plan:  Normal manual cuff BP in the office today. Normal home BP on validated machine. Continue lisinopril  20 mg daily in place of losartan; patient had made this change on her own 1 week ago. Follow up with Dareen Ebbing, NP on 02/12/24. She will bring a list of home BP's to that visit. Goal BP consistently  <130/80.  Orders:  -     lisinopril 20 mg tablet; Take 1 tablet (20 mg) by mouth once daily.                               [1]   Allergies  Allergen Reactions    Latex Rash     Red bumps in mouth

## 2024-01-01 NOTE — Assessment & Plan Note (Signed)
 Normal manual cuff BP in the office today. Normal home BP on validated machine. Continue lisinopril  20 mg daily in place of losartan; patient had made this change on her own 1 week ago. Follow up with Dareen Ebbing, NP on 02/12/24. She will bring a list of home BP's to that visit. Goal BP consistently <130/80.

## 2024-01-03 NOTE — Telephone Encounter (Signed)
ERROE

## 2024-01-09 ENCOUNTER — Encounter: Payer: MEDICARE | Primary: Internal Medicine

## 2024-01-21 ENCOUNTER — Ambulatory Visit
Admit: 2024-01-21 | Discharge: 2024-01-21 | Payer: MEDICARE | Attending: Student in an Organized Health Care Education/Training Program | Primary: Internal Medicine

## 2024-01-21 DIAGNOSIS — R2681 Unsteadiness on feet: Secondary | ICD-10-CM

## 2024-01-21 NOTE — Progress Notes (Unsigned)
 Neurosurgery Office Visit Note    Patient:  Molly Jensen    DOB:  1951/04/10  MRN:  [id number]   Encounter Date: 01/21/2024    Reason for Visit: lumbar stenosis  PCP: Leavy Prow, NP     History of Present Illness  This is a 73 y.o. female presenting for evaluation of bilateral foot pain and lumbar stenosis.    01/21/24  Had a fall on Saturday- describes she was standing the doorway and felt her legs give out when she was trying to walk forward. Feels a disconnect between the signal from her brain telling her to go and her legs actually moving. Had actually felt some improvement of her imbalance prior to this and had not been using her cane. Saw Dr. Ginger Lai in consultation.    11/28/23  She has a history 20 years ago of L5-S1 discectomy. Prior to this, she was having severe radicular pain which required her to use a wheelchair. She notes improvement of pain and was ambulatory again. 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 She did develop low back pain afterwards and paresthesias of the feet requiring tramadol and gabapentin. She has been on this regimen since.     Currently, her main issue is a sense of imbalance and fear of falls. She has had several falls that led to an admission to the hospital in January. She has multiple orthopedic injuries. During these falls, she states that she cannot feel her legs. The fall preceding the admission rendered her unable to move on the floor for several hours, during which she worked to crawl on the floor to a phone. She ambulates with a cane since her admission. Has not had falls since she returned home, but she has also majorly scaled back her activity level. Generally feels imbalanced for a couple years.     She notes fine motor deficits of the hands - clumsiness with buttoning buttons. Trouble opening jars. She does note some neck pain that started a few years ago. This has been worsening and it does wake her up at night.    She also notes low back pain and burning  pain of the feet and ankles. These symptoms are the same as what started after surgery 20 years ago, but have progressively worsened and can be very uncomfortable. She feels it most significantly when she lays down. Gets better when she is upright and walking.     I personally and independently reviewed the imaging. My interpretation is noted in the Imaging Review section.    Medications  Current Outpatient Medications   Medication Instructions    ALPRAZolam (XANAX) 0.5 mg, Nightly PRN    amLODIPine (NORVASC) 5 mg, Daily    citalopram (CELEXA) 20 mg, oral, Daily    docusate sodium (COLACE) 100 mg, oral, 2 times daily    gabapentin (NEURONTIN) 300 mg, oral, 2 times daily    lisinopril 20 mg, oral, Daily    pravastatin (PRAVACHOL) 10 mg, oral, Daily    traMADol (ULTRAM) 100 mg, Daily    Ventolin HFA 90 mcg/actuation inhaler 1 puff, Every 4 hours PRN       Allergies  Allergies   Allergen Reactions    Latex Rash     Red bumps in mouth       Past Medical History  Past Medical History:   Diagnosis Date    Cervical stenosis of spinal canal     Closed fracture of right proximal humerus  Degenerative joint disease     Hypertension      Hypertension      Neuropathy     Prediabetes     Recurrent falls        Past Surgical History  Past Surgical History:   Procedure Laterality Date    CHOLECYSTECTOMY      CTA CHEST W AND WO CONTRAST  08/12/2023    CTA CHEST W AND WO CONTRAST 08/12/2023 [facility] CT    HYSTERECTOMY         Social History  Social History     Tobacco Use    Smoking status: Never     Passive exposure: Never    Smokeless tobacco: Never   Vaping Use    Vaping status: Never Used   Substance Use Topics    Alcohol use: Never    Drug use: Never       Family History  Family History   Problem Relation Name Age of Onset    No Known Problems Mother      No Known Problems Father         Review of Systems:  A 12-point review of systems was completed and negative unless otherwise reported in the HPI.    Physical Examination  Visit  Vitals  BP 133/89   Pulse 80   Ht 1.575 m   Wt 75.3 kg   BMI 30.36 kg/m   OB Status Hysterectomy   BSA 1.82 m       GENERAL: Well-developed, well-nourished. NAD.      ---  Focused Neurological Examination      -      MENTAL STATUS: AOX3.      -      CRANIAL NERVES: II-XII grossly intact.      -      MOTOR: Normal tone. Strength 5/5 in all muscle groups in bilateral upper extremities and bilateral lower extremities with the following exceptions: NO EXCEPTIONS.      -      REFLEXES: 2+ throughout and symmetric.      -      SENSORY: Numbness to pinprick bilateral feet extending halfway up the shin.      -      GAIT: Slow, cautious. Ambulates with cane. Romberg grossly positive. Tandem grossly abnormal.       -      INCISION: None      -      No tenderness to palpation along the back with the following exceptions: NO EXCEPTIONS.    Imaging Review  Scans were performed at [facility].   MRI: lumbar spine 09/18/23. Prior L L5-S1 hemilaminectomy defect. Focal severe canal stenosis at L4-5 on basis of concentric degenerative changes. L5-S1 severe disc space collapse and bilateral lateral recess stenosis.    MRI cervical spine: Focal severe central canal stenosis C4-5. No cord signal change.    Assessment/Plan:      In summary, Lewanna Petrak is a 73 y.o. presenting with a constellation of symptoms, most importantly gait instability and multiple falls.    She was seen in the emergency room and admitted to the hospital after several falls at the end of January.  Imaging performed at this time included MRI of the lumbar spine which demonstrated focal severe canal stenosis at L4-5 on the basis of concentric degenerative changes.  She has since returned home, and has not had further falls, but notes being very cautious about her gait and is generally fearful of  falling.  She has been ambulating with a cane regularly.  She also notes manual dexterity deficits and worsening weakness.  MRI cervical spine was obtained after the last visit,  and this does show focal severe central canal stenosis at C4-5.    Taken together, her exact constellation of symptoms are most likely on the basis of the combined effect of cervical and lumbar stenoses. Definitive surgical treatment would include C4-5 laminectomies, L L4-5 laminotomy for central and bilateral lateral recess decompression. I discussed with her that her symptoms do not follow any standard textbook course, as is sometimes the case in medicine.    At this time, she would like to pursue a watchful waiting management strategy, which I discussed with her. I think this is very reasonable, especially in light the significant improvement she had experienced prior to her fall three days ago. She at this time does not have fixed neurologic compromise, and as such I do not believe there is urgency to getting a surgery done. Certainly if the symptoms worsen, we can consider surgery further at that time.    I will see her back in 3 months for continued close follow-up.    Thank you for this consultation and for allowing me to participate in the care of your patient.    Disposition/Follow-up  Next clinic visit: 3 months    On the day of the encounter I spent 45 minutes on the total patient encounter, including time reviewing tests and/or medical records, taking a history, performing a medically appropriate examination and/or evaluation, counseling and educating the patient/family/caregiver, completing clinical documentation in the electronic or other health record, independently interpreting test results and communicating results to the patient/family/caregiver and care coordination including communication with referring provider(s).

## 2024-02-04 ENCOUNTER — Encounter: Payer: MEDICARE | Attending: Physician Assistant | Primary: Internal Medicine

## 2024-02-09 ENCOUNTER — Emergency Department: Admit: 2024-02-10 | Payer: MEDICARE | Primary: Internal Medicine

## 2024-02-09 DIAGNOSIS — W19XXXA Unspecified fall, initial encounter: Secondary | ICD-10-CM

## 2024-02-09 DIAGNOSIS — S0101XA Laceration without foreign body of scalp, initial encounter: Secondary | ICD-10-CM

## 2024-02-09 NOTE — ED Provider Notes (Signed)
 [facility] EMERGENCY DEPARTMENT MAIN CAMPUS  [address]  [place] [address]  02/10/24  1:45 AM     PATIENT  Molly Jensen  DOB: [birth year], MRN: [id number]      CHIEF COMPLAINT  Molly Jensen is a 73 y.o. female who presents to the ER for Fall      HISTORY OF PRESENT ILLNESS    History provided by:  Patient and relative (Daughter)  Language interpreter used: No      The patient is a 73 y.o. female  has a past medical history of Cervical stenosis of spinal canal, Closed fracture of right proximal humerus, Degenerative joint disease, Hypertension, Hypertension, Neuropathy, Prediabetes, and Recurrent falls. who presents to the ER for evaluation of Fall    73 year old female with past medical history of diabetes, hypertension, neuropathy, known severe stenosis at C4-5 and L4-5 spine with chronic leg imbalance and recurrent falls.  Since January the patient has been experiencing increased gait imbalance/instability and she has had several falls that have led to hospital admissions and multiple orthopedic injuries.  Patient reports she ambulates with a cane but her legs will just give out on her causing her to fall.  She is currently being worked up for this by Dr. Evelia at [facility] neurology.  It is thought that this imbalance is due in part to the lumbar and cervical stenosis and at this time they are pursuing a watchful waiting management strategy before moving forward with definitive surgical treatment.  Patient reports tonight she was walking when her legs just gave out.  She reports this is her typical presentation.  She denies any preceding symptoms -no dizziness/shortness of breath/chest pain/headache prior to the fall.  When she fell she hit the right side of her head on a coffee table and then fell to the ground hitting the back of her head on the floor.  She also landed on her right side and has had pain to the right thigh since. Pt denies LOC. Patient was able to get up and walk to her neighbor's house who then  called 911 and she was brought to the emergency room for evaluation.  Patient sustained a laceration to the right frontal scalp.  Her tetanus is up-to-date. Pt reports having a headache and neck pain. No chest pain, no SOB, no abd pain, no back pain, no nausea, no vomiting, no pain to the BUE.  Patient has chronic pain to the left leg from a previous fall but reports this is unchanged from her baseline.  She does have new pain to the right mid thigh although she is able to demonstrate full range of motion of the leg here.    ROS as per above HPI, otherwise is negative.    REVIEW OF SYSTEMS  Review of Systems  A 10 point review of systems was otherwise negative other than pertinent positives and negatives as noted in HPI, including Const, Head, Neck, HENT, CV, Pulm, GI, GU, Skin, MSK, Neuro, Psych.     HEALTH STATUS  Allergies[1]   Immunization History   Administered Date(s) Administered    Influenza, trivalent, adjuvanted 09/19/2023     E-cigarette/Vaping       Questions Responses    E-cigarette/Vaping Use Never User    Passive Exposure No    Counseling Given No          E-cigarette/Vaping Substances       Questions Responses    Nicotine No    THC No    CBD  No    Flavoring No          E-cigarette/Vaping Devices       Questions Responses    Disposable No    Pre-filled or Refillable Cartridge No    Refillable Tank No    Pre-filled Pod No             PATIENT HISTORY  MEDICAL PROBLEMS  Medical History[2]  SURGERIES  Surgical History[3]  FAMILY HISTORY  Family History[4]  SOCIAL HISTORY  Social History     Tobacco Use    Smoking status: Never     Passive exposure: Never    Smokeless tobacco: Never   Vaping Use    Vaping status: Never Used   Substance Use Topics    Alcohol use: Never    Drug use: Never       PHYSICAL EXAM  FIRST VITAL SIGNS  Temp: 36.4 C (97.6 F) Oral  Pulse: 97  BP: (!) 128/90  Resp: 16  SpO2: (!) 93 % Oxygen Therapy: None (Room air)  Vitals:    02/10/24 0144 02/10/24 0145 02/10/24 0146 02/10/24  0400   BP:  (!) 128/90  128/86   BP Location:    Left arm   Patient Position:    Lying   Pulse: 87   80   Resp:    20   Temp:    36.6 C (97.8 F)   TempSrc:    Oral   SpO2: 96%   96%   Weight:   75.3 kg    Height:   1.676 m      Temp: 36.6 C (97.8 F) Oral  Pulse: 80  BP: 128/86  Resp: 20  SpO2: 96 % Oxygen Therapy: None (Room air)    Glasgow Coma Scale Score: 15    Physical Exam  Vitals and nursing note reviewed.   Constitutional:       General: She is not in acute distress.     Appearance: She is well-developed.   HENT:      Head:      Comments: Small swelling and ecchymosis noted to the R frontal scalp with overlying 1cm superficial laceration. No deformity/abrasion/crepitus/depression       Right Ear: Tympanic membrane, ear canal and external ear normal.      Left Ear: Tympanic membrane, ear canal and external ear normal.      Ears:      Comments: No hemotympanum bilaterally.  Negative Battle sign bilaterally.     Mouth/Throat:      Mouth: Mucous membranes are moist.      Pharynx: Oropharynx is clear. No oropharyngeal exudate or posterior oropharyngeal erythema.   Eyes:      Extraocular Movements: Extraocular movements intact.      Conjunctiva/sclera: Conjunctivae normal.      Pupils: Pupils are equal, round, and reactive to light.   Neck:      Comments: Generalized tenderness to the neck.  Cervical collar is in place.  Cardiovascular:      Rate and Rhythm: Normal rate and regular rhythm.   Pulmonary:      Effort: Pulmonary effort is normal. No respiratory distress.      Breath sounds: Normal breath sounds. No stridor. No wheezing, rhonchi or rales.      Comments: No pain with palpation to the entire chest wall.  No depression, no crepitus, no step-off, no flail chest, no deformity, no abrasion, no laceration, no ecchymosis  Chest:  Chest wall: No tenderness.   Abdominal:      General: Abdomen is flat. There is no distension.      Palpations: Abdomen is soft.      Tenderness: There is no abdominal  tenderness. There is no right CVA tenderness, left CVA tenderness, guarding or rebound.   Musculoskeletal:      Cervical back: Normal range of motion and neck supple.      Comments: RLE: TTP to the R mid thigh laterally. No deformity, no abrasion, no laceration, no ecchymosis, no erythema, no swelling.  Patient has no pain with palpation to the hip or pelvis.  Patient able to demonstrate full range of motion of the right leg.  No shortening or malrotation.  2+ DP pulse.     No pain with palpation to the LLE or BUE.   Skin:     General: Skin is warm and dry.   Neurological:      General: No focal deficit present.      Mental Status: She is alert and oriented to person, place, and time.   Psychiatric:         Mood and Affect: Mood normal.         MEDICAL DECISION MAKING  Medical Decision Making  73 year old female with past medical history of diabetes, hypertension, neuropathy, known severe stenosis at C4-5 and L4-5 spine with chronic leg imbalance and recurrent falls.  Since January the patient has been experiencing increased gait imbalance/instability and she has had several falls that have led to hospital admissions and multiple orthopedic injuries.  Patient reports she ambulates with a cane but her legs will just give out on her causing her to fall.  She is currently being worked up for this by Dr. Evelia at [facility] neurology.  It is thought that this imbalance is due in part to the lumbar and cervical stenosis and at this time they are pursuing a watchful waiting management strategy before moving forward with definitive surgical treatment.  Patient reports tonight she was walking when her legs just gave out.  She reports this is her typical presentation.  She denies any preceding symptoms -no dizziness/shortness of breath/chest pain/headache prior to the fall.  When she fell she hit the right side of her head on a coffee table and then fell to the ground hitting the back of her head on the floor.  She also  landed on her right side and has had pain to the right thigh since. Pt denies LOC. Patient was able to get up and walk to her neighbor's house who then called 911 and she was brought to the emergency room for evaluation.  Patient sustained a laceration to the right frontal scalp.  Her tetanus is up-to-date. Pt reports having a headache and neck pain. No chest pain, no SOB, no abd pain, no back pain, no nausea, no vomiting, no pain to the BUE.  Patient has chronic pain to the left leg from a previous fall but reports this is unchanged from her baseline.  She does have new pain to the right mid thigh although she is able to demonstrate full range of motion of the leg here.    History and exam as noted above.  Patient arrived in a c-collar.  Initial vitals showed that the patient was afebrile.  Her O2 sat was registered at 93% but on recheck is between 96 to 97%.  Patient normotensive with no tachycardia.    Patient can clearly articulate that  she had a mechanical fall resulting in her falling onto her right side and hitting her head on a coffee table and then on the floor.  No preceding symptoms. She sustained a small superficial laceration to the right scalp which I will repair using the hair apposition technique and Dermabond (SEE PROCEDURE NOTE). Patient reports her tetanus is up-to-date.  Will proceed with head and C-spine CT to evaluate for any traumatic injuries.  Given that the patient had a mechanical fall with no preceding symptoms, labs/EKG do not appear indicated.  Patient's only other complaint of pain other than the head/neck/laceration is to the right thigh.  Will plan to obtain an x-ray of the right femur and pelvis to evaluate for any traumatic injuries although I have a low index of suspicion since she is moving the leg well.  If we are able to clear the leg, we will do an ambulation trial to see if she is safe for discharge. Will give a dose of IM morphine  for pain. Will closely monitor and then  re-evaluate.    5:30am  X-ray of the right femur and pelvis show no acute osseous abnormalities.  Head and C-spine CTs are negative for acute injury (negative for acute calvarial fracture or intracranial bleed, no cervical spine fracture).  The wound was repaired.  Patient had been given morphine  for pain as her daughter had reported that it was the only thing that seems to work for her when she has severe pain after falls.  Attempted to do an ambulation trial 2 hours after she received the medication.  She was able to get out of bed and bear weight on the right leg without assistance from the nurse however she appeared quite somnolent still and it was difficult to formally assess her stability due to the somnolence from the morphine .  Will continue to monitor her in the ER until the morphine  wears off so that we can formally assess her stability to see if she can be safely discharged home.    6am  Care transitioned to oncoming provider, Luke Meadows, PA-C.    Problems Addressed:  Contusion of right thigh, initial encounter: complicated acute illness or injury  Fall, initial encounter: complicated acute illness or injury  Head injury, initial encounter: complicated acute illness or injury  Scalp laceration, initial encounter: complicated acute illness or injury    Amount and/or Complexity of Data Reviewed  Radiology: ordered. Decision-making details documented in ED Course.    Risk  Prescription drug management.       Reviewed and confirmed history obtained in nursing triage notes regarding PMH and chart review performed of prior records as available to obtain additional history, past social history, and past family history as needed.    RESULTS  No orders to display          Labs Reviewed - No data to display       XR FEMUR RIGHT 2+ VIEWS   Final Result   No acute osseous abnormality.      Lorrene Mink, MD 02/10/2024 2:28 AM      XR PELVIS 1-2 VIEWS   Final Result   No acute osseous abnormality.      Lorrene Mink, MD 02/10/2024 2:39 AM      CT HEAD WO CONTRAST   Final Result   Negative for acute calvarial fracture or intracranial bleed.               Lorrene Mink, MD 02/09/2024 11:46 PM  CT CERVICAL SPINE WO CONTRAST   Final Result   No cervical spine fracture.      Lorrene Mink, MD 02/09/2024 11:44 PM            ED TREATMENTS  Medications   morphine  injection 4 mg (4 mg intramuscular Given 02/10/24 0302)      Lac. Repair    Performed by: Josette CHRISTELLA Mars, PA  Authorized by: Ronnald Sprung, MD    Consent:     Consent obtained:  Verbal    Consent given by:  Patient    Risks, benefits, and alternatives were discussed: yes      Risks discussed:  Infection, pain and poor cosmetic result  Universal protocol:     Procedure explained and questions answered to patient or proxy's satisfaction: yes      Relevant documents present and verified: yes      Imaging studies available: yes      Site/side marked: yes      Patient identity confirmed:  Verbally with patient  Anesthesia:     Anesthesia method:  None  Laceration details:     Location: R frontal scalp.    Length (cm):  1  Pre-procedure details:     Preparation:  Patient was prepped and draped in usual sterile fashion and imaging obtained to evaluate for foreign bodies  Exploration:     Limited defect created (wound extended): no      Imaging obtained comment:  CT    Imaging outcome: foreign body not noted      Wound exploration: wound explored through full range of motion and entire depth of wound visualized      Contaminated: no    Treatment:     Area cleansed with:  Saline    Amount of cleaning:  Standard    Irrigation solution:  Sterile saline    Debridement:  None    Undermining:  None    Scar revision: no    Skin repair:     Repair method: Hair apposition technique and Dermabond.  Approximation:     Approximation:  Close  Repair type:     Repair type:  Simple  Post-procedure details:     Dressing:  Open (no dressing)    Procedure completion:  Tolerated well,  no immediate complications      REEVALUATION/CONSULTS  Diagnoses as of 02/10/24 0554   Fall, initial encounter   Head injury, initial encounter   Scalp laceration, initial encounter   Contusion of right thigh, initial encounter        Final diagnoses:   [W19.XXXA] Fall, initial encounter   [S09.90XA] Head injury, initial encounter   [S01.01XA] Scalp laceration, initial encounter   [S70.11XA] Contusion of right thigh, initial encounter        PLAN  CONDITION:  Stable  DISPOSITION:  Care transitioned to oncoming provider, Luke Meadows, PA-C.     Medication List      ASK your doctor about these medications     ALPRAZolam  0.5 mg tablet; Commonly known as: Xanax    amLODIPine 5 mg tablet; Commonly known as: Norvasc   citalopram  20 mg tablet; Commonly known as: CeleXA ; Take 1 tablet (20   mg) by mouth once daily.   docusate sodium  100 mg capsule; Commonly known as: Colace; Take 1   capsule (100 mg) by mouth twice daily.   gabapentin  300 mg capsule; Commonly known as: Neurontin ; Take 1 capsule   (300 mg) by mouth twice daily.   lisinopril   20 mg tablet; Take 1 tablet (20 mg) by mouth once daily.   pravastatin  10 mg tablet; Commonly known as: Pravachol ; Take 1 tablet   (10 mg) by mouth once daily.   traMADol 50 mg tablet; Commonly known as: Ultram   Ventolin HFA 90 mcg/actuation inhaler; Generic drug: albuterol             [1]   Allergies  Allergen Reactions    Latex Rash     Red bumps in mouth   [2]   Past Medical History:  Diagnosis Date    Cervical stenosis of spinal canal     Closed fracture of right proximal humerus     Degenerative joint disease     Hypertension      Hypertension      Neuropathy     Prediabetes     Recurrent falls    [3]   Past Surgical History:  Procedure Laterality Date    CHOLECYSTECTOMY      CTA CHEST W AND WO CONTRAST  08/12/2023    CTA CHEST W AND WO CONTRAST 08/12/2023 [facility] CT    HYSTERECTOMY     [4]   Family History  Problem Relation Name Age of Onset    No Known Problems Mother      No Known  Problems Father          Josette CHRISTELLA Mars, [place]  02/10/24 [phone]

## 2024-02-09 NOTE — ED Triage Notes (Signed)
 BIBA from home after a fall.  Hx of aortic aneurysm.  Has neuropathy and recent falls.  Bleeding from forehead, now controlled.  Has cspine collar in place.

## 2024-02-10 ENCOUNTER — Emergency Department: Admit: 2024-02-10 | Payer: MEDICARE | Primary: Internal Medicine

## 2024-02-10 ENCOUNTER — Inpatient Hospital Stay: Admit: 2024-02-10 | Discharge: 2024-02-10 | Disposition: A | Discharge status: Home / Self Care | Payer: MEDICARE

## 2024-02-10 MED ORDER — morphine injection 4 mg
4 | Freq: Once | INTRAVENOUS | Status: AC
Start: 2024-02-10 — End: 2024-02-10
  Administered 2024-02-10: 07:00:00 4 mg via INTRAMUSCULAR

## 2024-02-10 MED FILL — MORPHINE 4 MG/ML INTRAVENOUS SOLUTION WRAPPER: 4 4 mg/mL | INTRAVENOUS | Qty: 1 | Fill #0

## 2024-02-10 NOTE — Other (Signed)
 Patient Education  Table of Contents   Laceration Care, Adult   Head Injury, Adult    To view videos and all your education online visit,  https://pe.elsevier.com/[email or web address]  or scan this QR code with your smartphone.  Access to this content will expire in one year.  Laceration Care, Adult  A laceration is a cut that may go through all layers of the skin. The cut may also go into the tissue that is right under the skin. Some cuts heal on their own. Other cuts need to be closed with stitches (sutures), staples, skin adhesive strips, or skin glue. Taking care of your cut lowers your risk of infection, helps your injury heal better, and may prevent scarring.  General tips   Keep your wound clean and dry.   Do not scratch or pick at your wound.   Wash your hands with soap and water  for at least 20 seconds before and after touching your wound or changing your bandage (dressing). If you cannot use soap and water , use hand sanitizer.   Do not usedisinfectants or antiseptics, such as rubbing alcohol, to clean your wound unless told by your doctor.   If you were given a bandage, change it at least once a day, or as told by your doctor. You should also change it if it gets wet or dirty.  How to take care of your cut  If your doctor used stitches or staples:   Keep the wound fully dry for the first 24 hours, or as told by your doctor. After that, you may take a shower or a bath. Do not soak the wound in water  until after the stitches or staples have been taken out.   Clean the wound once a day, or as told by your doctor. To do this:  ? Wash the wound with soap and water .  ? Rinse the wound with water  to remove all soap.  ? Pat the wound dry with a clean towel. Do not rub the wound.   After you clean the wound, put a thin layer of antibiotic ointment, another ointment, or a nonstick bandage on it as told by your doctor. This will help to:  ? Prevent infection.  ? Keep the bandage from sticking to the wound.   Have your stitches  or staples taken out as told by your doctor.  If your doctor used skin adhesive strips:   Do not get the skin adhesive strips wet. You can take a shower or a bath, but keep the wound dry.   If the wound gets wet, pat it dry with a clean towel. Do not rub the wound.   Skin adhesive strips fall off on their own. You can trim the strips as the wound heals. Do not take off any strips that are still stuck to the wound unless told by your doctor. The strips will fall off after a while.  If your doctor used skin glue:   You may take a shower or a bath, but try to keep the wound dry. Do not soak the wound in water .   After you take a shower or a bath, pat the wound dry with a clean towel. Do not rub the wound.   Do not do any activities that will make you sweat a lot until the skin glue has fallen off.   Do not apply liquid, cream, or ointment medicine to your wound while the skin glue is still on.   If a bandage is  placed over the wound, do not put tape right on top of the skin glue.   Do not pick at the glue. The skin glue usually stays on for 5?10 days. Then, it falls off the skin.  Follow these instructions at home:  Medicines   Take over-the-counter and prescription medicines only as told by your doctor.   If you were prescribed an antibiotic medicine, take or apply it as told by your doctor. Do not stop using it even if you start to feel better.  Managing pain and swelling   If told, put ice on the injured area. To do this:  ? Put ice in a plastic bag.  ? Place a towel between your skin and the bag.  ? Leave the ice on for 20 minutes, 2?3 times a day.  ? Take off the ice if your skin turns bright red. This is very important. If you cannot feel pain, heat, or cold, you have a greater risk of damage to the area.   Raise the injured area above the level of your heart while you are sitting or lying down.  General instructions     Avoid any activity that could make your wound reopen.   Check your wound every day for signs  of infection. Check for:  ? More redness, swelling, or pain.  ? Fluid or blood.  ? Warmth.  ? Pus or a bad smell.   Keep all follow-up visits.  Contact a doctor if:   You got a tetanus shot and you have any of these problems where the needle went in:  ? Swelling.  ? Very bad pain.  ? Redness.  ? Bleeding.   A wound that was closed breaks open.   You have a fever.   You have any of these signs of infection in your wound:  ? More redness, swelling, or pain.  ? Fluid or blood.  ? Warmth.  ? Pus or a bad smell.   You see something coming out of the wound, such as wood or glass.   Medicine does not make your pain go away.   You notice a change in the color of your skin near your wound.   You need to change the bandage often.   You have a new rash.   You lose feeling (have numbness) around the wound.  Get help right away if:   You have very bad swelling around the wound.   Your pain suddenly gets worse and is very bad.   You have painful lumps near the wound or on skin anywhere on your body.   You have a red streak going away from your wound.   The wound is on your hand or foot, and:  ? You cannot move a finger or toe.  ? Your fingers or toes look pale or bluish.  Summary   A laceration is a cut that may go through all layers of the skin. The cut may also go into the tissue right under the skin.   Some cuts heal on their own. Others need to be closed with stitches, staples, skin adhesive strips, or skin glue.   Follow your doctor's instructions for caring for your cut. Proper care of a cut lowers the risk of infection, helps the cut heal better, and may prevent scarring.  This information is not intended to replace advice given to you by your health care provider. Make sure you discuss any questions you have with your health care  provider.  Document Released: 2008-01-23 Document Updated: 2020-10-13 Document Reviewed: 2020-10-13  Elsevier Patient Education ? 2025 [employer]  Head Injury, Adult    There are many types of  head injuries. They can be as minor as a small bump. Some head injuries can be worse. Worse injuries include:   A strong hit to the head that shakes the brain back and forth, causing damage (concussion).   A bruise (contusion) of the brain. This means there is bleeding in the brain that can cause swelling.   A cracked skull (skull fracture).   Bleeding in the brain that gathers, gets thick (makes a clot), and forms a bump (hematoma).  Most problems from a head injury come in the first 24 hours. However, you may still have side effects up to 7?10 days after your injury. It is important to watch your condition for any changes. You may need to be watched in the emergency department or urgent care, or you may need to stay in the hospital.  What are the causes?  There are many possible causes of a head injury. A serious head injury may be caused by:   A car accident.   Bicycle or motorcycle accidents.   Sports injuries.   Falls.   Being hit by an object.  What are the signs or symptoms?  Symptoms of a head injury include a bruise, bump, or bleeding where the injury happened. Other physical symptoms may include:   Headache.   Feeling like you may vomit (nauseous) or vomiting.   Dizziness.   Blurred or double vision.   Being uncomfortable around bright lights or loud noises.   Feeling tired.   Trouble waking up.   Severe symptoms such as:  ? Feeling weak or numb on one side of the body.  ? Slurred speech.  ? Swallowing problems.  ? Fainting.  ? Shaking movements that you cannot control (seizures).  Mental or emotional symptoms may include:   Feeling grumpy or cranky.   Confusion and memory problems.   Having trouble paying attention or concentrating.   Changes in eating or sleeping habits.   Feeling worried or nervous (anxious).   Feeling sad (depressed).  How is this treated?  Treatment for this condition depends on how bad the injury is and the type of injury you have. The main goal is to prevent problems and to allow  the brain time to heal.  Mild head injury  If you have a mild head injury, you may be sent home, and treatment may include:   Being watched. A responsible adult should stay with you for 24 hours after your injury and check on you often.   Physical rest.   Brain rest.   Pain medicines.  Very bad head injury  If you have a very bad head injury, treatment may include:   Being watched closely. This includes staying in the hospital.   Medicines to:  ? Help with pain.  ? Prevent seizures.  ? Help with brain swelling.   Protecting your airway and using a machine that helps you breathe (ventilator).   Watching for and manage swelling inside the brain.   Brain surgery. This may be needed to:  ? Remove a collection of blood or blood clots.  ? Stop the bleeding.  ? Remove a part of the skull. This allows room for the brain to swell.  Follow these instructions at home:  Activity   Rest.   Avoid activities that are hard  or tiring.   Make sure you get enough sleep.   Let your brain rest. Do fewer activities that need a lot of thought or attention, such as:  ? Watching TV.  ? Playing memory games and doing puzzles.  ? Job-related work or homework.  ? Working on [employer], and texting.   Avoid activities that could cause another head injury until your doctor says it is okay. This includes playing sports.   Ask your doctor when it is safe for you to go back to your normal activities, such as work or school.   Ask your doctor when you can drive, ride a bicycle, or use machines. Do not do these activities if you are dizzy.  Lifestyle     Do not drink alcohol until your doctor says it is okay.   Do not use drugs.   If it is hard to remember things, write them down.   If you are easily distracted, try to do one thing at a time.   Talk with family members or close friends when making important decisions.   Tell your friends, family, a trusted co-worker, and work Production designer, theatre/television/film about your injury, symptoms, and limits  (restrictions). Have them watch for any problems that are new or getting worse.  General instructions   Take over-the-counter and prescription medicines only as told by your doctor.   Have a responsible adult stay with you for 24 hours after your head injury. This person should watch you for any changes in your symptoms and be ready to get help.   Keep all follow-up visits to catch any new problems early.  How is this prevented?  Having another head injury can be dangerous. Another injury can lead to brain damage, brain swelling, or death. You can avoid this by:   Working on Therapist, music. This can help you avoid falls.   Wearing a seat belt when you are in a moving vehicle.   Wearing a helmet when you:  ? Ride a bicycle.  ? Ski.  ? Do any other sport or activity that has a risk of injury.   Making your home safer by:  ? Getting rid of clutter from the floors and stairs. This includes things that can make you trip.  ? Using grab bars in bathrooms and handrails by stairs.  ? Placing non-slip mats on floors and in bathtubs.  ? Putting more light in dim areas.  Where to find more information   Brain Injury Association: biausa.org  Contact a doctor if:   These symptoms do not go away:  ? Headaches.  ? Dizziness.  ? Double vision or vision changes.  ? Trouble sleeping.  ? Changes in mood.   You have new symptoms.  Get help right away if:   You have sudden:  ? Headache that is very bad.  ? Vomiting that does not stop.  ? Changes in the size of one of your pupils. Pupils are the black centers of your eyes.  ? Changes in how you see (vision).  ? More confusion or more grumpy moods.   You have a seizure.   Your symptoms get worse.   You have a clear or bloody fluid coming from your nose or ears.  These symptoms may be an emergency. Get help right away. Call 911.   Do not wait to see if the symptoms will go away.   Do not drive yourself to the hospital.  This information is  not intended to replace advice given to  you by your health care provider. Make sure you discuss any questions you have with your health care provider.  Document Released: 2008-07-19 Document Updated: 2022-05-24 Document Reviewed: 2022-05-24  Elsevier Patient Education ? 2025 [employer].

## 2024-02-10 NOTE — Other (Signed)
 Patient Education  Table of Contents   Contusion   Fall Prevention in the Home, Adult    To view videos and all your education online visit,  https://pe.elsevier.com/[email or web address]  or scan this QR code with your smartphone.  Access to this content will expire in one year.  Contusion  A contusion is a deep bruise. Contusions are the result of a blunt injury to tissues and muscle fibers under the skin. The injury causes bleeding under the skin. The skin over the contusion may turn blue, purple, or yellow. Minor injuries will give you a painless contusion, but more severe injuries cause contusions that can stay painful and swollen for a few weeks.  Follow these instructions at home:  Pay attention to any changes in your symptoms. Let your health care provider know about them. Take these actions to relieve your pain.  Managing pain, stiffness, and swelling     Use resting, icing, applying pressure (compression), and raising (elevating) the injured area. This is often called the RICE method.  ? Rest the injured area. Return to your normal activities as told by your health care provider. Ask your health care provider what activities are safe for you.  ? If directed, put ice on the injured area. To do this:  ? Put ice in a plastic bag.  ? Place a towel between your skin and the bag.  ? Leave the ice on for 20 minutes, 2?3 times a day.  ? If your skin turns bright red, remove the ice right away to prevent skin damage. The risk of skin damage is higher if you cannot feel pain, heat, or cold.  ? If directed, apply light compression to the injured area using an elastic bandage. Make sure the bandage is not wrapped too tightly. Remove and reapply the bandage as directed by your health care provider.  ? If possible, elevate the injured area above the level of your heart while you are sitting or lying down.  General instructions   Take over-the-counter and prescription medicines only as told by your health care provider.   Keep all  follow-up visits. Your health care provider may want to see how your contusion is healing with treatment.  Contact a health care provider if:   Your symptoms do not improve after several days of treatment.   Your symptoms get worse.   You have difficulty moving the injured area.  Get help right away if:   You have severe pain.   You have numbness in a hand or foot.   Your hand or foot turns pale or cold.  This information is not intended to replace advice given to you by your health care provider. Make sure you discuss any questions you have with your health care provider.  Document Released: 2005-05-16 Document Updated: 2022-01-22 Document Reviewed: 2022-01-22  Elsevier Patient Education ? 2025 [employer].  Fall Prevention in the Home, Adult  Falls can cause injuries and can happen to people of all ages. There are many things you can do to make your home safer and to help prevent falls.  What actions can I take to prevent falls?  General information   Use good lighting in all rooms. Make sure to:   ? Replace any light bulbs that burn out.  ? Turn on the lights in dark areas and use night-lights.   Keep items that you use often in easy-to-reach places. Lower the shelves around your home if needed.   Move furniture so that  there are clear paths around it.   Do not use throw rugs or other things on the floor that can make you trip.   If any of your floors are uneven, fix them.   Add color or contrast paint or tape to clearly mark and help you see:  ? Grab bars or handrails.  ? First and last steps of staircases.  ? Where the edge of each step is.   If you use a ladder or stepladder:  ? Make sure that it is fully opened. Do not climb a closed ladder.  ? Make sure the sides of the ladder are locked in place.  ? Have someone hold the ladder while you use it.   Know where your pets are as you move through your home.  What can I do in the bathroom?       Keep the floor dry. Clean up any water  on the floor right  away.   Remove soap buildup in the bathtub or shower. Buildup makes bathtubs and showers slippery.   Use non-skid mats or decals on the floor of the bathtub or shower.   Attach bath mats securely with double-sided, non-slip rug tape.   If you need to sit down in the shower, use a non-slip stool.   Install grab bars by the toilet and in the bathtub and shower. Do not use towel bars as grab bars.  What can I do in the bedroom?   Make sure that you have a light by your bed that is easy to reach.   Do not use any sheets or blankets on your bed that hang to the floor.   Have a firm chair or bench with side arms that you can use for support when you get dressed.  What can I do in the kitchen?   Clean up any spills right away.   If you need to reach something above you, use a step stool with a grab bar.   Keep electrical cords out of the way.   Do not use floor polish or wax that makes floors slippery.  What can I do with my stairs?   Do not leave anything on the stairs.   Make sure that you have a light switch at the top and the bottom of the stairs.   Make sure that there are handrails on both sides of the stairs. Fix handrails that are broken or loose.   Install non-slip stair treads on all your stairs if they do not have carpet.   Avoid having throw rugs at the top or bottom of the stairs.   Choose a carpet that does not hide the edge of the steps on the stairs. Make sure that the carpet is firmly attached to the stairs. Fix carpet that is loose or worn.  What can I do on the outside of my home?   Use bright outdoor lighting.   Fix the edges of walkways and driveways and fix any cracks. Clear paths of anything that can make you trip, such as tools or rocks.   Add color or contrast paint or tape to clearly mark and help you see anything that might make you trip as you walk through a door, such as a raised step or threshold.   Trim any bushes or trees on paths to your home.   Check to see if handrails are loose or broken  and that both sides of all steps have handrails. Install guardrails along the edges  of any raised decks and porches.   Have leaves, snow, or ice cleared regularly. Use sand, salt, or ice melter on paths if you live where there is ice and snow during the winter.   Clean up any spills in your garage right away. This includes grease or oil spills.  What other actions can I take?   Review your medicines with your doctor. Some medicines can cause dizziness or changes in blood pressure, which increase your risk of falling.   Wear shoes that:  ? Have a low heel. Do not wear high heels.  ? Have rubber bottoms and are closed at the toe.  ? Feel good on your feet and fit well.   Use tools that help you move around if needed. These include:  ? Canes.  ? Walkers.  ? Scooters.  ? Crutches.   Ask your doctor what else you can do to help prevent falls. This may include seeing a physical therapist to learn to do exercises to move better and get stronger.  Where to find more information   Centers for Disease Control and Prevention, STEADI: TonerPromos.no   [employer] on Aging: BaseRingTones.pl   National Institute on Aging: BaseRingTones.pl  Contact a doctor if:   You are afraid of falling at home.   You feel weak, drowsy, or dizzy at home.   You fall at home.  Get help right away if you:   Lose consciousness or have trouble moving after a fall.   Have a fall that causes a head injury.  These symptoms may be an emergency. Get help right away. Call 911.   Do not wait to see if the symptoms will go away.   Do not drive yourself to the hospital.  This information is not intended to replace advice given to you by your health care provider. Make sure you discuss any questions you have with your health care provider.  Document Released: 2009-06-02 Document Updated: 2022-04-09 Document Reviewed: 2022-04-09  Elsevier Patient Education ? 2025 [employer].

## 2024-02-10 NOTE — ED Progress Note (Cosign Needed)
 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  Since January the patient has been experiencing increased gait imbalance/instability and she has had several falls that have led to hospital admissions and multiple orthopedic injuries.  Patient reports she ambulates with a cane but her legs will just give out on her causing her to fall.  She is currently being worked up for this by Dr. Evelia at [facility] neurology.   Patient presented with the daughter after another fall, which was the same as previous falls.  She hit her head and sustained small laceration to the right side of her scalp which was repaired with hair opposition and dermabond.      XR pelvis and femur without acute Fx disolocation.    CT head without acute ICH or other acute findings and CT cervical spine without actue fx dislocation.    Patient had pain s/p fall and per her daughter the only thing that worked for her was morphine  and thus she was given a dose of morphine  4 mg IV and has been groggy and sleeping since.  Currently awaiting patient to awaken and then get her up and ambulate with plan to d/c home if at her baseline.           Last Vital Signs  Temp: 36.6 C (97.8 F) Oral  Pulse: 77  BP: 117/80  Resp: 20  SpO2: 97 % Oxygen Therapy: None (Room air)    RESULTS  Labs Reviewed - No data to display   XR FEMUR RIGHT 2+ VIEWS   Final Result   No acute osseous abnormality.      Lorrene Mink, MD 02/10/2024 2:28 AM      XR PELVIS 1-2 VIEWS   Final Result   No acute osseous abnormality.      Lorrene Mink, MD 02/10/2024 2:39 AM      CT HEAD WO CONTRAST   Final Result   Negative for acute  calvarial fracture or intracranial bleed.               Lorrene Mink, MD 02/09/2024 11:46 PM      CT CERVICAL SPINE WO CONTRAST   Final Result   No cervical spine fracture.      Lorrene Mink, MD 02/09/2024 11:44 PM          PROCEDURE(S)  Procedures    RESTRAINT(S)       ED COURSE/MEDICAL DECISION MAKING  ED Medication Administration from 02/09/2024 2155 to 02/10/2024 1347         Date/Time Order Dose Route Action Action by     02/10/2024 0302 EDT morphine  injection 4 mg 4 mg intramuscular Given Shana CROME          ED Course as of 02/10/24 1347   Mon Feb 10, 2024   1059 Patient has been evaluated by PT who recommends rehab.  Patient refusing to go to rehab.  COC involved and will reach out to patient's daughter to discuss.     1342 COC has spoken with the patient and her daughter.  Patient was okay with going to an  acute rehab facility, but does not want to go to skilled nursing at a nursing home as she has had previously bad experiences.  COC was not able to get her qualified for acute rehab at this time.  She was able to ambulate with PT utilizing pain and walker.  Per the daughter this is her baseline.  She continues to follow outpatient with neurology for outpatient workup to her unsteady gait.  Given that patient has no interest in skilled nursing, would like to be discharged home.  She does live alone but her daughter is willing to have her stay at their home.  COC will schedule for home therapy and VNA services.    Will plan to discharge patient home at this time.  Daughter is able to drive her home.         Diagnoses as of 02/10/24 1347   Fall, initial encounter   Head injury, initial encounter   Scalp laceration, initial encounter   Contusion of right thigh, initial encounter     Medical Decision Making  Problems Addressed:  Contusion of right thigh, initial encounter: complicated acute illness or injury  Fall, initial encounter: complicated acute illness or injury  Head injury, initial encounter:  complicated acute illness or injury  Scalp laceration, initial encounter: complicated acute illness or injury    Amount and/or Complexity of Data Reviewed  Radiology: ordered.    Risk  Prescription drug management.          [place], [place]  02/10/24 [phone]

## 2024-02-10 NOTE — Progress Notes (Signed)
 Case Management Progress Note:   Pt denied Acute Rehab, she declines any kind of SNF, she has been getting work up with Neurology  Outpt, pt agreeable to referral to [facility] as she has had them in past.  D/C home with daughter   [facility] PT OT   Nena Reus RN  02/10/2024

## 2024-02-10 NOTE — Consults (Signed)
 Physical Therapy Evaluation    Patient Name: Molly Jensen  MRN: [id number]  DOB: 1951/07/10  Evaluation Date: 02/10/2024    Subjective   HPI/Hospital Course:   Breckin Zafar is a 73 y.o. female who was admitted on 6/22 for falls, headstrike, R hip pain.  Imaging (-) for acute pathology.   Known C/S and L/S spinal stenosis with OP follow by neurosurgery for possible surgery.  PMHx of Cervical stenosis of spinal canal, Closed fracture of right proximal humerus, Degenerative joint disease, Hypertension, Hypertension, Neuropathy, Prediabetes, and Recurrent falls.     Problem List[1]    Medical History[2]    Surgical History[3]    Objective     General Information  General Info  PT Received On: 02/10/24  Following Therapy Session:: Call bell within reach, Patient in Bed, Nursing Staff Aware of Patient Location  Plan of Care Reviewed With:: Patient, Case Manager, RN/Charge Nurse  Recommended mobility with nursing staff: 1A amb as tolerated  Eval Information  Type of Evaluation: Initial Evaluation    Precautions  Other Precautions: Fall Risk    Home Living  Was Patient Admitted from STR?: No  Lives With: Alone  Type of Home: House  Home Layout: Two Level, Performs ADL's on One Level, Able to Live on One Level with Bedroom/Bathroom  Number of Stairs to Enter Home: 1  Number of Stairs Within Home: 1 FOS  Bathroom Shower/Tub: Medical sales representative: Standard  Home Equipment: Rolling Davy, Single [employer]  ADL/IADL Equipment: Paediatric nurse, Engineer, materials in Air traffic controller    Prior Level of Function  Information Provided By: Patient  Dominance: Right  Independent at Baseline: All [employer], All Community Mobility, All ADL's/IADL's  History of Recent Falls: Yes (4 since May 31).  Pt reports her legs give out.    Pain  Pain Assessment: 0-10  Pain Score:  (8/10)  Pain Type: Acute pain  Pain Location: Head  Pain Orientation: Right  Pain Management Interventions: premedicated for activity  Multiple Pain Sites: Two  Pain  2  Pain Score 2: 8  Pain Type 2: Acute pain  Pain Location 2: Hip  Pain Orientation 2: Right  Pain Interventions 2: care clustered, position adjusted, premedicated for activity    Vital Signs  Vital Signs  BP: 117/80  BP Location: Right arm  BP Method: Automatic  Oxygen Therapy  SpO2: 97 %  Oxygen Therapy: None (Room air)    Cognition  Following Commands: Follows multistep commands  Safety Judgment: Decreased Safety awareness  Awareness of Errors/Deficits: Intact  Attention Span: Intact  Memory: Intact  Problem Solving: Impaired  Communication: Not Impaired  Behavioral/Affect Comments: calm, cooperative    Integumentary  Integumentary: small laceration to R side of head, not bleeding  LDA Integrity: Intact Pre and Post Therapy (PIV, BP cuff, pulse ox)    ROM/Strength  PROM Right Lower Extremity  Overall RLE PROM: WFL  PROM Left Lower Extremity  Overall LLE PROM: [facility]  Strength Right Lower Extremity  RLE Overall Strength: Other (Comment) (grossly 3+/5-4/5)  Strength Left Lower Extremity  LLE Overall Strength: Other (Comment) (grossly 3+/5-4/5)    Mobility Assessment  Bed Position: HOB elevated  Supine to Sit: Supervision  Sit to Supine: Supervision    Sit to Stand: Minimal Assist  Stand to Sit: Supervision  Assistive Device: Single [employer] (Feet): 45  Assistance Level: Minimal Assist  Assistive Device: Single Point Cane  Gait Characteristics: very slow, antalgic on RLE with  slight buckling that improved with distance              [place]  [employer] Balance Scale: Yes  KU Sitting Balance Scale: 4+ - Independently moves and returns to center of gravity 1-2 inches in multiple planes.  KU Standing Balance Scale: 1+ - Supports self with upper extremities but requires therapist assistance. Patient performs greater than 50% of effort (minimal assist).    AMPAC - (Basic Mobility Inpatient Short Form) How much help from another person do you currently need?  Turning from your back to your side while in a flat bed  without using bedrails?: 4 - None  Moving from lying on your back to sitting on the side of a flat bed without using bedrails?: 3 - A little  Moving to and from a bed to a chair (including a wheelchair)?: 3 - A little  Standing up from a chair using your arms (e.g., wheelchair, or bedside chair)?: 3 - A little  To walk in hospital room?: 3 - A little  Climbing 3-5 steps with a railing?: 3 - A little  Raw Score: 19         Education  Education Provided: Role of PT/Plan of Care, Discharge Planning, Adaptive Equipment/Assistive Device, Development worker, community, Safety Awareness/Fall Risk, Activities Guidelines  Education Provided to: Patient  Teaching Method: Demonstration, Discussion  Barriers to learning: Desires/motivation  Learning Evaluation: Verbalizes understanding, Demonstrates/applies knowledge, Needs reinforcement/further teaching    Assessment   Pt agreeable to participate.  Completed limited mobility with pain.  Feeling initially off, improved with distance.  Very slow gait, antalgic RLE, mild buckling initially that improved with distance.  Pt requested to use cane.  Recommend rehab at this time based on impairments and she lives alone, pt refusing.    Patient presents with Impaired gait, Functional Mobility deficit, Impaired activity tolerance, Balance deficit, Pain, Strength deficit, Impaired safety awareness.  she presents to PT functioning below her baseline independence with the above noted impairments. she  would benefit from skilled PT services to maximize safety and independence in mobilization.       Prognosis: Fair       Goals   Short Term Goals  Date Established/Amended: 02/10/24  Goal timeframe: 2 weeks  Bed mobility goal: Supine<>sit independent  Transfers goal: Sit<>stand independent  Ambulation goal: Amb 150' mod I with LRAD  Stairs goal: Pt will negotiate FOS with LRAD mod I         Plan   Plan: Continued Skilled Inpatient PT Services   Treatment Interventions: Therapeutic exercise, Assistive  device training, Functional mobility training, Gait training, Stair training, Neuromuscular Re-education/balance, Patient/family education, Energy conservation  PT Frequency and Duration: 3-5 x/week until Discharge  Treatment:      Recommendations  Anticipate Discharge to: Rehab (pt refusing)  Recommended mobility with nursing staff: 1A amb as tolerated  Devaughn Sick, PT  [place] lic # [id number]           [8]   Patient Active Problem List  Diagnosis    Primary osteoarthritis of right knee    Primary osteoarthritis of left knee    Influenza B    Syncope and collapse    Syncope, unspecified syncope type    Multiple falls    Fall, initial encounter    Hypertension     Degenerative joint disease    Metatarsalgia    Nuclear sclerotic cataract of both eyes    Numbness of foot    Posterior vitreous detachment of both  eyes    Refractive error    Spinal stenosis of lumbar region without neurogenic claudication    Vitreous hemorrhage of right eye (CMS-HCC)    Primary hypertension     Influenza due to influenza virus, type B    Recurrent falls    Osteoarthritis of left knee    Osteoarthritis of right knee    Syncope    Cervical stenosis of spinal canal    Prediabetes    Neuropathy    Closed fracture of right proximal humerus   [2]   Past Medical History:  Diagnosis Date    Cervical stenosis of spinal canal     Closed fracture of right proximal humerus     Degenerative joint disease     Hypertension      Hypertension      Neuropathy     Prediabetes     Recurrent falls    [3]   Past Surgical History:  Procedure Laterality Date    CHOLECYSTECTOMY      CTA CHEST W AND WO CONTRAST  08/12/2023    CTA CHEST W AND WO CONTRAST 08/12/2023 [facility] CT    HYSTERECTOMY

## 2024-02-11 ENCOUNTER — Encounter: Primary: Internal Medicine

## 2024-02-11 ENCOUNTER — Encounter: Admit: 2024-02-11 | Discharge: 2024-02-11 | Payer: MEDICARE | Primary: Internal Medicine

## 2024-02-11 NOTE — Home Health (Signed)
 Patient age/gender/living situation/Primary Dx for homecare/Reason for Admission: Pt is a 73 y/o female admitted to [facility] on 02/09/24 with fall at home resulting in R scalp laceration after hitting head on coffee table. Patient reports B LE imbalance since January 2025. Discharged to home 02/10/24 with referral to [facility]    Current Problems requiring skilled service to be addressed by above interventions (clinical, mental, adl and functional mobility): high fall risk, decreased B LE strength, impaired balance, impaired functional mobility    PMH: Cervical stenosis of spinal canal at C4-C5, lumbar stenosis of spinal canal at L4-L5, Closed fracture of right proximal humerus, Degenerative joint disease, Hypertension, Hypertension, Neuropathy, Prediabetes, and Recurrent falls       PLOF  bed mobility: Ind without device  sup<>sit: Ind without device  sit<>stand: Ind without device  household gait: Ind without device (prior to Jan 2025)  community gait: Ind without device distances > 1020ft  stairs: Ind without device x 3 outdoors and 15 indoors  B LE strength: 5/5  Driving: Yes  Active in Community: Yes    CLOF  bed mobility: Ind without device  sup<>sit: Ind without device  sit<>stand: SBA  household gait: 69ft x 2 with RW  community gait: Not tested  stairs: 3 outdoor stairs, 15 indoor stairs  B LE strength: 4- to 4/5  Driving: No  Active in Community: No    Education Provided: I/E patient on role of PT, benefits of exercise/functional Insurance account manager / Assistance at home: Patient lives alone, has 3 stairs toe nter, bedroom and 1/2 bath on 1st floor, full bathroom on 2nd floor    Homebound Status: Pt is homebound d/t taxing effort to leave home, impaired activity tolerance, strength, and endurance.    PT Evaluation completed. Pt will benefit from skilled PT services to address the above impairments in order to maximize independence and safety for safe return to prior level of  functioning    Psychosocial and cognitive issues identified which may impact plan of care: anxiety    Any additional services requested based on risk assessment? No    Patient Identified care representative: Daughter    Reviewed DC plan at [facility] with:  Patient Yes  Caregiver/representative No       Call to MD(name) Ranell Legions, MD to confirm Medications & Orders Obtained: spoke with office staff

## 2024-02-11 NOTE — Telephone Encounter (Signed)
 I am calling in regard to a referral for home health services we received from [facility].    I would like to confirm your address: [address] [place] [address]   Address confirmed: Yes    I would like to confirm your best contact number: [phone]   Contact number confirmed: Yes    Do you have any other home care services or agencies coming into your home?    Other agency present: No    Do you have another nurse or physical therapist visiting you at home?    Other staff present: No    Do you have an appointment scheduled with your doctor in the next 7 days?  MD follow-up appointment: No - Comment: NA    Can I confirm that RANELL LEGIONS, MD is your primary care physician?   PCP confirmed: Yes    If there are pets in the home, please ensure that your pet is in another room during the clinicians' home visits.     If there are weapons in the home, they need to be locked up and secured. If not, the clinician will need to leave the home.     Is anyone in the home experiencing flu-like symptoms or respiratory symptoms?    Flu-like symptoms: No    Did you have any new or changed medications that need to be picked up at the pharmacy?    New medications: N/A    Were you able to pick them up?    All medications in the home: N/A    Do you have the oxygen in your home?    Have oxygen at home: No    Do you have any supplies you need until our staff comes to your home?    Need supplies immediately: N/A    Could you please make your insurance card available for visit?    Could you please have all your medications available for your visit, both your prescriptions and any over-the-counter meds you are taking?    The services ordered for you are: Physical Therapy and Occupational Therapy    The team caring for you is [facility]. I am the scheduler for you and my name is or the scheduler for you is Rex Magee. My/their return number is [phone]. Please feel free to call me back if there is anything I can help with.     Comments:

## 2024-02-11 NOTE — Case Communication (Signed)
 PT SOC and evaluation completed. Patient in agreement with 2x/week x 9 weeks.

## 2024-02-11 NOTE — Telephone Encounter (Signed)
 Risk for hospitalization/emergent care and actions to mitigate risks noted in [facility]? yes - 82% high risk    Has the patient been in the hospital or used the ER in the last 30 days? yes - 6/22-6/23 [facility] ED for fall    Which care plan pathway should this case have (may select multiple)? Other    Needed follow-up with physician regarding orders or communication:  Does the patient have a primary care physician directing the patient's care at the time of admission visit? Yes  Is the PCP listed in the record correct? Yes  When is the next PCP appointment? Dr Volanda;  Cardiology 6/25; neuro in september  Are we awaiting orders from the physician? If so, for what are the orders? yes -      What skilled disciplines were ordered on referral? Physical Therapy and Occupational Therapy    Visit frequency for [facility] discipline: 2x/week x 9 weeks due to numerous falls and safety issues    Do we need additional disciplines based on assessment? ?MSW for community services    Order obtained to add/discontinue disciplines based on referral? No    If the patient is identified as high risk by the Hospitalization and Emergent Risk Screening Tool, consider what best practices should be implemented such as obtaining proactive orders, parameters, frontloading, phone calls.  If the patient utilizes the emergency room for treatment, obtain proactive orders to avoid.    Is there a high-risk diagnosis? No  If CHF, where is it listed? N/a    Identification of patient's immediate needs/adverse findings: lack of caregiver/support system and other: home safety--DME support    Ideas to mitigate: high anxiety; numerous falls/safety  If present, discuss with the CM to determine if the patient can be safely and adequately cared for in the home setting.    Supportive evidence of homebound status present? Yes  If yes, what reason? High falls and needs assist to go out    Preliminary primary focus of care:  Why was the patient referred to home health? falls  Do  the POC orders support the reason for the patient's referral to home care and any additional needs the patient may have? Yes    Are medication issues present? no    Is there an updated med list in the home? yes -      Does the patient have all medications listed on the facility discharge medication list and physician's orders? Yes    Noted concerns of caregiver or ability to follow plan of care:  Was the caregiver present during the admission visit and was the caregiver actively involved with the plan of care development? No   Is the caregiver able and willing to participate in the patient's care? No  Did the caregiver provide a return demonstration of any procedures they are expected to perform, and if not, have we scheduled additional nursing visits to continue teaching the procedure until caregiver can demonstrate the skill adequately and independently? No. Explain: dtr lives locally but does not provide frequent support--but checks on her weekly    Did you:  Leave the Admit booklet? Yes  Educate on how to contact the agency (phone number)? Yes  Review calling us  first? Yes  Review weekend visit acceptance? Yes  Provide UTI kit and education? No n /a

## 2024-02-11 NOTE — Telephone Encounter (Signed)
 Hi Molly Jensen,      Patient called in would like to ask if our Spine doctors Do Spinal Stenosis Problem.    Thank you!

## 2024-02-12 ENCOUNTER — Ambulatory Visit: Admit: 2024-02-12 | Discharge: 2024-02-12 | Payer: MEDICARE | Attending: Family | Primary: Internal Medicine

## 2024-02-12 ENCOUNTER — Encounter: Admit: 2024-02-12 | Discharge: 2024-02-12 | Payer: MEDICARE | Primary: Internal Medicine

## 2024-02-12 DIAGNOSIS — I77810 Thoracic aortic ectasia: Secondary | ICD-10-CM

## 2024-02-12 MED ORDER — lisinopril 10 mg tablet
10 | ORAL_TABLET | Freq: Every day | ORAL | 3 refills | 90.00000 days | Status: AC
Start: 2024-02-12 — End: 2025-02-11
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
## 2024-02-14 ENCOUNTER — Encounter: Admit: 2024-02-14 | Discharge: 2024-02-14 | Payer: MEDICARE | Primary: Internal Medicine

## 2024-02-14 NOTE — Case Communication (Signed)
 Patient reports she is having diarrhea and declines PT visit today

## 2024-02-17 ENCOUNTER — Encounter: Admit: 2024-02-17 | Discharge: 2024-02-17 | Payer: MEDICARE | Primary: Internal Medicine

## 2024-02-17 ENCOUNTER — Encounter: Primary: Internal Medicine

## 2024-02-18 ENCOUNTER — Encounter: Primary: Internal Medicine

## 2024-02-18 ENCOUNTER — Ambulatory Visit: Payer: MEDICARE | Primary: Internal Medicine

## 2024-02-19 ENCOUNTER — Encounter: Admit: 2024-02-19 | Discharge: 2024-02-19 | Payer: MEDICARE | Primary: Internal Medicine

## 2024-02-20 ENCOUNTER — Encounter: Admit: 2024-02-20 | Discharge: 2024-02-20 | Payer: MEDICARE | Primary: Internal Medicine

## 2024-02-20 NOTE — Home Health (Signed)
 OCCUPATIONAL INITIAL EVALUATION 02-20-24       Patient is 73 year old female receiving Homecare occupational therapy services s/p  admission to [facility] on 02/09/24 with fall at home resulting in R scalp laceration after hitting head on coffee table. Patient reports B LE imbalance since January 2025. Discharged to home 02/10/24 with referral to [facility]. 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 Patient currently weaing halter monitor and has CT scan 7/8. OT therapeutic interventions addressed today include OT initial evaluataion, pt education, safety with ADLs/IADLs, functional mobility and transfer training, energy conservation/pacing. Patient has all necessary DME to complete ADLs safely and OT teaching/instruction completed. Patient is agreement with OT evaluation only and OT dischaarge today. Spoke to [place] in Dr Volanda office and informed of OT evaluation only    AE/DME: suction cup GB, hand-held shower head, tubseat , hospital bed    COGNITION: Alert and oriented x 4.     PAIN: Patient reports occasional headaches 0-5/10    VITALS: Within parameters     UE ROM/STRENGTH: Right hand dominant. LUE strength and AROM WFL . Right shoulder flexion 120 degrees. Able to complete bilateral ER to achieve hairwashing    GI/GU: No Urinary incontinence. No reported bowel issues. Patient using RUE for dominant functional use with no issues.     TRANSFERS/MOBILITY: Tub transfers; Independent with GB and tubseat. Toilet transfers:Independent. Bed mobility: Independent. Patient is ambulating within the home using no device,   ADLs: Grooming: Independent. Sponge bathing: Independent Dressing; Independent . Showering: Independent with GB and tubseat.     IADLs: Independent for meal preparation     ACTIVITY TOLERANCE: Good for mobility, ADLs

## 2024-02-24 ENCOUNTER — Encounter: Primary: Internal Medicine

## 2024-02-25 ENCOUNTER — Encounter: Admit: 2024-02-25 | Discharge: 2024-02-25 | Payer: MEDICARE | Primary: Internal Medicine

## 2024-02-25 ENCOUNTER — Encounter: Primary: Internal Medicine

## 2024-02-25 ENCOUNTER — Inpatient Hospital Stay: Admit: 2024-02-25 | Payer: MEDICARE | Primary: Internal Medicine

## 2024-02-25 DIAGNOSIS — J9811 Atelectasis: Principal | ICD-10-CM

## 2024-02-25 NOTE — Home Health (Signed)
 73 y/o female admitted to [facility] on 02/09/24 with fall at home resulting in R scalp laceration after hitting head on coffee table.  Discharged to home 02/10/24 with referral to [facility]. Patient has 8 falls over the course of 6 months. Patient seen today for follow up PT visit. Denies any falls since last visit. Reports L sided leg and ankle pain from previous falls. Noted L ankle swelling. Instructed on use of elevation and compression to reduce pain and swelling. Good understanding by patient. Patient appears to be lacking L ankle ROM in all directions. Provided patient with ROM and strengthening therex of L ankle. Patient performed L PF/Df, IV,EV x15e in supine position. Patient then performed seated toe flex/ext. Demonstrated difficulty with muscle activation. Instructed to complete as HEP.     Patient will be meeting with Dr. Alpheus for a second opinion of her spinal stenosis and plan for surgery. Patient has concerns about surgery as she has had back surgery in the past. She is unsure if she would like to pursue this option. Discussed meeting with Dr. Alpheus before making decision.     Patient is homebound due to being a high fall risk and requires SBA to leave the home.

## 2024-02-25 NOTE — Case Communication (Signed)
 CARE CONFERENCE 02-25-24    Present: Charleen Servant OT, Shawn Murphree PT, Luke Doss [facility] Rehab manager  Rehab problem list: Hx of falls/recent fall, decreased overall strength, impaired balance  Progress : OT evaluation only as pt has all recommended DME for safety with ADLs and pt at baseline for ADLs. Pt has been evaulated for PT and verbal order received for continuation of PT services but pateint has either declined or not answer ed voicemail/texts for subsequent PT visits (3 missed visits). PT to educate patient on importance of follow throguh with PT services

## 2024-02-26 ENCOUNTER — Encounter: Primary: Internal Medicine

## 2024-02-27 ENCOUNTER — Encounter: Payer: MEDICARE | Attending: Student in an Organized Health Care Education/Training Program | Primary: Internal Medicine

## 2024-02-27 NOTE — Unmapped (Signed)
 Verbal SOC was obtained on 02/11/2024.

## 2024-02-27 NOTE — Unmapped (Signed)
 I certify that this patient is confined to his/her home and needs intermittent skilled nursing care, physical therapy/ and/or speech therapy or continues to need occupational therapy.  This patient is under my care and I have authorized the services on this plan of care and will periodically review the plan. I further certify that this patient had a Face-to-Face Encounter performed by Dean Magauran, MD on   02/10/2024 or allowed non-physician practitioner that was related to the primary reason the patient requires Home Health services.

## 2024-02-28 ENCOUNTER — Encounter: Admit: 2024-02-28 | Discharge: 2024-02-28 | Payer: MEDICARE | Primary: Internal Medicine

## 2024-02-28 NOTE — Home Health (Signed)
 73 y/o female admitted to [facility] on 02/09/24 with fall at home resulting in R scalp laceration after hitting head on coffee table.  Discharged to home 02/10/24 with referral to [facility]. Patient has 8 falls over the course of 6 months. Patient seen today for follow up PT visit. Denies any falls since last visit. Reports that she has been completing her HEP that was established last visit. She is wearing a compression sock on L ankle which has helped with her swelling. States that the therex, elevation, and compression have all helped to improve her ankle pain. Patient expressed that she has a high fear of falling since her last fall. She does not want to participate in standing therex at this time. Instructed to continue with seated and supine therex at this time. Advised to continue with use of elevation and compression of L ankle to manage swelling. Patient has a follow up with Dr. Alpheus on Monday for a second opinion regarding her surgery. Patient would like to hold PT next week and resume the following week. Patient is homebound due to being a high fall risk and requires SBA to leave the home.

## 2024-03-02 ENCOUNTER — Encounter: Admit: 2024-03-02 | Discharge: 2024-03-02 | Payer: MEDICARE | Primary: Internal Medicine

## 2024-03-03 ENCOUNTER — Encounter: Admit: 2024-03-03 | Discharge: 2024-03-03 | Payer: MEDICARE | Primary: Internal Medicine

## 2024-03-05 ENCOUNTER — Ambulatory Visit
Admit: 2024-03-05 | Discharge: 2024-03-05 | Payer: MEDICARE | Attending: Critical Care Medicine | Primary: Internal Medicine

## 2024-03-05 ENCOUNTER — Encounter: Payer: MEDICARE | Attending: Anesthesiology | Primary: Internal Medicine

## 2024-03-05 DIAGNOSIS — J4540 Moderate persistent asthma, uncomplicated: Principal | ICD-10-CM

## 2024-03-05 MED ORDER — Breyna 160-4.5 mcg/actuation inhaler
160-4.5 | Freq: Two times a day (BID) | RESPIRATORY_TRACT | 2 refills | Status: DC
Start: 2024-03-05 — End: 2024-03-06

## 2024-03-05 NOTE — Progress Notes (Unsigned)
 [facility], INTERVENTIONAL PAIN MANAGEMENT SERVICE      PATIENT NAME:  Molly Jensen   PATIENT DOB:  1951/08/11   PCP:   RANELL LEGIONS, MD   PROVIDER:  Corean Fireman, MD  ENCOUNTER DATE: 03/05/2024         CHIEF COMPLAINT/HPI:  Molly Jensen is a pleasant 73 y.o. year old female who presents for evaluation    Chronic LBP w L leg pain attributed to L5 nerve.  Pain in leg is constant, even when sitting and located mainly inthe buttock, lateral calf and top of foot.   S/p Neusu eval Dr Alpheus who rec sx to help w the leg pain.     In the past several months, s/p multiple falls (8 falls over 7 months) that have resulted in imaging and medical care.  She reports feeling an abrupt weakness in the leg (not pain) and then being thrown to the ground. Unclear at this point what is causing the falls. Subsequent to the falls, she has suffered multiple fxs.          Molly Jensen is a 73 year old female with a history of anxiety who was seen today with her daughter for repeated falls over the past 7-8 months. Her history is very atypical in that she has had at least 8 falls over the past 7 months. Each fall came on suddenly and she feels as though she was just thrown to the ground. She has no preceding pain in her neck back or legs but just finds herself thrown to the ground due to complete collapse. She has had multiple fractures as a result and was recently hospitalized and had a workup for these falls. She was seen by Neurology and eventually Neurosurgery. It is important to note that she is also now having pain in the left buttock and leg. She states she thought she injured her ligaments but she is pointing to the buttock the posterolateral aspect of the leg into the lateral calf in the dorsum of the left foot. She states the pain in the leg is present constantly even with sitting in the best position is lying down. It is worse with upright activity. That is only been present for a month or 2 but the falls has  been occurring for the past 7 months. She does have a history of surgery at L5-S1 20 years ago. She did have an EMG in March showing a mild left S1 radiculopathy and during her hospitalization she had an MRI of the cervical spine showing some mild stenosis at C4-5 which could irritate the cord as well as lumbar stenosis and there was discussion about a possible surgery but no definitive planning given the fact her history is very atypical. She does not complain of balance difficulty on a daily basis or lower extremity weakness it is just these sudden, abrupt falls and more recently the severe left leg pain. She denies any peroneal sensory changes no loss of bowel or bladder control.         MR LUMBAR SPINE WO CONTRAST  09/18/2023    INDICATIONS: b/l leg weakness, back pain, b/l leg weakness, back pain   COMPARISON: CT lumbar spine dated 09/17/2023.   TECHNIQUE:   Multiplanar, multisequence imaging was obtained of the lumbar spine without IV contrast.  Imaging performed at 1.5 Tesla.  FINDINGS:  Numbering: The last fully formed disc space is designated as L5-S1.   Bones:There is trace anterolisthesis of L4 on L5. Sagittal alignment is  preserved at the remaining levels. Vertebral body height is maintained, without evidence of fracture. There is no marrow replacing lesion.   Discs: Multilevel loss of intervertebral disc height and signal is present, most advanced at L5-S1, where there is fatty degenerative endplate change. There is minimal degenerative endplate change at the remaining levels.   Spinal Cord: The visualized spinal cord demonstrates normal signal and contour. The conus medullaris terminates at L2-L3.   T12-L1: Trace disc bulge without spinal canal or neural foraminal stenosis.   L1-L2: Mild disc bulge without spinal canal or neural foraminal stenosis.   L2-L3: Mild disc bulge without spinal canal or neural foraminal stenosis.   L3-L4: Mild diffuse disc bulge without spinal canal or neural foraminal  stenosis.   L4-L5: Endplate uncovering secondary to anterolisthesis, diffuse disc bulge with superimposed central protrusion, ligamentum flavum thickening, and mild bilateral facet arthropathy resulting in severe spinal canal stenosis with cauda equina nerve root compression, mild right neural foraminal stenosis, and moderate left neural foraminal stenosis.    L5-S1: Disc bulge eccentric to the left with superimposed small left paramedian-caudal extrusion resulting in mild spinal canal stenosis, moderate right neural foraminal stenosis, and severe left neural foraminal stenosis.   Other: There is fatty infiltration of the paraspinal musculature. The included aorta is normal in size. A few renal cysts are present. There is a right extrarenal pelvis.   IMPRESSION:  1.  Severe spinal canal stenosis with cauda equina nerve root compression at L4-L5 secondary to anterolisthesis, ligamentum flavum thickening, and disc bulge with central protrusion.  2.  Additional severe left neural foraminal stenosis at L5-S1. Less pronounced areas of neural foraminal stenosis as above.   Molly Jensen 09/18/2023 5:30 PM      REVIEW OF SYSTEMS:  Review of Systems   Constitutional:  Positive for activity change. Negative for chills, fatigue and fever.   HENT: Negative.     Eyes: Negative.    Respiratory: Negative.  Negative for chest tightness and shortness of breath.    Cardiovascular: Negative.  Negative for chest pain.   Gastrointestinal:  Positive for constipation.   Endocrine: Negative.  Negative for cold intolerance and heat intolerance.   Genitourinary: Negative.  Negative for difficulty urinating and dysuria.   Musculoskeletal:  Positive for back pain. Negative for joint swelling and myalgias.   Skin: Negative.    Allergic/Immunologic: Negative.    Neurological: Negative.  Negative for weakness and numbness.   Hematological: Negative.    Psychiatric/Behavioral:  Positive for sleep disturbance.         PHYSICAL  EXAMINATION:  Physical Exam  Constitutional:       Appearance: Normal appearance.   HENT:      Head: Normocephalic.      Nose: Nose normal.      Mouth/Throat:      Mouth: Mucous membranes are moist.   Eyes:      Pupils: Pupils are equal, round, and reactive to light.   Cardiovascular:      Rate and Rhythm: Normal rate and regular rhythm.   Pulmonary:      Effort: Pulmonary effort is normal.   Abdominal:      Palpations: Abdomen is soft.   Musculoskeletal:      Comments: FACET LOADING - BL POSITIVE NEGATIVE ON THE RIGHT LEFT   SITTING SLR - BL POSITIVE NEGATIVE ON THE RIGHT LEFT   SIJ - COMPRESSION TEST, FABERS, GAENSLENS - BL POSITIVE NEGATIVE ON THE RIGHT LEFT   TRIGGER POINTS PRESENT -  PARASPINAL LUMBAR M GROUPS     Skin:     General: Skin is warm and dry.   Neurological:      Mental Status: She is alert and oriented to person, place, and time.      Cranial Nerves: Cranial nerves 2-12 are intact.      Sensory: Sensation is intact.      Motor: Motor function is intact.      Coordination: Coordination is intact.      Gait: Gait is intact.      Comments: 5/5 HIP FLEXION  5/5 KNEE FLEXION  5/5 DORSIFLEXION  5/5 PLANTARFLEXION   Psychiatric:         Mood and Affect: Mood normal.         Thought Content: Thought content normal.        ASSESSMENT/PLAN:  The left leg pain is in a clear L5 dermatome and correlates to the L4-5 stenosis and spondylolisthesis.  Low back pain 2/2 facet joint issues vs DDD at L5-S1  Pts imaging was reviewed in detail, especially physiology and pathology that relates to the presenting symptoms.  Pt was educated regarding their diagnosis, prognosis and treatment plan from here on.    This discussion included physical therapy, medications and injections for pain management.  Agreed tx plan is outlined below:    Lumbar radiculopathy  - radicular pain>>> axial pain  - MRI L spine to was reviewed  - PRN LESI if appropriate  - PRN TFESI for more targeted approach  - Neusu eval Dr Alpheus (7.2025): rec  decompression and fusion at L4-5 to address the instability and the severe stenosis in her left leg pain.    Lumbar spondylosis  - main additional source of axial pain today  - PRN BL L4-S1 MBB DX x2 w Local Anesthetic  - PLAN RFA if appropriate/positive dx response to the inj    Sacroilitis  - main additional source of axial pain today  - PRN SIJ inj   - SIJ stretching exercises (handout)    Myalgia, multiple sites  - CONT heat/ice  - CONT gentle stretching  - REC tx massages, Acupuncture, cupping  - REC TPIs PRN  - muscles involved:     Falls  - unclear etiology  - C stenosis mild to mod and unlikely the culprit  - REC Neuro eval recommended    Pain management  - CONT NSAIDs    PT and home rehab  - PT  - HEP    Notably  - RTC PRN to review MRI    Medical necessity for advanced imaging  - the symptoms have persisted for at least six weeks w/in the last six months  - patient reports moderate pain which has been persistent and documented as 6/10 on the numeric scale with light activity  - patient has trialed over the counter NSAIDs/Tylenol  and has noted no significant benefit  - the symptoms have resulted decline in function and poor quality of life  - the patient has participated in home exercise pgm almost daily for the past 6-8 weeks (in the last 6 months) - no change in VAS    Medical Necessity for Injection/ Comprehensive Pain Management Program  VAS: ***/10 on a numeric scale w light activity; this has persisted for more than *** months  PE: findings on physical exam are consistent w patients imaging and HPI   FX IMPAIRMENT: pain has lead to a decline in function, inability to complete ADLs and poor quality  of life  PT: has trialed and failed formal physical therapy without change in VAS or improvement in function  PT: in too much pain to participate effectively   HEP/stretching: continues to perform home exercises but limited by pain  MEDS: has trialed and failed NSAID therapy > 6 weeks  NSAIDS: NOT candidate  for NSAIDs due to   PT EDUCATION: extensive discussion w patient regarding dx; these included visuals and handouts given to patient         Problem List Items Addressed This Visit    None         Total time spent (excluding billable services): 45 minutes for this initial evaluation.      Preparing to see the patient.       [place] review and reconciliation of medical/surgical history, medications, review of systems,       Time the provider spends taking a patient's history.      Performing a medically appropriate exam and/or evaluation.      Counseling and educating the patient/family/caregiver.      Ordering medications, tests, or procedures.      Referring and communicating with other healthcare professionals.      Time spent documenting clinical information.      Independent interpretation of results and communicating results to the patient/family/caregiver and care coordination.    Portions of this note were generated with Conservation officer, historic buildings. Please excuse any sound-alike errors that may have been overlooked during my review of this note.     Molly Melder, MD   [facility], Interventional Pain Specialist            Medical History[1]  Surgical History[2]  Family History[3]   Social History[4]   Latex    Medications:  Current Outpatient Medications   Medication Instructions   . ALPRAZolam  (XANAX ) 0.5 mg, Nightly PRN   . amLODIPine (NORVASC) 5 mg, Daily   . citalopram  (CELEXA ) 20 mg, oral, Daily   . docusate sodium  (COLACE) 100 mg, oral, 2 times daily   . gabapentin  (NEURONTIN ) 300 mg, oral, 2 times daily   . lisinopril  10 mg, oral, Daily   . pravastatin  (PRAVACHOL ) 10 mg, oral, Daily   . tolterodine  LA (DETROL  LA) 4 mg, Daily   . traMADol (ULTRAM) 50 mg, Daily   . Ventolin HFA 90 mcg/actuation inhaler 1 puff, Every 4 hours PRN                [1]  Past Medical History:  Diagnosis Date   . Cervical stenosis of spinal canal    . Closed fracture of right proximal humerus    . Degenerative  joint disease    . Hypertension     . Hypertension     . Neuropathy    . Prediabetes    . Recurrent falls    [2]  Past Surgical History:  Procedure Laterality Date   . CHOLECYSTECTOMY     . CTA CHEST W AND WO CONTRAST  08/12/2023    CTA CHEST W AND WO CONTRAST 08/12/2023 [facility] CT   . HYSTERECTOMY     [3]  Family History  Problem Relation Name Age of Onset   . No Known Problems Mother     . No Known Problems Father     [4]  Social History  Socioeconomic History   . Marital status: Widowed     Spouse name: Not on file   . Number of children: Not on file   .  Years of education: Not on file   . Highest education level: Not on file   Occupational History   . Not on file   Tobacco Use   . Smoking status: Never     Passive exposure: Never   . Smokeless tobacco: Never   Vaping Use   . Vaping status: Never Used   Substance and Sexual Activity   . Alcohol use: Never   . Drug use: Never   . Sexual activity: Defer   Other Topics Concern   . Not on file   Social History Narrative   . Not on file     Social Determinants of Health     Financial Resource Strain: Low Risk  (02/11/2024)    Overall Financial Resource Strain (CARDIA)    . Difficulty of Paying Living Expenses: Not hard at all   Food Insecurity: No Food Insecurity (02/11/2024)    Hunger Vital Sign    . Worried About Programme researcher, broadcasting/film/video in the Last Year: Never true    . Ran Out of Food in the Last Year: Never true   Transportation Needs: No Transportation Needs (02/11/2024)    OASIS A1250: Transportation    . Lack of Transportation (Medical): No    . Lack of Transportation (Non-Medical): No    . Patient Unable or Declines to Respond: No   Physical Activity: Inactive (02/11/2024)    Exercise Vital Sign    . Days of Exercise per Week: 0 days    . Minutes of Exercise per Session: 0 min   Stress: Stress Concern Present (02/11/2024)    [employer] of Occupational Health - Occupational Stress Questionnaire    . Feeling of Stress : To some extent   Social Connections: Feeling  Socially Integrated (02/11/2024)    OASIS D0700: Social Isolation    . Frequency of experiencing loneliness or isolation: Never   Recent Concern: Social Connections - Socially Isolated (02/11/2024)    Social Connection and Isolation Panel [NHANES]    . Frequency of Communication with Friends and Family: Twice a week    . Frequency of Social Gatherings with Friends and Family: Once a week    . Attends Religious Services: Never    . Active Member of Clubs or Organizations: No    . Attends Banker Meetings: Never    . Marital Status: Widowed   Intimate Partner Violence: Not At Risk (02/11/2024)    Humiliation, Afraid, Rape, and Kick questionnaire    . Fear of Current or Ex-Partner: No    . Emotionally Abused: No    . Physically Abused: No    . Sexually Abused: No   Housing Stability: Low Risk  (02/11/2024)    Housing Stability Vital Sign    . Unable to Pay for Housing in the Last Year: No    . Number of Times Moved in the Last Year: 0    . Homeless in the Last Year: No

## 2024-03-05 NOTE — Progress Notes (Signed)
 [facility] - Pulmonary Clinic Note     Reason for appointment:  Follow up - Asthma    History of present illness:  Patient is 73 y.o.  I had the pleasure of seeing Molly Jensen in our pulmonary clinic today.  She has past medical history of diabetes, hypertension, neuropathy and severe spinal canal stenosis.  She is referred to us  for abnormal CAT scan.  Brief History: In September 2024 she had strep throat when she was visiting [place], she was given antibiotics. In October 2024 she tested positive for COVID-19 infection.  Subsequently she was given multiple antibiotics including azithromycin, doxycycline and Augmentin on outpatient basis.  She had persistent pulmonary symptoms.  Subsequently in December 2024 she tested positive for influenza B and was hospitalized briefly and was discharged on Tamiflu .  About a week or so later she returned to the hospital with frequent falls, was admitted for about a week for rib fractures and syncope leading to falls.  Her sputum cultures tested positive for Klebsiella at rehab prior and was given a course of levaquin .  About 3 weeks later she again went to the hospital for the third time in 08/2023 and was hospitalized for few days for severe spinal canal stenosis and recurrent falls.    Today she tells me that her dyspnea is getting better but not back to baseline.  She reports dyspnea when ambulating on ground level at long distance.  She reports intermittent cough, dry for the most part. She denies any childhood history of asthma.  She denies any prior history of pulmonary infections, pulmonary symptoms or pulmonary illnesses.  I started her on maintenance inhalers. Today she reports her cough had resolved, but now back about 4 weeks ago. Also reports a mouth sore on breo. Has a back surgery coming up in Sep.     [Following history obtained on initial encounter:  Cigarette Smoking: Never smoker   Inhalation of cigars/marijuana/vape/e-cigs/illicit  drugs: Denies  Pets: Denies  Recent travel history: [place] 05/2023.   Occupational/Inhalational exposures: Denies  Family history of pulmonary diseases: Denies  Lives: by herself]    Workup:  CT chest 02/2024: IMPRESSION:  1.  Mild bronchiectasis in the paravertebral bilateral lower lobes.  2.  No honeycombing.  3.  Mild mosaic pattern attenuation of the lung parenchyma on expiratory phase imaging. This finding is nonspecific. Correlate for small airways disease or mild hypersensitivity pneumonitis in the appropriate clinical setting.    PFTs 09/2023: Mild restriction, TLC 77% -1.79, normal DLCO. [BMI 32].    CT chest 08/2023: IMPRESSION: Right posterolateral ninth and 10th rib fractures.    CTA chest 07/2023: IMPRESSION:  1.  Mild nonspecific diffuse interstitial prominence, which could represent edema or atypical pneumonia in the appropriate clinical setting.  No evidence of pulmonary embolus.  2.  Other incidental findings as above.    Echo 07/2023: Interpretation Summary  LVEF = 60%.  The left ventricular wall motion is normal.  There is trace/physiologic mitral regurgitation present  There is trace aortic regurgitation.  The right ventricle is normal in size and function.     Current Medications: Current Medications[1]    Past Medical History:  Problem List:  2025-05: Spinal stenosis of lumbar region without neurogenic   claudication  2025-05: Primary hypertension   2025-01: Fall, initial encounter  2025-01: Multiple falls  2025-01: Recurrent falls  2025-01: Syncope, unspecified syncope type  2025-01: Syncope  2025-01: Syncope and collapse  2024-12: Influenza B  [id number]: Influenza due to influenza virus, type B  2024-06: Primary osteoarthritis of right knee  2024-06: Primary osteoarthritis of left knee  2024-06: Osteoarthritis of left knee  2024-06: Osteoarthritis of right knee  2024-06: Metatarsalgia  2024-06: Numbness of foot  2023-05: Nuclear sclerotic cataract of both eyes  2023-05: Refractive error  2023-01:  Posterior vitreous detachment of both eyes  2023-01: Vitreous hemorrhage of right eye (CMS-HCC)  Hypertension   Degenerative joint disease  Cervical stenosis of spinal canal  Prediabetes  Neuropathy  Closed fracture of right proximal humerus     Past Surgical History:  Surgical History[2]    Family History:  Family History[3]    Social History:  Social History     Socioeconomic History    Marital status: Widowed     Spouse name: Not on file    Number of children: Not on file    Years of education: Not on file    Highest education level: Not on file   Occupational History    Not on file   Tobacco Use    Smoking status: Never     Passive exposure: Never    Smokeless tobacco: Never   Vaping Use    Vaping status: Never Used   Substance and Sexual Activity    Alcohol use: Never    Drug use: Never    Sexual activity: Defer   Other Topics Concern    Not on file   Social History Narrative    Not on file     Social Determinants of Health     Financial Resource Strain: Low Risk  (02/11/2024)    Overall Financial Resource Strain (CARDIA)     Difficulty of Paying Living Expenses: Not hard at all   Food Insecurity: No Food Insecurity (02/11/2024)    Hunger Vital Sign     Worried About Running Out of Food in the Last Year: Never true     Ran Out of Food in the Last Year: Never true   Transportation Needs: No Transportation Needs (02/11/2024)    OASIS A1250: Transportation     Lack of Transportation (Medical): No     Lack of Transportation (Non-Medical): No     Patient Unable or Declines to Respond: No   Physical Activity: Inactive (02/11/2024)    Exercise Vital Sign     Days of Exercise per Week: 0 days     Minutes of Exercise per Session: 0 min   Stress: Stress Concern Present (02/11/2024)    [employer] of Occupational Health - Occupational Stress Questionnaire     Feeling of Stress : To some extent   Social Connections: Feeling Socially Integrated (02/11/2024)    OASIS D0700: Social Isolation     Frequency of experiencing  loneliness or isolation: Never   Recent Concern: Social Connections - Socially Isolated (02/11/2024)    Social Connection and Isolation Panel [NHANES]     Frequency of Communication with Friends and Family: Twice a week     Frequency of Social Gatherings with Friends and Family: Once a week     Attends Religious Services: Never     Database administrator or Organizations: No     Attends Banker Meetings: Never     Marital Status: Widowed   Intimate Partner Violence: Not At Risk (02/11/2024)    Humiliation, Afraid, Rape, and Kick questionnaire     Fear of Current or Ex-Partner: No     Emotionally Abused: No  Physically Abused: No     Sexually Abused: No   Housing Stability: Low Risk  (02/11/2024)    Housing Stability Vital Sign     Unable to Pay for Housing in the Last Year: No     Number of Times Moved in the Last Year: 0     Homeless in the Last Year: No     Allergies:  Latex    Review of Systems:  ROS pertinent to pulmonary are as per HPI. All other systems are reviewed and negative.    Vitals:  BP 102/71 (BP Location: Left arm)   Pulse 71   Temp 36.6 C (97.9 F)   Wt 76.8 kg   SpO2 97%   BMI 30.98 kg/m           Physical Exam:  Gen: NAD, sitting comfortably  HEENT: PERRL, moist mucus membranes, Mallampati 3  CV: RRR, S1 and S2  Pulm: Breathing comfortably, CTAB  Abd: soft, non tender, non distended  Ext: no edema, no cyanosis noted  Neuro: Alert, conversing well  Psych: Calm, cooperative          Assessment and Plan:  Diagnoses and all orders for this visit:    Moderate persistent asthma, uncomplicated (Multi-HCC)  -     Breyna  160-4.5 mcg/actuation inhaler; Inhale 2 puffs in the morning and at bedtime. Rinse mouth with water  after use to reduce aftertaste and incidence of candidiasis. Do not swallow.    Pre-op evaluation    Recurrent infections      1)  Moderate persistent asthma/recurrent infections  In September 2024 she had strep throat when she was visiting [place], she was given  antibiotics.   In October 2024 she tested positive for COVID-19 infection.    Subsequently she was given multiple antibiotics including azithromycin, doxycycline and Augmentin on outpatient basis.  She had persistent pulmonary symptoms.    In December 2024 she tested positive for influenza B and was hospitalized briefly and was discharged on Tamiflu .   In Jan 2025 her sputum cultures showed Klebsiella at rehab and was given a course of levaquin .    She denies any prior history of recurrent infections, she is a non smoker.  She also reported persistent intermittent dry cough, ventolin PRN helped.  PFTs 09/2023: No fixed obstruction.   HRCT in 02/2024 showed air trapping consistent with small obstructive airway disease like asthma.   I started her on breo-100 in 11/2023 with resolution of cough, no infections since.   Today reports mouth sore and also mild cough x 4 weeks.   Given clinic history, clinical response to BD therapy and expiratory air trapping on CT chest, I diagnose her with asthma.   Plan:  - Discussed diagnosis of asthma in detail with her.   - Switch breo-100 to breyna -160 with spacer due to mouth sore and also for dry mild cough x 4 weeks. Rinse mouth after every use. Continue PRN ventolin.   - Re-assess.     1b) Pre op evaluation  She is planned to undergo back surgery in Sepmtember.  As long as her cough improves on BD therapy, she would be medically optimized from a pulmonary standpoint for the procedure. Today saturating well on room air and have a clear lung exam. There are no additional pulmonary risk factors, including obstructive sleep apnea (OSA), obesity hypoventilation syndrome (OHS), pulmonary hypertension (PH) (no evidence on the most recent echocardiogram), active smoking, or recent respiratory infection.  I advised the patient that if  they experience any exacerbation or worsening of pulmonary symptoms prior to surgery, a reassessment would be necessary, and the procedure may need to be  postponed    2) Lung opacities  CTA chest in 07/2023 during hospitalization for influenza B showed no PE, showed mild bibasilar reticular opacities. No prior CT chests to compare.  CT chest in 08/2023 during hospitalization for falls/rib fractures showed worsened bilateral bibasilar reticular opacities and atelectasis.   PFTs 09/2023: Mild restriction, TLC 77% -1.79, normal DLCO. [BMI 32].  HRCT 02/2024 showed mild bronchiectasis and reticular opacities in the medial paravertebral region of bil lower lobes as well as in lingula and RML. There is air trapping on expiratory images.   She has had recurrent infections and hospitalization, this is likely a consequence of that.   Plan:  - Discussed results with her in detail today, likely consequence of previous infections. Will repeat imaging if clinically indicated.     Follow up:  3 months      I spent 42 minutes reviewing medical records, obtaining history, performing physical exam, independently interpreting results, communicating results and counseling/educating the patient/family/caregiver, documenting in the EMR and coordinating care with other providers and staff members. This time excludes other billable services.      Antonia Bar MD  Pulmonary Physician   [facility]     Disclaimer: Part of the note has been dictated using voice recognition software and hence may contain errors that may have been overlooked. If you are the patient reviewing the medical records, please note that this documentation is written by a physician to document clinical course in the most efficient way possible often using abbreviations and for other medical professionals but are not specifically written with patient consumption in mind.          [1]   Current Outpatient Medications:     Ventolin HFA 90 mcg/actuation inhaler, Inhale 1 puff every 4 (four) hours if needed., Disp: , Rfl:     ALPRAZolam  (Xanax ) 0.5 mg tablet, Take 0.5 mg by mouth if needed at bedtime  for anxiety. Indications: anxious, Disp: , Rfl:     amLODIPine (Norvasc) 5 mg tablet, Take 5 mg by mouth once daily., Disp: , Rfl:     Breyna  160-4.5 mcg/actuation inhaler, Inhale 2 puffs in the morning and at bedtime. Rinse mouth with water  after use to reduce aftertaste and incidence of candidiasis. Do not swallow., Disp: 3 each, Rfl: 2    citalopram  (CeleXA ) 20 mg tablet, Take 1 tablet (20 mg) by mouth once daily., Disp: 30 tablet, Rfl: 0    docusate sodium  (Colace) 100 mg capsule, Take 1 capsule (100 mg) by mouth twice daily., Disp: 60 capsule, Rfl: 0    gabapentin  (Neurontin ) 300 mg capsule, TAKE 1 CAPSULE(300 MG) BY MOUTH TWICE DAILY, Disp: 60 capsule, Rfl: 3    lisinopril  10 mg tablet, Take 1 tablet (10 mg) by mouth once daily., Disp: 90 tablet, Rfl: 3    pravastatin  (Pravachol ) 10 mg tablet, Take 1 tablet (10 mg) by mouth once daily., Disp: 90 tablet, Rfl: 3    tolterodine  LA (Detrol  LA) 4 mg 24 hr capsule, Take 4 mg by mouth once daily. Indications: overactive bladder, Disp: , Rfl:     traMADol (Ultram) 50 mg tablet, Take 50 mg by mouth 1 (one) time each day. 1 tablet at night  Indications: neuropathic pain, Disp: , Rfl:   [2]   Past Surgical History:  Procedure Laterality Date  CHOLECYSTECTOMY      CTA CHEST W AND WO CONTRAST  08/12/2023    CTA CHEST W AND WO CONTRAST 08/12/2023 [facility] CT    HYSTERECTOMY     [3]   Family History  Problem Relation Name Age of Onset    No Known Problems Mother      No Known Problems Father

## 2024-03-06 MED ORDER — Breyna 160-4.5 mcg/actuation inhaler
160-4.5 | Freq: Two times a day (BID) | RESPIRATORY_TRACT | 2 refills | Status: AC
Start: 2024-03-06 — End: 2024-06-04

## 2024-03-09 ENCOUNTER — Encounter: Primary: Internal Medicine

## 2024-03-10 ENCOUNTER — Encounter: Admit: 2024-03-10 | Discharge: 2024-03-10 | Payer: MEDICARE | Primary: Internal Medicine

## 2024-03-10 NOTE — Home Health (Signed)
 73 y/o female admitted to [facility] on 02/09/24 with fall at home resulting in R scalp laceration after hitting head on coffee table. Patient reports B LE imbalance since January 2025. Discharged to home 02/10/24 with referral to [facility]. Seen for PT visit today. Patient states that she has met with Dr. Alpheus to consult on back surgery. She is planning to have back surgery in September. After discussion with patient, she feels that she does not need further PT at this time. Discussed PT role post surgery. Patient has had no falls since last visit. Instructed to continue with seated HEP. Reviewed meds and no issues found. Plan to DC patient at this time.

## 2024-03-11 ENCOUNTER — Encounter: Primary: Internal Medicine

## 2024-03-16 ENCOUNTER — Inpatient Hospital Stay: Admit: 2024-03-16 | Payer: MEDICARE | Primary: Internal Medicine

## 2024-03-16 DIAGNOSIS — M5416 Radiculopathy, lumbar region: Principal | ICD-10-CM

## 2024-03-17 ENCOUNTER — Encounter: Primary: Internal Medicine

## 2024-03-19 ENCOUNTER — Encounter: Primary: Internal Medicine

## 2024-03-20 ENCOUNTER — Ambulatory Visit: Admit: 2024-03-20 | Discharge: 2024-03-20 | Payer: MEDICARE | Primary: Internal Medicine

## 2024-03-20 DIAGNOSIS — I77810 Thoracic aortic ectasia: Principal | ICD-10-CM
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
## 2024-03-24 ENCOUNTER — Encounter: Primary: Internal Medicine

## 2024-03-26 ENCOUNTER — Encounter: Primary: Internal Medicine

## 2024-03-31 ENCOUNTER — Encounter: Primary: Internal Medicine

## 2024-04-01 ENCOUNTER — Inpatient Hospital Stay: Admit: 2024-04-01 | Payer: MEDICARE | Primary: Internal Medicine

## 2024-04-01 DIAGNOSIS — M810 Age-related osteoporosis without current pathological fracture: Principal | ICD-10-CM

## 2024-04-01 DIAGNOSIS — S2249XS Multiple fractures of ribs, unspecified side, sequela: Principal | ICD-10-CM

## 2024-04-02 ENCOUNTER — Encounter: Primary: Internal Medicine

## 2024-04-02 NOTE — Progress Notes (Signed)
 Non Epic Referral Triage -    Submitted by - PCP  Reason for Referral - SDOH Needs and Behavioral Health Resource Coordination  [facility]? - No  Episode Type - CRC and BHIP  Referral Method - Fax  Referral Accepted - Yes  Rejection Reason - N/A

## 2024-04-03 NOTE — Progress Notes (Signed)
 CHW contacted pt for first attempt to discuss referral from PCP on this date. CHW lvm w/cb information. CHW to attempt a second call.  CHW sent pt a message via [facility] portal.

## 2024-04-07 ENCOUNTER — Encounter: Primary: Internal Medicine

## 2024-04-07 NOTE — Progress Notes (Signed)
 CHW contacted pt for second attempt to discuss referral from PCP on this date. CHW explained role and purpose fo outreach.  Pt declined [facility] needs and reported wanting to hire a private nurse after discharge post-op. Pt will discuss possible options with surgeon's office.  CHW to notify referral source and close CRC.

## 2024-04-08 NOTE — Progress Notes (Signed)
 Non Epic Referral Triage -    Submitted by - PCP  Reason for Referral - SDOH Needs  [facility]? - No  Episode Type - CRC  Referral Method - Fax  Referral Accepted - Yes  Rejection Reason - N/A

## 2024-04-09 ENCOUNTER — Encounter: Primary: Internal Medicine

## 2024-04-13 ENCOUNTER — Encounter: Admit: 2024-04-13 | Discharge: 2024-04-13 | Payer: MEDICARE | Primary: Internal Medicine

## 2024-04-13 DIAGNOSIS — Z01818 Encounter for other preprocedural examination: Principal | ICD-10-CM

## 2024-04-13 MED ORDER — regadenoson (Lexiscan) radio-isotope injection 0.4 mg
0.4 | Freq: Once | INTRAVENOUS | Status: AC
Start: 2024-04-13 — End: 2024-04-13
  Administered 2024-04-13: 13:00:00 0.4 mg via INTRAVENOUS

## 2024-04-13 MED ORDER — technetium Tc-99m tetrofosmin radio-isotope injection 25.8 millicurie
1.38 | Freq: Once | INTRAVENOUS | Status: AC
Start: 2024-04-13 — End: 2024-04-13
  Administered 2024-04-13: 13:00:00 25.8 via INTRAVENOUS

## 2024-04-13 MED ORDER — technetium Tc-99m tetrofosmin radio-isotope injection 9.8 millicurie
1.38 | Freq: Once | INTRAVENOUS | Status: AC
Start: 2024-04-13 — End: 2024-04-13
  Administered 2024-04-13: 12:00:00 9.8 via INTRAVENOUS

## 2024-04-13 MED FILL — REGADENOSON 0.4 MG/5 ML INTRAVENOUS SYRINGE: 0.4 0.4 mg/5 mL | INTRAVENOUS | Qty: 5 | Fill #0

## 2024-04-13 MED FILL — KIT FOR THE PREPARATION OF TC-99M-TETROFOSMIN 1.38 MG IV SOLUTION: 1.38 1.38 mg | INTRAVENOUS | Qty: 30 | Fill #0

## 2024-04-22 ENCOUNTER — Inpatient Hospital Stay
Admit: 2024-04-22 | Discharge: 2024-04-22 | Disposition: A | Discharge status: Home / Self Care | Payer: MEDICARE | Arrived: AM | Attending: Emergency Medicine

## 2024-04-22 ENCOUNTER — Emergency Department: Admit: 2024-04-22 | Payer: MEDICARE | Primary: Internal Medicine

## 2024-04-22 DIAGNOSIS — S0990XA Unspecified injury of head, initial encounter: Secondary | ICD-10-CM

## 2024-04-22 DIAGNOSIS — R296 Repeated falls: Principal | ICD-10-CM

## 2024-04-22 LAB — COMPREHENSIVE METABOLIC PANEL
ALT: 19 U/L (ref 0–55)
AST: 21 U/L (ref 6–42)
Albumin: 3.9 g/dL (ref 3.2–5.0)
Alkaline phosphatase: 137 U/L — ABNORMAL HIGH (ref 30–130)
Anion Gap: 7 mmol/L (ref 3–14)
BUN: 9 mg/dL (ref 6–24)
Bilirubin, total: 0.5 mg/dL (ref 0.2–1.2)
CO2 (Bicarbonate): 23 mmol/L (ref 20–32)
Calcium: 9.5 mg/dL (ref 8.5–10.5)
Chloride: 109 mmol/L (ref 98–110)
Creatinine: 1.17 mg/dL (ref 0.55–1.30)
Glucose: 131 mg/dL — ABNORMAL HIGH (ref 70–110)
Potassium: 3.7 mmol/L (ref 3.6–5.2)
Protein, total: 7.7 g/dL (ref 6.0–8.4)
Sodium: 139 mmol/L (ref 135–146)
eGFRcr: 50 mL/min/1.73m*2 — ABNORMAL LOW (ref >=60)

## 2024-04-22 LAB — CBC WITH DIFFERENTIAL
Basophils %: 0.2 %
Basophils Absolute: 0.02 K/uL (ref 0.00–0.22)
Eosinophils %: 0 %
Eosinophils Absolute: 0 K/uL (ref 0.00–0.50)
Hematocrit: 45.6 % (ref 32.0–47.0)
Hemoglobin: 15.5 g/dL (ref 11.0–16.0)
Immature Granulocytes %: 0.5 %
Immature Granulocytes Absolute: 0.04 K/uL (ref 0.00–0.10)
Lymphocyte %: 10.4 %
Lymphocytes Absolute: 0.86 K/uL (ref 0.70–4.00)
MCH: 29.5 pg (ref 26.0–34.0)
MCHC: 34 g/dL (ref 31.0–37.0)
MCV: 86.9 fL (ref 80.0–100.0)
MPV: 8.6 fL — ABNORMAL LOW (ref 9.1–12.4)
Monocytes %: 0.8 %
Monocytes Absolute: 0.07 K/uL — ABNORMAL LOW (ref 0.36–0.77)
NRBC %: 0 % (ref 0.0–0.0)
NRBC Absolute: 0 K/uL (ref 0.00–2.00)
Neutrophil %: 88.1 %
Neutrophils Absolute: 7.29 K/uL (ref 1.50–7.95)
Platelets: 348 K/uL (ref 150–400)
RBC: 5.25 M/uL — ABNORMAL HIGH (ref 3.70–5.20)
RDW-CV: 15 % — ABNORMAL HIGH (ref 11.5–14.5)
RDW-SD: 48.3 fL (ref 35.0–51.0)
WBC: 8.3 K/uL (ref 4.0–11.0)

## 2024-04-22 LAB — LIGHT BLUE TOP

## 2024-04-22 LAB — RAINBOW DRAW SST GOLD TOP

## 2024-04-22 LAB — TROPONIN I, HIGH SENSITIVITY: Troponin I, High Sensitivity: 12 ng/L

## 2024-04-22 MED ORDER — acetaminophen (Tylenol) tablet 650 mg
325 | Freq: Once | ORAL | Status: AC
Start: 2024-04-22 — End: 2024-04-22
  Administered 2024-04-22: 22:00:00 650 mg via ORAL

## 2024-04-22 MED FILL — ACETAMINOPHEN 325 MG TABLET: 325 325 mg | ORAL | Qty: 2 | Fill #0

## 2024-04-22 NOTE — Discharge Instructions (Signed)
 Move slowly, use your cane if you feel more safe with that otherwise use walker or wheelchair as you would mentioned.  Follow-up with your primary care tomorrow, have a urine checked to make sure you do not have any urinary tract infection.    Return to the emergency department if further falls, weakness, severe headache, new numbness, weakness or new concerns

## 2024-04-22 NOTE — ED Triage Notes (Signed)
 BIBA from home for multiple falls. Per patient she had four falls today. +HS. Unknown LOC. Lives alone. Uses cane. Denies dizziness prior to falls. states she doesnt know why she keeps falling. Pt has had 14 falls over the last 9 months. Reports HA, lower back, left hip, left wrist, left elbow pain. No deformity. Bruising noted to left elbow, left hip, R eye, right temporal-various stages of bruising. Alert and oriented x4. No thinners. +nausea Hypertensive.

## 2024-04-22 NOTE — ED Provider Notes (Signed)
 [facility] EMERGENCY DEPARTMENT MAIN CAMP[facility]  [address]  [place] [address]  04/22/24  4:04 PM     PATIENT  Molly Jensen  DOB: 27-Jul-1951, MRN: [id number]  History source: Patient, EMS, medical records  Arrival: EMS  History limitation: none      CHIEF COMPLAINT  Mckynzie is a 73 y.o. female who presents to the ER for Fall      HISTORY OF PRESENT ILLNESS  HPI  The patient is a 73 y.o. female  has a past medical history of Cervical stenosis of spinal canal, Closed fracture of right proximal humerus, Degenerative joint disease, Hypertension, Hypertension, Neuropathy, Prediabetes, and Recurrent falls. who presents to the ER for evaluation of Fall  73 year old female with a history of cervical stenosis, lumbar stenosis, hypertension, hyperlipidemia, neuropathy and recurrent falls, here with multiple falls.  Patient states that she typically falls backwards, today she had fallen forward which was different.  She noted increasing contusion around her eye and came to the ER.  She states she fell a total of 4 times today.  She typically only falls once a month.  She has a headache on the right frontal region where she hit her head, bruising on the left elbow, right periorbital region and right temporal region.  She has no pain in the lower extremity, full range of motion of the hip and no pain in the hip or pelvis.  No chest or abdominal pain.  She states she has in-home physical therapy, last went to rehab in February 2025 which did not help.  She states that the physical therapists state there is nothing they can do for her other than to continue to help her know how to fall.  She states that none of the doctors know why she falls frequently.  She states that suddenly feels like she is pushed and ends up on the ground.  She does not take blood thinners      REVIEW OF SYSTEMS  Review of Systems   Constitutional:  Negative for fever.   Respiratory:  Negative for cough and shortness of breath.    Cardiovascular:  Negative  for chest pain.   Gastrointestinal:  Negative for abdominal pain and vomiting.   Genitourinary:  Negative for dysuria.   Musculoskeletal:  Positive for gait problem.   Skin:  Negative for pallor.   Neurological:  Negative for seizures, syncope, weakness, numbness and headaches.   Hematological:  Does not bruise/bleed easily.     A 10 point review of systems was otherwise negative other than pertinent positives and negatives as noted in HPI, including Const, Head, Neck, HENT, CV, Pulm, GI, GU, Skin, MSK, Neuro, Psych.     HEALTH STATUS  Allergies[1]   Immunization History   Administered Date(s) Administered    Influenza, trivalent, adjuvanted 09/19/2023     E-cigarette/Vaping       Questions Responses    E-cigarette/Vaping Use Never User    Passive Exposure No    Counseling Given No          E-cigarette/Vaping Substances       Questions Responses    Nicotine No    THC No    CBD No    Flavoring No          E-cigarette/Vaping Devices       Questions Responses    Disposable No    Pre-filled or Refillable Cartridge No    Refillable Tank No    Pre-filled Pod No  PATIENT HISTORY  MEDICAL PROBLEMS  Medical History[2]  SURGERIES  Surgical History[3]  FAMILY HISTORY  Family History[4]  SOCIAL HISTORY  Social History     Tobacco Use    Smoking status: Never     Passive exposure: Never    Smokeless tobacco: Never   Vaping Use    Vaping status: Never Used   Substance Use Topics    Alcohol use: Never    Drug use: Never       PHYSICAL EXAM  FIRST VITAL SIGNS  Temp: 36.5 C (97.7 F) Oral  Pulse: 94  BP: (!) 168/105  Resp: 28  SpO2: 96 % Oxygen Therapy: None (Room air)  No data recorded    Physical Exam  Vitals and nursing note reviewed.   Constitutional:       General: She is not in acute distress.     Appearance: Normal appearance. She is well-developed.   HENT:      Head: Normocephalic.      Jaw: There is normal jaw occlusion.        Nose: Nose normal.      Mouth/Throat:      Mouth: Mucous membranes are moist.    Eyes:      Extraocular Movements: Extraocular movements intact.      Pupils: Pupils are equal, round, and reactive to light.   Cardiovascular:      Rate and Rhythm: Normal rate and regular rhythm.   Pulmonary:      Effort: Pulmonary effort is normal.      Breath sounds: Normal breath sounds. No wheezing, rhonchi or rales.   Abdominal:      Palpations: Abdomen is soft.      Tenderness: There is no abdominal tenderness.   Musculoskeletal:         General: Normal range of motion.      Cervical back: Normal range of motion.   Skin:     General: Skin is warm and dry.   Neurological:      General: No focal deficit present.      Mental Status: She is alert.           MEDICAL DECISION MAKING  Medical Decision Making  Differential diagnosis includes closed head injury, facial fractures, contusion, abrasion, anemia, dehydration, electrolyte abnormality, arrhythmia.  Will check CBC, CMP, urinalysis, EKG, troponin, head, max face and cervical spine CT and reevaluate.    Problems Addressed:  Closed head injury, initial encounter: complicated acute illness or injury  Multiple falls: complicated acute illness or injury    Amount and/or Complexity of Data Reviewed  Independent Historian: EMS  Labs: ordered. Decision-making details documented in ED Course.  Radiology: ordered and independent interpretation performed. Decision-making details documented in ED Course.  ECG/medicine tests: ordered and independent interpretation performed. Decision-making details documented in ED Course.    Risk  OTC drugs.  Decision regarding hospitalization.       Reviewed and confirmed history obtained in nursing triage notes regarding PMH and chart review performed of prior records as available to obtain additional history, past social history, and past family history as needed.      RESULTS  ECG 12 lead   Final Result by Interface, Lab Results In (09/03 1522)   HEART RATE: 93   RR Interval: 648   P-R Interval: 170   P Duration: 124   P Front Axis:  44   QRSD Interval: 85   QT Interval: 401   QTcB: 498   QTcF:  464   QRS Axis: -29   T Wave Axis: 14   QTc [place]: 455   QTc Hodges: 458   ECG Severity:    - NORMAL ECG -   ECG Impression:    Sinus rhythm   Abnormal R-wave progression, early transition   No significant change compared to prior         EKG was reviewed independently by me and shows sinus rhythm at 93, normal axis, QTc of 458, no ST elevations or depressions, no acute ischemic changes; compared to prior on 03/20/2024, no significant changes noted, today QTc is slightly longer than prior 425    Labs Reviewed   COMPREHENSIVE METABOLIC PANEL - Abnormal       Result Value    Sodium 139      Potassium 3.7      Chloride 109      CO2 (Bicarbonate) 23      Anion Gap 7      BUN 9      Creatinine 1.17      eGFRcr 50 (*)     Glucose 131 (*)     Fasting? Unknown      Calcium 9.5      AST 21      ALT 19      Alkaline phosphatase 137 (*)     Protein, total 7.7      Albumin 3.9      Bilirubin, total 0.5     CBC WITH DIFFERENTIAL - Abnormal    WBC 8.3      RBC 5.25 (*)     Hemoglobin 15.5      Hematocrit 45.6      MCV 86.9      MCH 29.5      MCHC 34.0      RDW-CV 15.0 (*)     RDW-SD 48.3      Platelets 348      MPV 8.6 (*)     Neutrophil % 88.1      Lymphocyte % 10.4      Monocytes % 0.8      Eosinophils % 0.0      Basophils % 0.2      Immature Granulocytes % 0.5      NRBC % 0.0      Neutrophils Absolute 7.29      Lymphocytes Absolute 0.86      Monocytes Absolute 0.07 (*)     Eosinophils Absolute 0.00      Basophils Absolute 0.02      Immature Granulocytes Absolute 0.04      NRBC Absolute 0.00     TROPONIN I, HIGH SENSITIVITY - Normal    Troponin I, High Sensitivity 12      Narrative:     Clinical correlation, HEART Score and shared decision making must be taken into account.                   For Chest Pain >3 Hours    Rule-Out Criteria              Single Value     0hr/1hr Delta Value  Female  <10ng/L*          <54ng/L AND delta <15ng/L  Female    <10ng/L*           <79ng/L AND delta <15ng/L    Cannot Rule-Out  0hr/1hr Delta Value  Female    <54ng/L AND delta >15ng/L    >/= 54 AND delta </=15ng/L  Female      <79ng/L AND delta >15ng/L    >/= 79 AND delta </=15ng/L    Rule-In Criteria               Single Value   0hr/1hr Delta Value                 Female   >115ng/L       >/=54ng/L AND delta >/=15ng/L         Female     >115ng/L       >/=79ng/L AND delta >/=15ng/L           *Note: for Chest pain <3 hours 0hr/1hr is warranted for evaluation.     CBC W/DIFF    Narrative:     The following orders were created for panel order CBC and differential.  Procedure                               Abnormality         Status                     ---------                               -----------         ------                     CBC w/ Differential[[id number]]          Abnormal            Final result                 Please view results for these tests on the individual orders.      Labs were reviewed independently by me and CBC with a normal white count of 8.3 H&H of 15.5 and 45.6 platelets of 348, CMP is grossly unremarkable with creatinine of 1.17 normal potassium, normal LFTs and bilirubin, troponin of 12    CT HEAD WO CONTRAST   Final Result      1.  No acute territorial infarct, space-occupying lesion, or intracranial hemorrhage.   2.  Thin right frontal scalp hematoma without evidence of facial bone or calvarial fracture.      Lamar Splinter 04/22/2024 3:57 PM      CT CERVICAL SPINE WO CONTRAST   Final Result      No acute fracture or traumatic malalignment of the cervical spine.      Lamar Splinter 04/22/2024 4:03 PM      CT MAXILLOFACIAL WO CONTRAST   Final Result      1.  No acute territorial infarct, space-occupying lesion, or intracranial hemorrhage.   2.  Thin right frontal scalp hematoma without evidence of facial bone or calvarial fracture.      Lamar Splinter 04/22/2024 3:57 PM        Images were reviewed independently by me and shows no acute intracranial  hemorrhage, skull fracture, facial fracture, cervical spine fracture or dislocation        ED TREATMENTS  Medications   acetaminophen  (Tylenol ) tablet 650 mg (650 mg oral Given 04/22/24 1814)  Procedures        REEVALUATION/CONSULTS  ED Course as of 04/24/24 1803   Wed Apr 22, 2024   1751 I offered patient admission, rehab or physical therapy evaluation and she does not want to go to rehab, does not feel it helps and already has in-home physical therapy.  She does not want to stay in the hospital, states she has been dealing with this for a long time and is only requesting Tylenol .  She went to the commode twice, denies any dysuria and states that she does not want to wait for another urine and will have it checked with her primary care doctor.   1757 Patient son-in-law is here now, he was also present when I offered patient admission or further treatment or evaluation.  He will be staying with her tonight, there were discussing a wheelchair but Dr. Alpheus had recommended against it, patient thinks she may get 1 to use intermittently.  She has surgery with him in about 2 weeks for her lumbar spine.  Patient's son states that she did fall yesterday the contusion on her hand and her elbow or from that fall, she has no bony tenderness and he had bought her a brace for the wrist.  She again declines admission.  She is alert and oriented x 3, her workup is actually quite reassuring despite the multiple falls.  Family and patient feel comfortable with taking her home.  Will ensure she can ambulate with a cane safely in the ER and okay for discharge if this is met         Diagnoses as of 04/24/24 1803   Multiple falls   Closed head injury, initial encounter        Final diagnoses:   [R29.6] Multiple falls   [S09.90XA] Closed head injury, initial encounter        DISCHARGE PLAN  CONDITION: Stable  DISPOSITION: Discharge       Medication List      ASK your doctor about these medications     ALPRAZolam  0.5 mg tablet;  Commonly known as: Xanax    amLODIPine 5 mg tablet; Commonly known as: Norvasc   Breyna  160-4.5 mcg/actuation inhaler; Generic drug:   budesonide-formoteroL; Inhale 2 puffs in the morning and at bedtime. Rinse   mouth with water  after use to reduce aftertaste and incidence of   candidiasis. Do not swallow.   citalopram  20 mg tablet; Commonly known as: CeleXA ; Take 1 tablet (20   mg) by mouth once daily.   docusate sodium  100 mg capsule; Commonly known as: Colace; Take 1   capsule (100 mg) by mouth twice daily.   gabapentin  300 mg capsule; Commonly known as: Neurontin ; TAKE 1   CAPSULE(300 MG) BY MOUTH TWICE DAILY   lisinopril  10 mg tablet; Take 1 tablet (10 mg) by mouth once daily.   pravastatin  10 mg tablet; Commonly known as: Pravachol ; Take 1 tablet   (10 mg) by mouth once daily.   tolterodine  LA 4 mg 24 hr capsule; Commonly known as: Detrol  LA   traMADol 50 mg tablet; Commonly known as: Ultram   Ventolin HFA 90 mcg/actuation inhaler; Generic drug: albuterol             [1]   Allergies  Allergen Reactions    Latex Rash     Red bumps in mouth   [2]   Past Medical History:  Diagnosis Date    Cervical stenosis of spinal canal     Closed fracture  of right proximal humerus     Degenerative joint disease     Hypertension      Hypertension      Neuropathy     Prediabetes     Recurrent falls    [3]   Past Surgical History:  Procedure Laterality Date    CHOLECYSTECTOMY      CTA CHEST W AND WO CONTRAST  08/12/2023    CTA CHEST W AND WO CONTRAST 08/12/2023 [facility] CT    HYSTERECTOMY     [4]   Family History  Problem Relation Name Age of Onset    No Known Problems Mother      No Known Problems Father          Laymon Better, MD  04/24/24 [phone]

## 2024-04-22 NOTE — Other (Signed)
 Patient Education  Table of Contents   Head Injury, Adult   Fall Prevention in the Home, Adult    To view videos and all your education online visit,  https://pe.elsevier.com/xxwAR1Fv  or scan this QR code with your smartphone.  Access to this content will expire in one year.  Head Injury, Adult    There are many types of head injuries. They can be as minor as a small bump. Some head injuries can be worse. Worse injuries include:   A strong hit to the head that shakes the brain back and forth, causing damage (concussion).   A bruise (contusion) of the brain. This means there is bleeding in the brain that can cause swelling.   A cracked skull (skull fracture).   Bleeding in the brain that gathers, gets thick (makes a clot), and forms a bump (hematoma).  Most problems from a head injury come in the first 24 hours. However, you may still have side effects up to 7?10 days after your injury. It is important to watch your condition for any changes. You may need to be watched in the emergency department or urgent care, or you may need to stay in the hospital.  What are the causes?  There are many possible causes of a head injury. A serious head injury may be caused by:   A car accident.   Bicycle or motorcycle accidents.   Sports injuries.   Falls.   Being hit by an object.  What are the signs or symptoms?  Symptoms of a head injury include a bruise, bump, or bleeding where the injury happened. Other physical symptoms may include:   Headache.   Feeling like you may vomit (nauseous) or vomiting.   Dizziness.   Blurred or double vision.   Being uncomfortable around bright lights or loud noises.   Feeling tired.   Trouble waking up.   Severe symptoms such as:  ? Feeling weak or numb on one side of the body.  ? Slurred speech.  ? Swallowing problems.  ? Fainting.  ? Shaking movements that you cannot control (seizures).  Mental or emotional symptoms may include:   Feeling grumpy or cranky.   Confusion and memory  problems.   Having trouble paying attention or concentrating.   Changes in eating or sleeping habits.   Feeling worried or nervous (anxious).   Feeling sad (depressed).  How is this treated?  Treatment for this condition depends on how bad the injury is and the type of injury you have. The main goal is to prevent problems and to allow the brain time to heal.  Mild head injury  If you have a mild head injury, you may be sent home, and treatment may include:   Being watched. A responsible adult should stay with you for 24 hours after your injury and check on you often.   Physical rest.   Brain rest.   Pain medicines.  Very bad head injury  If you have a very bad head injury, treatment may include:   Being watched closely. This includes staying in the hospital.   Medicines to:  ? Help with pain.  ? Prevent seizures.  ? Help with brain swelling.   Protecting your airway and using a machine that helps you breathe (ventilator).   Watching for and manage swelling inside the brain.   Brain surgery. This may be needed to:  ? Remove a collection of blood or blood clots.  ? Stop the bleeding.  ?  Remove a part of the skull. This allows room for the brain to swell.  Follow these instructions at home:  Activity   Rest.   Avoid activities that are hard or tiring.   Make sure you get enough sleep.   Let your brain rest. Do fewer activities that need a lot of thought or attention, such as:  ? Watching TV.  ? Playing memory games and doing puzzles.  ? Job-related work or homework.  ? Working on [employer], and texting.   Avoid activities that could cause another head injury until your doctor says it is okay. This includes playing sports.   Ask your doctor when it is safe for you to go back to your normal activities, such as work or school.   Ask your doctor when you can drive, ride a bicycle, or use machines. Do not do these activities if you are dizzy.  Lifestyle     Do not drink alcohol until your doctor says it is  okay.   Do not use drugs.   If it is hard to remember things, write them down.   If you are easily distracted, try to do one thing at a time.   Talk with family members or close friends when making important decisions.   Tell your friends, family, a trusted co-worker, and work Production designer, theatre/television/film about your injury, symptoms, and limits (restrictions). Have them watch for any problems that are new or getting worse.  General instructions   Take over-the-counter and prescription medicines only as told by your doctor.   Have a responsible adult stay with you for 24 hours after your head injury. This person should watch you for any changes in your symptoms and be ready to get help.   Keep all follow-up visits to catch any new problems early.  How is this prevented?  Having another head injury can be dangerous. Another injury can lead to brain damage, brain swelling, or death. You can avoid this by:   Working on Therapist, music. This can help you avoid falls.   Wearing a seat belt when you are in a moving vehicle.   Wearing a helmet when you:  ? Ride a bicycle.  ? Ski.  ? Do any other sport or activity that has a risk of injury.   Making your home safer by:  ? Getting rid of clutter from the floors and stairs. This includes things that can make you trip.  ? Using grab bars in bathrooms and handrails by stairs.  ? Placing non-slip mats on floors and in bathtubs.  ? Putting more light in dim areas.  Where to find more information   Brain Injury Association: biausa.org  Contact a doctor if:   These symptoms do not go away:  ? Headaches.  ? Dizziness.  ? Double vision or vision changes.  ? Trouble sleeping.  ? Changes in mood.   You have new symptoms.  Get help right away if:   You have sudden:  ? Headache that is very bad.  ? Vomiting that does not stop.  ? Changes in the size of one of your pupils. Pupils are the black centers of your eyes.  ? Changes in how you see (vision).  ? More confusion or more grumpy moods.   You have  a seizure.   Your symptoms get worse.   You have a clear or bloody fluid coming from your nose or ears.  These symptoms may be an  emergency. Get help right away. Call 911.   Do not wait to see if the symptoms will go away.   Do not drive yourself to the hospital.  This information is not intended to replace advice given to you by your health care provider. Make sure you discuss any questions you have with your health care provider.  Document Released: 2008-07-19 Document Updated: 2022-05-24 Document Reviewed: 2022-05-24  Elsevier Patient Education ? 2025 [employer].  Fall Prevention in the Home, Adult  Falls can cause injuries and can happen to people of all ages. There are many things you can do to make your home safer and to help prevent falls.  What actions can I take to prevent falls?  General information   Use good lighting in all rooms. Make sure to:   ? Replace any light bulbs that burn out.  ? Turn on the lights in dark areas and use night-lights.   Keep items that you use often in easy-to-reach places. Lower the shelves around your home if needed.   Move furniture so that there are clear paths around it.   Do not use throw rugs or other things on the floor that can make you trip.   If any of your floors are uneven, fix them.   Add color or contrast paint or tape to clearly mark and help you see:  ? Grab bars or handrails.  ? First and last steps of staircases.  ? Where the edge of each step is.   If you use a ladder or stepladder:  ? Make sure that it is fully opened. Do not climb a closed ladder.  ? Make sure the sides of the ladder are locked in place.  ? Have someone hold the ladder while you use it.   Know where your pets are as you move through your home.  What can I do in the bathroom?       Keep the floor dry. Clean up any water  on the floor right away.   Remove soap buildup in the bathtub or shower. Buildup makes bathtubs and showers slippery.   Use non-skid mats or decals on the floor of the  bathtub or shower.   Attach bath mats securely with double-sided, non-slip rug tape.   If you need to sit down in the shower, use a non-slip stool.   Install grab bars by the toilet and in the bathtub and shower. Do not use towel bars as grab bars.  What can I do in the bedroom?   Make sure that you have a light by your bed that is easy to reach.   Do not use any sheets or blankets on your bed that hang to the floor.   Have a firm chair or bench with side arms that you can use for support when you get dressed.  What can I do in the kitchen?   Clean up any spills right away.   If you need to reach something above you, use a step stool with a grab bar.   Keep electrical cords out of the way.   Do not use floor polish or wax that makes floors slippery.  What can I do with my stairs?   Do not leave anything on the stairs.   Make sure that you have a light switch at the top and the bottom of the stairs.   Make sure that there are handrails on both sides of the stairs. Fix handrails that are broken  or loose.   Install non-slip stair treads on all your stairs if they do not have carpet.   Avoid having throw rugs at the top or bottom of the stairs.   Choose a carpet that does not hide the edge of the steps on the stairs. Make sure that the carpet is firmly attached to the stairs. Fix carpet that is loose or worn.  What can I do on the outside of my home?   Use bright outdoor lighting.   Fix the edges of walkways and driveways and fix any cracks. Clear paths of anything that can make you trip, such as tools or rocks.   Add color or contrast paint or tape to clearly mark and help you see anything that might make you trip as you walk through a door, such as a raised step or threshold.   Trim any bushes or trees on paths to your home.   Check to see if handrails are loose or broken and that both sides of all steps have handrails. Install guardrails along the edges of any raised decks and porches.   Have leaves, snow, or ice  cleared regularly. Use sand, salt, or ice melter on paths if you live where there is ice and snow during the winter.   Clean up any spills in your garage right away. This includes grease or oil spills.  What other actions can I take?   Review your medicines with your doctor. Some medicines can cause dizziness or changes in blood pressure, which increase your risk of falling.   Wear shoes that:  ? Have a low heel. Do not wear high heels.  ? Have rubber bottoms and are closed at the toe.  ? Feel good on your feet and fit well.   Use tools that help you move around if needed. These include:  ? Canes.  ? Walkers.  ? Scooters.  ? Crutches.   Ask your doctor what else you can do to help prevent falls. This may include seeing a physical therapist to learn to do exercises to move better and get stronger.  Where to find more information   Centers for Disease Control and Prevention, STEADI: TonerPromos.no   [employer] on Aging: BaseRingTones.pl   National Institute on Aging: BaseRingTones.pl  Contact a doctor if:   You are afraid of falling at home.   You feel weak, drowsy, or dizzy at home.   You fall at home.  Get help right away if you:   Lose consciousness or have trouble moving after a fall.   Have a fall that causes a head injury.  These symptoms may be an emergency. Get help right away. Call 911.   Do not wait to see if the symptoms will go away.   Do not drive yourself to the hospital.  This information is not intended to replace advice given to you by your health care provider. Make sure you discuss any questions you have with your health care provider.  Document Released: 2009-06-02 Document Updated: 2022-04-09 Document Reviewed: 2022-04-09  Elsevier Patient Education ? 2025 [employer].

## 2024-04-27 ENCOUNTER — Inpatient Hospital Stay: Admit: 2024-04-27 | Payer: MEDICARE | Primary: Internal Medicine

## 2024-04-27 ENCOUNTER — Ambulatory Visit: Admit: 2024-04-27 | Discharge: 2024-04-27 | Payer: MEDICARE | Primary: Internal Medicine

## 2024-04-27 DIAGNOSIS — Z01812 Encounter for preprocedural laboratory examination: Secondary | ICD-10-CM

## 2024-04-27 DIAGNOSIS — Z01811 Encounter for preprocedural respiratory examination: Secondary | ICD-10-CM

## 2024-04-27 DIAGNOSIS — Z01818 Encounter for other preprocedural examination: Principal | ICD-10-CM

## 2024-04-27 LAB — CBC
Hematocrit: 44.2 % (ref 32.0–47.0)
Hemoglobin: 14.5 g/dL (ref 11.0–16.0)
MCH: 29.7 pg (ref 26.0–34.0)
MCHC: 32.8 g/dL (ref 31.0–37.0)
MCV: 90.4 fL (ref 80.0–100.0)
MPV: 8.7 fL — ABNORMAL LOW (ref 9.1–12.4)
NRBC %: 0 % (ref 0.0–0.0)
NRBC Absolute: 0 K/uL (ref 0.00–2.00)
Platelets: 396 K/uL (ref 150–400)
RBC: 4.89 M/uL (ref 3.70–5.20)
RDW-CV: 15.3 % — ABNORMAL HIGH (ref 11.5–14.5)
RDW-SD: 51.1 fL — ABNORMAL HIGH (ref 35.0–51.0)
WBC: 9.7 K/uL (ref 4.0–11.0)

## 2024-04-27 LAB — URINALYSIS
Bacteria, Ur: NONE SEEN
Bilirubin, Ur: NEGATIVE
Blood, Ur: NEGATIVE
Casts, Ur: 2 /LPF (ref 0–4)
Epithelial Cells, UR: 4 {cells}/[HPF] (ref 0–5)
Glucose,Ur: NEGATIVE mg/dL
Nitrite, Ur: NEGATIVE
RBC, Ur: 1 {cells}/[HPF] (ref 0–4)
Specific Gravity, Ur: 1.02 (ref 1.005–1.030)
Urobilinogen, Ur: 1 U/dL (ref 0.2–1.0)
WBC, Ur: 3 {cells}/[HPF] (ref 0–5)
pH, Ur: 5.5 (ref 5.0–8.0)

## 2024-04-27 LAB — PROTIME-INR
INR: <1
Protime: 10.5 s (ref 9.3–11.6)

## 2024-04-27 LAB — MRSA/SA BY PCR
MRSA: NOT DETECTED
S. aureus PCR: NOT DETECTED

## 2024-04-27 LAB — ELECTROLYTES PANEL
Anion Gap: 6 mmol/L (ref 3–14)
CO2 (Bicarbonate): 26 mmol/L (ref 20–32)
Chloride: 107 mmol/L (ref 98–110)
Potassium: 4.2 mmol/L (ref 3.6–5.2)
Sodium: 139 mmol/L (ref 135–146)

## 2024-04-27 LAB — PTT: aPTT: 26 s (ref 23–32)

## 2024-04-27 NOTE — Procedures (Signed)
 Medication List            Accurate as of April 27, 2024  8:33 AM. Always use your most recent med list.                acetaminophen  500 mg tablet  Commonly known as: Tylenol   Medication Adjustments for Surgery: Other (Comment)  Notes to patient: DO NOT HAVE TO STOP, TAKE IF NEEDED      ALPRAZolam  0.5 mg tablet  Commonly known as: Xanax   Medication Adjustments for Surgery: Other (Comment)  Notes to patient: DO NOT HAVE TO STOP, TAKE IF NEEDED - DO NOT TAKE WITH TRAMADOL     amLODIPine 5 mg tablet  Commonly known as: Norvasc  Medication Adjustments for Surgery: Take morning of surgery with sip of water , no other fluids     Breyna  160-4.5 mcg/actuation inhaler  Generic drug: budesonide-formoteroL  Inhale 2 puffs in the morning and at bedtime. Rinse mouth with water  after use to reduce aftertaste and incidence of candidiasis. Do not swallow.  Medication Adjustments for Surgery: Take morning of surgery with sip of water , no other fluids     citalopram  20 mg tablet  Commonly known as: CeleXA   Take 1 tablet (20 mg) by mouth once daily.  Medication Adjustments for Surgery: Take morning of surgery with sip of water , no other fluids     docusate sodium  100 mg capsule  Commonly known as: Colace  Take 1 capsule (100 mg) by mouth twice daily.  Medication Adjustments for Surgery: Take morning of surgery with sip of water , no other fluids     gabapentin  300 mg capsule  Commonly known as: Neurontin   TAKE 1 CAPSULE(300 MG) BY MOUTH TWICE DAILY  Medication Adjustments for Surgery: Take morning of surgery with sip of water , no other fluids     ibuprofen  600 mg tablet  Medication Adjustments for Surgery: Stop 7 days before surgery  Notes to patient: TAKE LAST DOSE ON WEDNESDAY, 04/29/2024     lisinopril  10 mg tablet  Take 1 tablet (10 mg) by mouth once daily.  Medication Adjustments for Surgery: Other (Comment)  Notes to patient: DO NOT TAKE ON DAY OF SURGERY      pravastatin  10 mg tablet  Commonly known as: Pravachol   Take 1  tablet (10 mg) by mouth once daily.  Medication Adjustments for Surgery: Continue until night before surgery     tolterodine  LA 4 mg 24 hr capsule  Commonly known as: Detrol  LA  Medication Adjustments for Surgery: Continue until night before surgery     traMADol 50 mg tablet  Commonly known as: Ultram  Medication Adjustments for Surgery: Other (Comment)  Notes to patient: DO NOT HAVE TO STOP, TAKE IF NEEDED - DO NOT TAKE WITH ALPRAZOLAM      Ventolin HFA 90 mcg/actuation inhaler  Generic drug: albuterol  Medication Adjustments for Surgery: Other (Comment)  Notes to patient: TAKE IF NEEDED AND BRING INHALER ON DAY OF SURGERY                   Additional Instructions        Advised patient:      - Surgery time may change and if it does, they will receive a phone call after 3 pm the night before surgery (if your surgery is on a Monday, they will call on Friday).    Do not eat after midnight the night before your surgical procedure. You may drink clear liquids (water , apple juice, Gatorade)  up until 3 hours before your procedure time, unless otherwise instructed.     - One visitor is allowed at this time and they must be over the age of 43 years    - Pre-Surgical drinks (Ensure Clear) - USE ONLY IF INSTRUCTED TO DO SO    - If you use a CPAP machine at home, bring it with you on the day of surgery    - If you use a rescue inhaler (albuterol), bring it with you on the day of surgery    - Shower is okay on the morning of surgery- If given Hibiclens (special soap), use as instructed    - No shaving at or near procedure site    - Arrange an escort home and family care overnight (should be someone over 18 yrs)    - Neuro/back surgery: bring MRI CD     - Bring list of medications if appropriate    - Complete Bowel prep if appropriate    - Take medications as instructed during your Prescreening appointment    - Quit smoking to help prevent infections    - Don't wear any make up, jewelry, body piercings, or contact lenses     -  DO NOT bring any cash or valuables with you     - Wear comfortable clothing: an outfit that is easy to take on and off, especially around your surgical site    - Please bring photo ID and insurance card    -Call Liaison in Surgical Day Care - [phone] with any questions          DAY OF SURGERY:      Report to Surgical Day Care Unit    Surgical Day Care is located in the basement of the Hospital    Enter the Hospital using the Main entrance and once in lobby take the elevators down, in elevator push DB

## 2024-04-28 ENCOUNTER — Encounter: Payer: MEDICARE | Primary: Internal Medicine

## 2024-04-28 LAB — URINE CULTURE

## 2024-05-06 ENCOUNTER — Ambulatory Visit: Admit: 2024-05-06 | Payer: MEDICARE | Primary: Internal Medicine

## 2024-05-06 ENCOUNTER — Ambulatory Visit: Payer: MEDICARE | Primary: Internal Medicine

## 2024-05-06 DIAGNOSIS — Z01812 Encounter for preprocedural laboratory examination: Secondary | ICD-10-CM

## 2024-05-06 LAB — TYPE AND SCREEN
ABORh: O POS
Antibody Screen: NEGATIVE

## 2024-05-07 ENCOUNTER — Inpatient Hospital Stay
Admit: 2024-05-07 | Discharge: 2024-05-10 | Disposition: A | Discharge status: Home Health | Payer: MEDICARE | Source: Ambulatory Visit

## 2024-05-07 ENCOUNTER — Inpatient Hospital Stay: Admit: 2024-05-07 | Payer: MEDICARE | Primary: Internal Medicine

## 2024-05-07 DIAGNOSIS — M48062 Spinal stenosis, lumbar region with neurogenic claudication: Principal | ICD-10-CM

## 2024-05-07 LAB — CONFIRM ABO/RH: Confirm ABORh: O POS

## 2024-05-07 MED ORDER — ePHEDrine sulfate-0.9%NaCl(PF) 25 mg/5 mL (5 mg/mL) syringe  - Omnicell Override Pull
25 | INTRAVENOUS | Status: AC
Start: 2024-05-07 — End: 2024-05-07

## 2024-05-07 MED ORDER — sodium chloride 0.9 % irrigation solution
0.9 | Status: DC | PRN
Start: 2024-05-07 — End: 2024-05-07
  Administered 2024-05-07: 13:00:00 1000

## 2024-05-07 MED ORDER — metoclopramide (Reglan) tablet 10 mg
10 | Freq: Four times a day (QID) | ORAL | Status: DC | PRN
Start: 2024-05-07 — End: 2024-05-10

## 2024-05-07 MED ORDER — diazePAM (Valium) 5 mg tablet  - Omnicell Override Pull
5 | ORAL | Status: AC
Start: 2024-05-07 — End: 2024-05-07

## 2024-05-07 MED ORDER — ceFAZolin (Ancef) 1 g in sodium chloride 0.9 % 100 mL IVPB-MBP
Freq: Three times a day (TID) | INTRAVENOUS | Status: DC
Start: 2024-05-07 — End: 2024-05-09
  Administered 2024-05-07 – 2024-05-09 (×6): 1 g via INTRAVENOUS

## 2024-05-07 MED ORDER — gabapentin (Neurontin) capsule 300 mg
300 | Freq: Two times a day (BID) | ORAL | Status: DC
Start: 2024-05-07 — End: 2024-05-10
  Administered 2024-05-08 – 2024-05-10 (×6): 300 mg via ORAL

## 2024-05-07 MED ORDER — HYDROmorphone (Dilaudid) injection 0.2 mg
0.5 | INTRAMUSCULAR | Status: AC | PRN
Start: 2024-05-07 — End: 2024-05-09
  Administered 2024-05-07: 21:00:00 0.2 mg via INTRAVENOUS

## 2024-05-07 MED ORDER — fentaNYL (Sublimaze) injection 25 mcg
50 | INTRAMUSCULAR | Status: AC | PRN
Start: 2024-05-07 — End: 2024-05-07
  Administered 2024-05-07 (×4): 25 ug via INTRAVENOUS

## 2024-05-07 MED ORDER — remifentanil (Ultiva) 1 mg injection  - Omnicell Override Pull
1 | INTRAVENOUS | Status: AC
Start: 2024-05-07 — End: 2024-05-07

## 2024-05-07 MED ORDER — phenylephrine (Neo-Synephrine) injection
10 | INTRAMUSCULAR | Status: DC | PRN
Start: 2024-05-07 — End: 2024-05-07
  Administered 2024-05-07: 12:00:00 .1 via INTRAVENOUS

## 2024-05-07 MED ORDER — diphenhydrAMINE (BENADryl) capsule 25 mg
25 | Freq: Four times a day (QID) | ORAL | Status: DC | PRN
Start: 2024-05-07 — End: 2024-05-10

## 2024-05-07 MED ORDER — celecoxib (CeleBREX) 200 mg capsule  - Omnicell Override Pull
200 | ORAL | Status: AC
Start: 2024-05-07 — End: 2024-05-07

## 2024-05-07 MED ORDER — magnesium hydroxide (Milk of Magnesia) 400 mg/5 mL suspension 30 mL
400 | Freq: Two times a day (BID) | ORAL | Status: DC | PRN
Start: 2024-05-07 — End: 2024-05-10

## 2024-05-07 MED ORDER — ondansetron (Zofran) injection
4 | INTRAMUSCULAR | Status: DC | PRN
Start: 2024-05-07 — End: 2024-05-07
  Administered 2024-05-07: 15:00:00 4 via INTRAVENOUS

## 2024-05-07 MED ORDER — diazePAM (Valium) tablet 5 mg
5 | Freq: Once | ORAL | Status: AC
Start: 2024-05-07 — End: 2024-05-07
  Administered 2024-05-07: 16:00:00 5 mg via ORAL

## 2024-05-07 MED ORDER — acetaminophen (Tylenol) suppository 650 mg
650 | Freq: Four times a day (QID) | RECTAL | Status: DC | PRN
Start: 2024-05-07 — End: 2024-05-10

## 2024-05-07 MED ORDER — acetaminophen (Tylenol) solution 650 mg
325 | Freq: Four times a day (QID) | ORAL | Status: DC | PRN
Start: 2024-05-07 — End: 2024-05-10

## 2024-05-07 MED ORDER — acetaminophen (Tylenol) 325 mg tablet  - Omnicell Override Pull
325 | ORAL | Status: AC
Start: 2024-05-07 — End: 2024-05-07

## 2024-05-07 MED ORDER — dexAMETHasone (Decadron) injection
4 | INTRAMUSCULAR | Status: DC | PRN
Start: 2024-05-07 — End: 2024-05-07
  Administered 2024-05-07: 12:00:00 8 via INTRAVENOUS

## 2024-05-07 MED ORDER — propofol (Diprivan) injection
10 | INTRAVENOUS | Status: DC | PRN
Start: 2024-05-07 — End: 2024-05-07
  Administered 2024-05-07: 12:00:00 50 via INTRAVENOUS
  Administered 2024-05-07: 12:00:00 100 via INTRAVENOUS

## 2024-05-07 MED ORDER — ceFAZolin (Ancef) 2 gram injection  - Omnicell Override Pull
2 | INTRAMUSCULAR | Status: AC
Start: 2024-05-07 — End: 2024-05-07

## 2024-05-07 MED ORDER — HYDROmorphone (Dilaudid) injection 0.2 mg
0.5 | INTRAMUSCULAR | Status: DC | PRN
Start: 2024-05-07 — End: 2024-05-07
  Administered 2024-05-07 (×5): 0.2 mg via INTRAVENOUS

## 2024-05-07 MED ORDER — esmolol (Brevibloc) 100 mg/10 mL (10 mg/mL) injection  - Omnicell Override Pull
100 | INTRAVENOUS | Status: AC
Start: 2024-05-07 — End: 2024-05-07

## 2024-05-07 MED ORDER — gelatin absorbable (Gelfoam) 100 sponge
100 | TOPICAL | Status: DC | PRN
Start: 2024-05-07 — End: 2024-05-07
  Administered 2024-05-07: 12:00:00 1 via TOPICAL

## 2024-05-07 MED ORDER — metoclopramide (Reglan) injection 10 mg
5 | Freq: Four times a day (QID) | INTRAMUSCULAR | Status: DC | PRN
Start: 2024-05-07 — End: 2024-05-10

## 2024-05-07 MED ORDER — acetaminophen (Tylenol) tablet 975 mg
325 | Freq: Once | ORAL | Status: AC
Start: 2024-05-07 — End: 2024-05-07
  Administered 2024-05-07: 11:00:00 975 mg via ORAL

## 2024-05-07 MED ORDER — polyethylene glycol (Glycolax) packet 17 g
17 | Freq: Every day | ORAL | Status: DC
Start: 2024-05-07 — End: 2024-05-10
  Administered 2024-05-08 – 2024-05-10 (×3): 17 g via ORAL

## 2024-05-07 MED ORDER — BUPivacaine-EPINEPHrine (PF) (Marcaine w/EPI) 0.5 %-1:200,000 injection  - Omnicell Override Pull
0.5 | INTRAMUSCULAR | Status: AC
Start: 2024-05-07 — End: 2024-05-07

## 2024-05-07 MED ORDER — tranexamic acid (Cyklokapron) 1,000 mg/10 mL (100 mg/mL) injection  - Omnicell Override Pull
1000 | INTRAVENOUS | Status: AC
Start: 2024-05-07 — End: 2024-05-07

## 2024-05-07 MED ORDER — dexAMETHasone (Decadron) 4 mg/mL injection  - Omnicell Override Pull
4 | INTRAMUSCULAR | Status: AC
Start: 2024-05-07 — End: 2024-05-07

## 2024-05-07 MED ORDER — tiZANidine (Zanaflex) tablet 4 mg
4 | Freq: Three times a day (TID) | ORAL | Status: DC | PRN
Start: 2024-05-07 — End: 2024-05-07

## 2024-05-07 MED ORDER — ondansetron ODT (Zofran-ODT) disintegrating tablet 4 mg
4 | Freq: Three times a day (TID) | ORAL | Status: DC | PRN
Start: 2024-05-07 — End: 2024-05-10

## 2024-05-07 MED ORDER — rocuronium (ZeMuron) injection
10 | INTRAVENOUS | Status: DC | PRN
Start: 2024-05-07 — End: 2024-05-07
  Administered 2024-05-07 (×2): 50 via INTRAVENOUS

## 2024-05-07 MED ORDER — celecoxib (CeleBREX) capsule 400 mg
200 | Freq: Once | ORAL | Status: AC
Start: 2024-05-07 — End: 2024-05-07
  Administered 2024-05-07: 11:00:00 400 mg via ORAL

## 2024-05-07 MED ORDER — HYDROmorphone (Dilaudid) 1 mg/mL injection  - Omnicell Override Pull
1 | INTRAMUSCULAR | Status: AC
Start: 2024-05-07 — End: 2024-05-07

## 2024-05-07 MED ORDER — lidocaine PF (Xylocaine) 20 mg/mL (2 %) injection  - Omnicell Override Pull
20 | INTRAMUSCULAR | Status: AC
Start: 2024-05-07 — End: 2024-05-07

## 2024-05-07 MED ORDER — dextrose 5 % and lactated Ringer's infusion
INTRAVENOUS | Status: DC
Start: 2024-05-07 — End: 2024-05-10
  Administered 2024-05-07: 17:00:00 75 mL/h via INTRAVENOUS

## 2024-05-07 MED ORDER — HYDROmorphone (Dilaudid) injection
1 | INTRAMUSCULAR | Status: DC | PRN
Start: 2024-05-07 — End: 2024-05-07
  Administered 2024-05-07: 12:00:00 1 via INTRAVENOUS

## 2024-05-07 MED ORDER — fentaNYL (Sublimaze) 50 mcg/mL injection  - Omnicell Override Pull
50 | INTRAMUSCULAR | Status: AC
Start: 2024-05-07 — End: 2024-05-07

## 2024-05-07 MED ORDER — remifentanil (Ultiva) injection
1 | INTRAVENOUS | Status: DC | PRN
Start: 2024-05-07 — End: 2024-05-07
  Administered 2024-05-07: 12:00:00 .125 via INTRAVENOUS

## 2024-05-07 MED ORDER — ePHEDrine injection
50 | INTRAVENOUS | Status: DC | PRN
Start: 2024-05-07 — End: 2024-05-07
  Administered 2024-05-07: 12:00:00 10 via INTRAVENOUS

## 2024-05-07 MED ORDER — pravastatin (Pravachol) tablet 10 mg
20 | Freq: Every evening | ORAL | Status: DC
Start: 2024-05-07 — End: 2024-05-10
  Administered 2024-05-08 – 2024-05-10 (×3): 10 mg via ORAL

## 2024-05-07 MED ORDER — sugammadex (Bridion) injection
100 | INTRAVENOUS | Status: DC | PRN
Start: 2024-05-07 — End: 2024-05-07
  Administered 2024-05-07: 15:00:00 200 via INTRAVENOUS

## 2024-05-07 MED ORDER — sugammadex (Bridion) 100 mg/mL injection  - Omnicell Override Pull
100 | INTRAVENOUS | Status: AC
Start: 2024-05-07 — End: 2024-05-07

## 2024-05-07 MED ORDER — vancomycin (Vancocin) vial for injection
1000 | INTRAVENOUS | Status: DC | PRN
Start: 2024-05-07 — End: 2024-05-07
  Administered 2024-05-07: 15:00:00 2 via TOPICAL

## 2024-05-07 MED ORDER — lactated Ringer's infusion
INTRAVENOUS | Status: DC | PRN
Start: 2024-05-07 — End: 2024-05-07
  Administered 2024-05-07 (×2): via INTRAVENOUS

## 2024-05-07 MED ORDER — propofol (Diprivan) 10 mg/mL injection  - Omnicell Override Pull
10 | INTRAVENOUS | Status: AC
Start: 2024-05-07 — End: 2024-05-07

## 2024-05-07 MED ORDER — thrombin 5,000 unit topical solution
5000 | TOPICAL | Status: DC | PRN
Start: 2024-05-07 — End: 2024-05-07
  Administered 2024-05-07: 13:00:00 5000 via TOPICAL

## 2024-05-07 MED ORDER — ALPRAZolam (Xanax) tablet 0.5 mg
0.25 | Freq: Two times a day (BID) | ORAL | Status: DC | PRN
Start: 2024-05-07 — End: 2024-05-10

## 2024-05-07 MED ORDER — lidocaine PF (Xylocaine) 20 mg/mL (2 %) injection
20 | INTRAMUSCULAR | Status: DC | PRN
Start: 2024-05-07 — End: 2024-05-07
  Administered 2024-05-07: 12:00:00 100 via INTRAVENOUS

## 2024-05-07 MED ORDER — citalopram (CeleXA) tablet 20 mg
20 | Freq: Every day | ORAL | Status: DC
Start: 2024-05-07 — End: 2024-05-10
  Administered 2024-05-08 – 2024-05-10 (×3): 20 mg via ORAL

## 2024-05-07 MED ORDER — phenylephrine (Neo-Synephrine) 10 mg/mL injection  - Omnicell Override Pull
10 | INTRAMUSCULAR | Status: AC
Start: 2024-05-07 — End: 2024-05-07

## 2024-05-07 MED ORDER — ondansetron (Zofran) 4 mg/2 mL injection  - Omnicell Override Pull
4 | INTRAMUSCULAR | Status: AC
Start: 2024-05-07 — End: 2024-05-07

## 2024-05-07 MED ORDER — thrombin 5,000 unit topical solution  - Omnicell Override Pull
5000 | TOPICAL | Status: AC
Start: 2024-05-07 — End: 2024-05-07

## 2024-05-07 MED ORDER — lidocaine (Uro-Jet) 2 % gel  - Omnicell Override Pull
2 | Status: AC
Start: 2024-05-07 — End: 2024-05-07

## 2024-05-07 MED ORDER — ondansetron (Zofran) injection 4 mg
4 | Freq: Three times a day (TID) | INTRAMUSCULAR | Status: DC | PRN
Start: 2024-05-07 — End: 2024-05-10

## 2024-05-07 MED ORDER — oxyCODONE (Roxicodone) immediate release tablet 10 mg
5 | ORAL | Status: DC | PRN
Start: 2024-05-07 — End: 2024-05-10
  Administered 2024-05-07 – 2024-05-10 (×6): 10 mg via ORAL

## 2024-05-07 MED ORDER — bisacodyl (Dulcolax) EC tablet 10 mg
5 | Freq: Every day | ORAL | Status: DC | PRN
Start: 2024-05-07 — End: 2024-05-10
  Administered 2024-05-10: 13:00:00 10 mg via ORAL

## 2024-05-07 MED ORDER — bisacodyl (Dulcolax) suppository 10 mg
10 | Freq: Every day | RECTAL | Status: DC | PRN
Start: 2024-05-07 — End: 2024-05-10

## 2024-05-07 MED ORDER — acetaminophen (Tylenol) tablet 650 mg
325 | Freq: Four times a day (QID) | ORAL | Status: DC | PRN
Start: 2024-05-07 — End: 2024-05-10
  Administered 2024-05-07 – 2024-05-10 (×5): 650 mg via ORAL

## 2024-05-07 MED ORDER — oxyCODONE (Roxicodone) solution 5 mg
5 | ORAL | Status: DC | PRN
Start: 2024-05-07 — End: 2024-05-10

## 2024-05-07 MED ORDER — tiZANidine (Zanaflex) tablet 2 mg
4 | Freq: Three times a day (TID) | ORAL | Status: DC | PRN
Start: 2024-05-07 — End: 2024-05-10
  Administered 2024-05-08 – 2024-05-09 (×2): 2 mg via ORAL

## 2024-05-07 MED ORDER — rocuronium (ZeMuron) 10 mg/mL injection  - Omnicell Override Pull
10 | INTRAVENOUS | Status: AC
Start: 2024-05-07 — End: 2024-05-07

## 2024-05-07 MED ORDER — fentaNYL (Sublimaze) injection
50 | INTRAMUSCULAR | Status: DC | PRN
Start: 2024-05-07 — End: 2024-05-07
  Administered 2024-05-07: 12:00:00 150 via INTRAVENOUS
  Administered 2024-05-07: 12:00:00 50 via INTRAVENOUS

## 2024-05-07 MED ORDER — oxyCODONE (Roxicodone) solution 10 mg
5 | ORAL | Status: DC | PRN
Start: 2024-05-07 — End: 2024-05-10

## 2024-05-07 MED ORDER — esmolol (Brevibloc) 100 mg/10 mL (10 mg/mL) injection
100 | INTRAVENOUS | Status: DC | PRN
Start: 2024-05-07 — End: 2024-05-07
  Administered 2024-05-07: 15:00:00 30 via INTRAVENOUS

## 2024-05-07 MED ORDER — vancomycin (Vancocin) 1,000 mg vial for injection  - Omnicell Override Pull
1000 | INTRAVENOUS | Status: AC
Start: 2024-05-07 — End: 2024-05-07

## 2024-05-07 MED ORDER — albuterol 2.5 mg /3 mL (0.083 %) nebulizer solution 1.25 mg
2.5 | RESPIRATORY_TRACT | Status: DC | PRN
Start: 2024-05-07 — End: 2024-05-10
  Administered 2024-05-08: 14:00:00 1.25 mg via RESPIRATORY_TRACT

## 2024-05-07 MED ORDER — amLODIPine (Norvasc) tablet 5 mg
5 | Freq: Every day | ORAL | Status: DC
Start: 2024-05-07 — End: 2024-05-10
  Administered 2024-05-08 – 2024-05-10 (×3): 5 mg via ORAL

## 2024-05-07 MED ORDER — ceFAZolin (Ancef) injection
1 | INTRAMUSCULAR | Status: DC | PRN
Start: 2024-05-07 — End: 2024-05-07
  Administered 2024-05-07: 12:00:00 2 via INTRAVENOUS

## 2024-05-07 MED ORDER — oxyCODONE (Roxicodone) immediate release tablet 5 mg
5 | ORAL | Status: DC | PRN
Start: 2024-05-07 — End: 2024-05-10
  Administered 2024-05-08 – 2024-05-10 (×3): 5 mg via ORAL

## 2024-05-07 MED ORDER — ondansetron (Zofran) injection 4 mg
4 | INTRAMUSCULAR | Status: DC | PRN
Start: 2024-05-07 — End: 2024-05-07

## 2024-05-07 MED ORDER — lisinopril tablet 10 mg
5 | Freq: Every day | ORAL | Status: DC
Start: 2024-05-07 — End: 2024-05-10
  Administered 2024-05-08 – 2024-05-10 (×3): 10 mg via ORAL

## 2024-05-07 MED ORDER — tranexamic acid (Cyklokapron) injection
1000 | INTRAVENOUS | Status: DC | PRN
Start: 2024-05-07 — End: 2024-05-07
  Administered 2024-05-07: 12:00:00 1000 via INTRAVENOUS

## 2024-05-07 MED ORDER — docusate sodium (Colace) capsule 100 mg
100 | Freq: Two times a day (BID) | ORAL | Status: DC
Start: 2024-05-07 — End: 2024-05-10
  Administered 2024-05-08 – 2024-05-10 (×6): 100 mg via ORAL

## 2024-05-07 MED ORDER — BUPivacaine-EPINEPHrine (PF) (Marcaine w/EPI) 0.5 %-1:200,000 injection
0.5 | INTRAMUSCULAR | Status: DC | PRN
Start: 2024-05-07 — End: 2024-05-07
  Administered 2024-05-07: 15:00:00 30

## 2024-05-07 MED FILL — VANCOMYCIN 1,000 MG INTRAVENOUS INJECTION: 1000 1,000 mg | INTRAVENOUS | Qty: 2 | Fill #0

## 2024-05-07 MED FILL — DEXAMETHASONE SODIUM PHOSPHATE 4 MG/ML INJECTION SOLUTION: 4 4 mg/mL | INTRAMUSCULAR | Qty: 1 | Fill #0

## 2024-05-07 MED FILL — CITALOPRAM 20 MG TABLET: 20 20 mg | ORAL | Qty: 1 | Fill #0

## 2024-05-07 MED FILL — BUPIVACAINE-EPINEPHRINE (PF) 0.5 %-1:200,000 INJECTION SOLUTION: 0.5 0.5 %-1:200,000 | INTRAMUSCULAR | Qty: 30 | Fill #0

## 2024-05-07 MED FILL — HYDROMORPHONE 1 MG/ML INJECTION WRAPPER: 1 1 mg/mL | INTRAMUSCULAR | Qty: 1 | Fill #0

## 2024-05-07 MED FILL — MAGNESIUM HYDROXIDE 400 MG/5 ML ORAL SUSPENSION: 400 400 mg/5 mL | ORAL | Qty: 30 | Fill #0

## 2024-05-07 MED FILL — THROMBIN (BOVINE) 5,000 UNIT TOPICAL SOLUTION: 5000 5,000 unit | TOPICAL | Qty: 1 | Fill #0

## 2024-05-07 MED FILL — BISACODYL 5 MG TABLET,DELAYED RELEASE: 5 5 mg | ORAL | Qty: 2 | Fill #0

## 2024-05-07 MED FILL — ONDANSETRON 4 MG DISINTEGRATING TABLET: 4 4 mg | ORAL | Qty: 1 | Fill #0

## 2024-05-07 MED FILL — ACETAMINOPHEN 325 MG TABLET: 325 325 mg | ORAL | Qty: 3 | Fill #0

## 2024-05-07 MED FILL — DOCUSATE SODIUM 100 MG CAPSULE: 100 100 mg | ORAL | Qty: 1 | Fill #0

## 2024-05-07 MED FILL — REMIFENTANIL 1 MG INTRAVENOUS SOLUTION: 1 1 mg | INTRAVENOUS | Qty: 1000 | Fill #0

## 2024-05-07 MED FILL — SUGAMMADEX 100 MG/ML INTRAVENOUS SOLUTION: 100 100 mg/mL | INTRAVENOUS | Qty: 2 | Fill #0

## 2024-05-07 MED FILL — CEFAZOLIN IVPB 1 G IN 100 ML NS (MINI-BAG PLUS): 1.0000 1.0000 g | Qty: 1000 | Fill #0

## 2024-05-07 MED FILL — HYDROMORPHONE 0.5 MG/0.5 ML INJECTION WRAPPER: 0.5 0.5 mg/0.5 mL | INTRAMUSCULAR | Qty: 0.5 | Fill #0

## 2024-05-07 MED FILL — OXYCODONE 5 MG TABLET: 5 5 mg | ORAL | Qty: 2 | Fill #0

## 2024-05-07 MED FILL — FENTANYL (PF) 50 MCG/ML INJECTION SOLUTION: 50 50 mcg/mL | INTRAMUSCULAR | Qty: 2 | Fill #0

## 2024-05-07 MED FILL — ONDANSETRON HCL (PF) 4 MG/2 ML INJECTION SOLUTION: 4 4 mg/2 mL | INTRAMUSCULAR | Qty: 2 | Fill #0

## 2024-05-07 MED FILL — TIZANIDINE 4 MG TABLET: 4 4 mg | ORAL | Qty: 1 | Fill #0

## 2024-05-07 MED FILL — AMLODIPINE 5 MG TABLET: 5 5 mg | ORAL | Qty: 1 | Fill #0

## 2024-05-07 MED FILL — TRANEXAMIC ACID 1,000 MG/10 ML (100 MG/ML) INTRAVENOUS SOLUTION: 1000 1,000 mg/10 mL (100 mg/mL) | INTRAVENOUS | Qty: 10 | Fill #0

## 2024-05-07 MED FILL — ESMOLOL 100 MG/10 ML (10 MG/ML) INTRAVENOUS SOLUTION: 100 100 mg/10 mL (10 mg/mL) | INTRAVENOUS | Qty: 10 | Fill #0

## 2024-05-07 MED FILL — PHENYLEPHRINE 10 MG/ML INJECTION SOLUTION: 10 10 mg/mL | INTRAMUSCULAR | Qty: 1 | Fill #0

## 2024-05-07 MED FILL — DIAZEPAM 5 MG TABLET: 5 5 mg | ORAL | Qty: 1 | Fill #0

## 2024-05-07 MED FILL — CELECOXIB 200 MG CAPSULE: 200 200 mg | ORAL | Qty: 2 | Fill #0

## 2024-05-07 MED FILL — CEFAZOLIN 2 GRAM SOLUTION FOR INJECTION: 2 2 gram | INTRAMUSCULAR | Qty: 2000 | Fill #0

## 2024-05-07 MED FILL — PROPOFOL 10 MG/ML INTRAVENOUS EMULSION: 10 10 mg/mL | INTRAVENOUS | Qty: 20 | Fill #0

## 2024-05-07 MED FILL — BISACODYL 10 MG RECTAL SUPPOSITORY: 10 10 mg | RECTAL | Qty: 1 | Fill #0

## 2024-05-07 MED FILL — LIDOCAINE (PF) 20 MG/ML (2 %) INJECTION WRAPPER: 20 20 mg/mL (2 %) | INTRAMUSCULAR | Qty: 5 | Fill #0

## 2024-05-07 MED FILL — METOCLOPRAMIDE 10 MG TABLET: 10 10 mg | ORAL | Qty: 1 | Fill #0

## 2024-05-07 MED FILL — POLYETHYLENE GLYCOL 3350 17 GRAM ORAL POWDER PACKET: 17 17 gram | ORAL | Qty: 1 | Fill #0

## 2024-05-07 MED FILL — PRAVASTATIN 20 MG TABLET: 20 20 mg | ORAL | Qty: 1 | Fill #0

## 2024-05-07 MED FILL — ACETAMINOPHEN 325 MG TABLET: 325 325 mg | ORAL | Qty: 2 | Fill #0

## 2024-05-07 MED FILL — ROCURONIUM 10 MG/ML INTRAVENOUS SOLUTION: 10 10 mg/mL | INTRAVENOUS | Qty: 15 | Fill #0

## 2024-05-07 MED FILL — LIDOCAINE 2 % MUCOSAL JELLY IN APPLICATOR: 2 2 % | Qty: 6 | Fill #0

## 2024-05-07 MED FILL — EPHEDRINE (PF) 25 MG/5 ML (5 MG/ML) IN NS WRAPPER: 25 25 mg/5 mL (5 mg/mL) | INTRAVENOUS | Qty: 5 | Fill #0

## 2024-05-07 NOTE — Care Plan (Signed)
 Problem: Adult Inpatient Plan of Care  Goal: Plan of Care Review  Outcome: Ongoing, Progressing  Goal: Patient-Specific Goal (Individualized)  Outcome: Ongoing, Progressing  Goal: Absence of Hospital-Acquired Illness or Injury  Outcome: Ongoing, Progressing  Goal: Optimal Comfort and Wellbeing  Outcome: Ongoing, Progressing  Goal: Readiness for Transition of Care  Outcome: Ongoing, Progressing     Problem: Mobility Impairment  Goal: Optimal Mobility  Outcome: Ongoing, Progressing   Goal Outcome Evaluation:  Plan of Care Reviewed With: patient

## 2024-05-07 NOTE — Anesthesia Pre-Procedure Evaluation (Signed)
 Patient: Molly Jensen    Procedure Information       Date/Time: 05/07/24 0730    Procedure: Fusion, Spine, Lumbar, TLIF (Spine Lumbar) - L4-5 TLIF    Location: [facility] OR 14 / [facility] Operating Room    Surgeons: Oneil DELENA Lehmann, MD            Relevant Problems   Cardio   (+) Hypertension   (+) Primary hypertension      Neuro/Psych   (+) Numbness of foot      Musculoskeletal   (+) Closed fracture of right proximal humerus   (+) Degenerative joint disease   (+) Osteoarthritis of left knee   (+) Osteoarthritis of right knee   (+) Primary osteoarthritis of left knee   (+) Primary osteoarthritis of right knee      Other   (+) Neuropathy   (+) Radiculopathy, lumbar region       Clinical information reviewed:    Allergies  Meds                Physical Exam    Airway  Mallampati: II  TM distance: >3 FB  Mouth opening: >3 FB  Neck ROM: full     Cardiovascular     Rhythm: regular  Rate: normal  Functional capacity:  greater than or equal to 4 METS without symptoms   Dental - normal exam     Pulmonary - normal exam   Abdominal - normal exam   General                  Anesthesia Plan    ASA 3   NPO status verified    general     (Recent negative stress test)Airway: ETT  Monitoring: standard monitors  Postoperative Pain Control: IV/PO analgesics    Multimodal PONV prophylaxis planned  Essential imaging and labs available and reviewed    Anesthetic plan and risks discussed with patient.  Preprocedure Evaluation: No items outstanding.    Complex patient: No

## 2024-05-07 NOTE — Op Note (Signed)
 Operative Report   Patient Name:     Molly Jensen, Molly Jensen      Date of Service:     May 07, 2024        Patient ID:     [id number]     Date of Birth:     01/05/1951        Clinician:     Oneil Lehmann, MD              DATE OF SURGERY: 05/07/2024     PREOPERATIVE DIAGNOSES:  1. 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  2. 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  3. 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  4. Lumbar instability.  5. Low back pain and lumbar spondylosis.     POSTOPERATIVE DIAGNOSES:  1. 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  2. 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  3. 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  4. Lumbar instability.  5. Low back pain and lumbar spondylosis.     PROCEDURES:  1. L4-5 left-sided transforaminal lumbar interbody fusion, complete diskectomy, anterior interbody fusion by a left transforaminal approach (CPT [phone]).  2. L4-5 insertion of a 10.5 mm max height, Aprevo custom-made 3D printed titanium cage packed with iliac crest, local bone OsteoAMP extender under fluoroscopic guidance to the L4-5 disk space from the left side (CPT [phone]).  3. L4-5 decompressive laminectomy with midline and bilateral lateral recess decompression, bilateral foraminotomies with nerve root decompression above and beyond required for a left transforaminal approach (CPT [id number]).  4. L4-5 posterolateral fusion with iliac crest, local bone and osteophyte bone graft extender. This was done at the same level as interbody. Therefore, it was not coded, but it was done nonetheless.  5. L4-5 posterior instrumentation with Depuy Acromed Expedium 6.0 x 45 mm polyaxial screw and rod construct under fluoroscopic guidance (CPT 22840).  6. Right iliac crest marrow and cancellous grafting through separate skin and fascial incision with large bore cannula (CPT 20937).  7. A 360-degree spinal fusion from posterior only approach.      SURGEON: Oneil Lehmann, M.D.     ASSISTANT:   Medford Maffucci, P.A. Of note, P.A. N.P. cosurgeon was required due to the delicacy of this procedure which requires a well-trained assistant to retract the cauda equina and nerve roots and my assistant was present throughout the entirety of the case.  se.     ANESTHESIA: General endotracheal intubation.     ESTIMATED BLOOD LOSS: 200 mL.     FLUIDS: 1 gram of TXA, 2 grams of Ancef , and 1 liter of crystalloid.     SPECIMENS: Disk was discarded.     DRAINS: One medium Hemovac.     COMPLICATIONS: None.     INDICATIONS: The patient with chronic back and bilateral buttock and leg pain and neurogenic claudication. We failed all conservative measures, brought to the Operating Room for the above procedure. Of note, the patient had previous decompression at L5-S1 done elsewhere. A long midline incision at that level did not really show stenosis. Therefore, it was not incorporated into the fusion.     DESCRIPTION OF PROCEDURE: The patient was brought to the Operating Room. After induction of general anesthesia, the patient was given 2 grams of Kefzol  and 1 gram of TXA and was placed prone on the OSI frame. Had her back was prepped and draped in the usual sterile fashion. C-arm was brought in. The skin was marked out from L3 to  the sacrum. A 10-blade was used to incise the skin and subcutaneous tissues, incorporating the previous incision. She had a lot of scar at the previous incision site. The fascia was incised in line with the skin incision. Meticulous dissection was performed with Cobb elevator and electrocautery exposing the transverse processes of L3, L4, and L5. The facet joints were taken down. There was obvious cystic formation of the facets and gross instability. The C-arm was brought in and 6.0 x 45 mm screws were placed.  Each screw was started with the Anspach, sounded, tapped, and had very good bony purchase and confirmed in both AP and lateral fluoroscopy. Attention was  turned to the right iliac crest. An 11-blade was used to incise the skin and fascia of the right PSIS. A large bore cannula was passed between tables of the crest, harvesting marrow and cancellous bone, after which attention was turned back to the midline.     The interspinous ligaments were taken down sharply with a 15-blade. The majority of the L4 spinous process was removed. I did leave the leading edge of the L4 lamina but removed the whole spinous process. Anspach was then used, thinning out the bone, doing partial facetectomies on both sides. Working from the left side, the midline and the right side were decompressed, and this is above and beyond required for a transforaminal fusion. I then had to decompress the right L5 nerve root where she was quite stenotic and then performed a foraminotomy working cephalad. A Woodson could be passed out the foramen along the course of the L4 nerve and along the lateral recess along the course of the L5 nerve. I switched to the patient's right side. In a similar fashion, the left side was decompressed, freeing up the L5 nerve root and performing a foraminotomy. Valsalva showed no evidence of incidental durotomy.     At this point, attention was turned to the interbody fusion.      A complete facetectomy was performed on the left side with a rongeur and the Anspach. Large leashes of vessels were taken down with a bipolar. An annulotomy was made with a 15-blade. Disk space spreaders were placed into the disk space, distracting the disk space itself. This was locked in place with the screw and rod construct posteriorly. A complete diskectomy was performed with a combination of straight and angled regular and ring curettes down to bleeding bony endplates. It was trialed all the way up to 10 mm. The templated Aprevo cage, which was a 10.5 max height but that was in the middle of the cage, was selected. The cage was packed with the iliac crest with local bone and OsteoAMP bone  graft extender. The cage was then impacted across the midline, protecting the traversing L5 nerve root. Compression was applied posteriorly, locking the cage in place, and it was rock solid and anatomic.     At this point, a posterolateral fusion was then performed using the bone graft composite, packing it into the posterolateral gutters at L4-5 and into the facet joint on the right side at L4-5 as well. A vancomycin  powder was over the instrumentation. A medium Hemovac drain was placed, and the fascia was reapproximated with 0 PDS Stratafix in a locked fashion.  Thirty mL of 0.5% Marcaine  with epinephrine  was used to infiltrate the muscle and subcutaneous tissues. 2-0 Vicryl was used in the subcutaneous tissues, followed by a running 3-0 Monocryl subcuticular skin stitch. Mastisol and Steri-Strips and DSD were applied. All  needle and sponge counts were correct at the end of the case.                                                                                   Oneil Lehmann, MD           Date Dictated:     05/07/2024 11:27:24        Date Transcribed:     05/07/2024 14:01:00        ML/tam   Job #: [id number]

## 2024-05-07 NOTE — H&P (Unsigned)
 Preoperative History and Physical   Patient Name:     Molly Jensen, Molly Jensen      Date of Service:     May 07, 2024        Patient ID:     [id number]     Date of Birth:     01/19/1951        Clinician:     Oneil Lehmann, MD           HISTORY OF PRESENT ILLNESS:  Dorcus is a 73 year old female with a history of depression, hypertension, question RA with chronic back and leg complaints who has a history of previous surgery at L5-S1 20 years ago performed elsewhere.  She does have cervical stenosis at C4-5, but also has severe lumbar stenosis and spondylolisthesis at the L4-5 level.  She has failed conservative measures and is now scheduled for an L4-5 decompression and transforaminal interbody fusion.     REVIEW OF SYSTEMS:  Noncontributory.     PHYSICAL EXAMINATION:  NEUROLOGIC:  She has no motor deficits of the lower extremities.  Diminished reflexes.  CHEST:  Clear to auscultation.  HEART:  Regular rate and rhythm.  ABDOMEN:  Soft and nontender.     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     PLAN:  The patient will undergo an L4-5 decompression and transforaminal interbody fusion.  Risks including but not limited to infection, pseudoarthrosis, lack of fusion and persistent pain all have been discussed and this will be performed at [facility], [place]           Date Dictated:     05/06/2024 17:23:07        Date Transcribed:     05/06/2024 17:49:00        ML/lmb   Job #: [id number]

## 2024-05-07 NOTE — Nursing Note (Addendum)
 Pt transferred from PACU to M3 at 1347. Pt is a/ox 4, little drowsy. Sister present at bedside. Dressing to the back is C/D/I. Pt on 2 L of O2 via NC. Hemovac is draining. Foley is patent and draining. Pt complained 7/10 pain PRN oxycodone  utilized with mod effect. Ice applied. SCD on. Got pt OOB to recliner. Pt tolerated well. No dizziness or lightheadedness noted. Pt tolerated ambulation well. Pt sitting on a recliner. Call light within reach.

## 2024-05-07 NOTE — Progress Notes (Signed)
 05/07/24 1511   Case Management Initial Assessment   Source of Information Patient;Family   Type of Residence Apartment   Lives With Alone   Discharge Services Anticipated at this Time Yes   Expected Discharge Needs SNF;Acute Rehab   Patient Information   Support Systems Children/Family   Need PCP on Discharge No   Current Patient Responsibilities Personal Care;Housekeeping;Shopping;Meal Prep;Driving;Laundry   PTA Functional Status   Stairs to [employer] 0   Current Home Treatment/Devices/Equipment Centre;Shower Chair   Is patient prescribed an anti-coagulant? No   Current Functional Status   Bathing Independent   Toileting Independent   Walking in Home Independent   Assistive Device Used   (cane)   Geophysical data processor Confirmed with Patient Yes   Legal Information   Does Patient Have HCP Yes   Discharge Planning   Patient/Family/Caregiver Discharge Preference Acute Rehab   Anticipated Discharge Disposition SNF   Next Actions   Assessment/High Risk Screening Complete     Case Management Progress Note:   Chief complaint/Reason for visit: 51yr female 9/18 s/p L4-5 decompression and transforaminal interbody fusion. DX severe lumbar stenosis and spondylolisthesis.    Admit Diagnosis:  Chief complaint/Reason for visit:  PMH:  depression, hypertension, question RA with chronic back and leg complaints, prior back surgies  Plan of care:  Has Hemovac drain and Foley catheter Post-op. Will need to work with PT/OT when appropriate. Met with Pt & Sister Bedside, instructed in my role and verified contact info. She would like referral to [facility].    Social History/Living situation:  Lives alone SL apartment, no stairs  Functional History:  IPTA, drives  Previous/Active Services:  HX w/[facility] and [facility]  HCP on file:  yes  PCP on file:  yes  Discharge Transportation:  TBD  Discharge Barriers: lives alone  Discharge Plan/Needs:  anticipate rehab, referrals  placed  CM to follow  Rosaline Duran, RN  05/07/2024  3:19 PM

## 2024-05-07 NOTE — Anesthesia Procedure Notes (Signed)
 Airway    Date/Time: 05/07/2024 7:37 AM    Location:  OR  Urgency:  Elective  Difficult Airway: No    Induction Technique:  Intravenous  RSI: No    Cricoid pressure: No    ETT In Situ?: No    Anesthesiologist:  Toribio Bobo, MD  Resident/CRNA:  Alm Gladis Amabile, CRNA  Performed by:  Resident/CRNA   Toribio Bobo, MD  Indications for Airway Management:  Anesthesia  Preoxygenated: Yes    Patient Position:  Sniffing  Mask Ventilation:  Easy mask  Adjuncts:  Oral Airway  Final Airway Type:  Endotracheal airway  Final Endotracheal Airway:  ETT  Cuffed: Yes    Stylet: Yes    Technique Used for Successful ETT Placement:  Video laryngoscopy  Insertion Site:  Oral  Blade Type:  McGrath  Laryngoscope Blade/Video laryngoscope Blade Size:  3  ETT Size (mm):  7.0   Toribio Bobo, MD  Measured from:  Lips  ETT Depth (cm):  21  Placement Verified by: auscultation and capnometry    Cormack-Lehane Classification:  Grade I - full view of glottis  Number of Attempts at Approach:  1  Ventilation Between Attempts:  BVM

## 2024-05-08 LAB — CBC WITH DIFFERENTIAL
Basophils %: 0.1 %
Basophils Absolute: 0.01 K/uL (ref 0.00–0.22)
Eosinophils %: 0 %
Eosinophils Absolute: 0 K/uL (ref 0.00–0.50)
Hematocrit: 35 % (ref 32.0–47.0)
Hemoglobin: 11.7 g/dL (ref 11.0–16.0)
Immature Granulocytes %: 0.8 %
Immature Granulocytes Absolute: 0.09 K/uL (ref 0.00–0.10)
Lymphocyte %: 9.1 %
Lymphocytes Absolute: 1.05 K/uL (ref 0.70–4.00)
MCH: 30.1 pg (ref 26.0–34.0)
MCHC: 33.4 g/dL (ref 31.0–37.0)
MCV: 90 fL (ref 80.0–100.0)
MPV: 8.8 fL — ABNORMAL LOW (ref 9.1–12.4)
Monocytes %: 5.5 %
Monocytes Absolute: 0.64 K/uL (ref 0.36–0.77)
NRBC %: 0 % (ref 0.0–0.0)
NRBC Absolute: 0 K/uL (ref 0.00–2.00)
Neutrophil %: 84.5 %
Neutrophils Absolute: 9.78 K/uL — ABNORMAL HIGH (ref 1.50–7.95)
Platelets: 279 K/uL (ref 150–400)
RBC: 3.89 M/uL (ref 3.70–5.20)
RDW-CV: 15.1 % — ABNORMAL HIGH (ref 11.5–14.5)
RDW-SD: 50 fL (ref 35.0–51.0)
WBC: 11.6 K/uL — ABNORMAL HIGH (ref 4.0–11.0)

## 2024-05-08 LAB — BASIC METABOLIC PANEL
Anion Gap: 4 mmol/L (ref 3–14)
BUN: 12 mg/dL (ref 6–24)
CO2 (Bicarbonate): 26 mmol/L (ref 20–32)
Calcium: 8.6 mg/dL (ref 8.5–10.5)
Chloride: 109 mmol/L (ref 98–110)
Creatinine: 0.95 mg/dL (ref 0.55–1.30)
Glucose: 127 mg/dL — ABNORMAL HIGH (ref 70–110)
Potassium: 4 mmol/L (ref 3.6–5.2)
Sodium: 139 mmol/L (ref 135–146)
eGFRcr: 64 mL/min/1.73m*2 (ref >=60)

## 2024-05-08 MED FILL — POLYETHYLENE GLYCOL 3350 17 GRAM ORAL POWDER PACKET: 17 17 gram | ORAL | Qty: 1 | Fill #0

## 2024-05-08 MED FILL — TIZANIDINE 4 MG TABLET: 4 4 mg | ORAL | Qty: 1 | Fill #0

## 2024-05-08 MED FILL — CEFAZOLIN IVPB 1 G IN 100 ML NS (MINI-BAG PLUS): 1.0000 1.0000 g | Qty: 1000 | Fill #0

## 2024-05-08 MED FILL — ALBUTEROL SULFATE 2.5 MG/3 ML (0.083 %) SOLUTION FOR NEBULIZATION: 2.5 2.5 mg /3 mL (0.083 %) | RESPIRATORY_TRACT | Qty: 3 | Fill #0

## 2024-05-08 MED FILL — AMLODIPINE 5 MG TABLET: 5 5 mg | ORAL | Qty: 1 | Fill #0

## 2024-05-08 MED FILL — CITALOPRAM 20 MG TABLET: 20 20 mg | ORAL | Qty: 1 | Fill #0

## 2024-05-08 MED FILL — ACETAMINOPHEN 325 MG TABLET: 325 325 mg | ORAL | Qty: 2 | Fill #0

## 2024-05-08 MED FILL — OXYCODONE 5 MG TABLET: 5 5 mg | ORAL | Qty: 2 | Fill #0

## 2024-05-08 MED FILL — OXYCODONE 5 MG/5 ML ORAL SOLUTION: 5 5 mg/5 mL | ORAL | Qty: 10 | Fill #0

## 2024-05-08 MED FILL — GABAPENTIN 300 MG CAPSULE: 300 300 mg | ORAL | Qty: 1 | Fill #0

## 2024-05-08 MED FILL — OXYCODONE 5 MG TABLET: 5 5 mg | ORAL | Qty: 1 | Fill #0

## 2024-05-08 MED FILL — DOCUSATE SODIUM 100 MG CAPSULE: 100 100 mg | ORAL | Qty: 1 | Fill #0

## 2024-05-08 MED FILL — PRAVASTATIN 20 MG TABLET: 20 20 mg | ORAL | Qty: 1 | Fill #0

## 2024-05-08 MED FILL — LISINOPRIL 5 MG TABLET: 5 5 mg | ORAL | Qty: 2 | Fill #0

## 2024-05-08 NOTE — Anesthesia Post-Procedure Evaluation (Signed)
 Patient: Molly Jensen    Procedure Summary       Date: 05/07/24 Room / Location: [facility] OR 14 / [facility] Operating Room    Anesthesia Start: 0723 Anesthesia Stop: 1131    Procedure: Fusion, Spine, Lumbar, TLIF (Spine Lumbar) Diagnosis:       Spinal stenosis, lumbar region with neurogenic claudication      Radiculopathy, lumbar region      (Spinal stenosis, lumbar region with neurogenic claudication [M48.062])      (Radiculopathy, lumbar region [M54.16])    Surgeons: Oneil DELENA Lehmann, MD Responsible Provider: Toribio Bobo, MD    Anesthesia Type: general ASA Status: 3            Anesthesia Type: general    Vitals Value Taken Time   BP 107/66 05/07/24 13:32   Temp 36.6 C (97.8 F) 05/07/24 13:24   Pulse 94 05/07/24 13:36   Resp 25 05/07/24 13:36   SpO2 97 % 05/07/24 13:36   Vitals shown include unfiled device data.    Anesthesia Post Evaluation Note:    Patient location during evaluation: PACU  Patient participation: able to participate  Level of consciousness: arousable  Cardiovascular and Hydration status: stable  Respiratory Status Stable and Airway Patent: yes  Nausea and Vomiting Control Satisfactory: yes  Pain management: adequate     Vitals reviewed: yes  Unplanned ICU Admission: no      There were no known notable events for this encounter.

## 2024-05-08 NOTE — Progress Notes (Cosign Needed)
 Orthopedic Spine Progress Note    Subjective     Post-Operative Day: 1  Fusion, Spine, Lumbar, TLIF  [id number] - PR ARTHRODESIS COMBINED TQ 1NTRSPC LUMBAR     Systemic or Specific Complaints: C/O incisional/LBP. Preoperative radicular leg complaints improved. Pt reports somewhat concerned about going to rehab upon d/c, would prefer to go home if possible.     Objective     Vital signs in last 24 hours:  Temp:  [36.2 C (97.2 F)-36.6 C (97.8 F)] 36.4 C (97.6 F)  Pulse:  [71-98] 71  Resp:  [9-20] 18  SpO2:  [91 %-100 %] 95 %  BP: (106-139)/(66-85) 113/70    General: alert and oriented, in no acute distress   Neurovascular: Neurologically intact to LE   Wound: wound clean and dry no evidence of infection. Hemovac intact.    GI No flatus. No BM   DVT Exam: Negative Homan's sign.     Data Review  CBC:    Lab Results   Component Value Date    GLUCOSE 131 (H) 04/22/2024    CALCIUM 9.5 04/22/2024    NA 139 04/27/2024    K 4.2 04/27/2024    CO2 26 04/27/2024    CL 107 04/27/2024    BUN 9 04/22/2024    CREATININE 1.17 04/22/2024        Assessment/Plan     Status post-spine procedure: Complications: None    Pain Relief: Adequate    Continues current post-op course  Continue PT/pain control  Encourage IS/OOB ambulating  D/C foley  Plan for D/C home with services vs. Rehab on Sunday    Activity: out of bed and ambulate    Weight Bearing: WBAT     LOS: 1 day     Lonni Cassette, [place]    Date: 05/08/2024  Time: 7:17 AM

## 2024-05-08 NOTE — Consults (Signed)
 Physical Therapy Evaluation    Patient Name: Molly Jensen  MRN: [id number]  DOB: 1951/01/24  Evaluation Date: 05/08/2024    Subjective   HPI/Hospital Course:   Pt is a(n) 73 yo Female with PMH of HTN, DJD, OA, neuropathy, radiculopathy presenting to hospital for s/p L4-5 spinal fusion., TLIF 9/18 w/ Dr. Alpheus.  PT consulted for mobility assessment and dispo planning.      Problem List[1]    Medical History[2]    Surgical History[3]    Objective     General Information  General Info  PT Received On: 05/08/24  Following Therapy Session:: Call bell within reach, Patient in Chair, Nursing Staff Aware of Patient Location, Family/visitor Present  Plan of Care Reviewed With:: Patient, Case Manager, RN/Charge Nurse, OT  Recommended mobility with nursing staff: 1A w/ RW  Eval Information  Type of Evaluation: Initial Evaluation    Precautions  Weight Bearing Status-1: Weight Bearing as Tolerated  Other Precautions: Fall Risk, Spine-No bending, Lifting, Twisting    Home Living  Was Patient Admitted from STR?: No  Lives With: Alone  Type of Home: Apartment  Home Layout: One Level  Number of Stairs to Enter Home: 0  Home Equipment: Single [place]  ADL/IADL Equipment: Paediatric nurse, Engineer, materials in Air traffic controller  Comments:  (per pt able able to use elevator to enter, one-level living w/ assist from family. multiple canes in home used PRN)    Prior Level of Function  Information Provided By: Patient, EMR  Dominance: Right  Independent at Baseline: All Household Mobility, All Community Mobility, All ADL's/IADL's  Current Home Services:  (pt states she will be able to have 24/7 care from sister x 2 weeks and daughter x 1)    Pain  Pain Assessment: 0-10  Pain Score:  (pt endroses that her chest pain from eralier in the day has subsided fully following administration of inhaler . Pt states she has some stiffness in her back but endorses no pain currently w/ tylenol )  Pain Type: Surgical pain  Pain Location: Back  Pain Management  Interventions: care clustered, position adjusted, pillow support provided       Vital Signs  Vital Signs  Pulse: 77  Heart Rate Source: Monitor  BP: 123/73  BP Location: Right arm  BP Method: Automatic  Patient Position: Semi-Fowler  Oxygen Therapy  SpO2: 95 %  Pulse Oximetry Type: Intermittent  Patient Activity During SpO2 Measurement: At rest  Oxygen Therapy: None (Room air)    Cognition  Overall Cognitive Status: Within Functional Limits  Orientation Level: Oriented X4    Integumentary  Integumentary: PIV, R low back suction drain, visible skin intact, wound dressing clean & intact  LDA Integrity: Intact Pre and Post Therapy    ROM/Strength  PROM Right Upper Extremity  Overall RUE PROM: WFL  PROM Left Upper Extremity  Overall LUE PROM: WFL  PROM Right Lower Extremity  Overall RLE PROM: WFL  PROM Left Lower Extremity  Overall LLE PROM: WFL  Strength Right Upper Extremity  RUE Overall Strength: WFL  Strength Left Upper Extremity  LUE Overall Strength: WFL  Strength Right Lower Extremity  RLE Overall Strength: Other (Comment)  Strength Left Lower Extremity  LLE Overall Strength: Other (Comment)    Neuromuscular Assessment  Sensation: WFL (intact to light touch)         Mobility Assessment  Roll Right: Supervision  Supine to Sit: Supervision  Scooting: Supervision  Bed Mobility Comments: pt req min VC for log roll  technique to properly perform bed mob    Sit to Stand: Supervision  Stand to Sit: Supervision  Assistive Device: Rolling Walker  Transfers Comments: pt demo ability to perform txfers w/ cueing for hand placement and proper us  eof RW. Pt demo good standing balance w/ no reports of dizziness or lightheadedness.    Distance (Feet): 100  Assistance Level: Supervision, Minimal Assist (pt progress to S w/ amb in hallway and useof RW)  Assistive Device: Rolling Walker  Gait Characteristics: pt demo decreased cadence w/ good stride length and no reports of increased pain. Pt cadence improves w/ increased distance  and reports of loosening up              [place]  [employer] Balance Scale: Yes  KU Sitting Balance Scale: 3+ - Independently sits without upper extremity support for 30 seconds or greater.  KU Standing Balance Scale: 2 - Independently supports self with both upper extremities.    AMPAC - (Basic Mobility Inpatient Short Form) How much help from another person do you currently need?  Turning from your back to your side while in a flat bed without using bedrails?: 4 - None  Moving from lying on your back to sitting on the side of a flat bed without using bedrails?: 4 - None  Moving to and from a bed to a chair (including a wheelchair)?: 4 - None  Standing up from a chair using your arms (e.g., wheelchair, or bedside chair)?: 4 - None  To walk in hospital room?: 3 - A little  Climbing 3-5 steps with a railing?: 2 - A Lot  Raw Score: 21         Education  Education Provided: Role of OT/Plan of Care, Role of PT/Plan of Care, Discharge Planning, Activities of Daily Living, Adaptive Equipment/Assistive Device, Development worker, community, Safety Awareness/Fall Risk, Precautions, Estate manager/land agent  Education Provided to: Patient  Teaching Method: Demonstration, Discussion, Handouts  Barriers to learning: None evident  Learning Evaluation: Verbalizes understanding, Demonstrates/applies knowledge, Needs reinforcement/further teaching, Needs practice    Assessment   Pt is a(n) 73 yo Female with PMH as above who presents to physical therapy for s/p L4-5 spinal fusion., TLIF 9/18 w/ Dr. Alpheus. Pt is functioning below baseline functional status and is limited by below impairments iso pain, fatigue and weakness 2/2 surgical intervention. Pt scored 21/24 on the Mobility AMPAC scale, indicating a 29.52% functional disability and score consistent with d/c location of home. Pt will benefit from d/c to HOME with PT and support from family pending medical clearance to maximize LOF and further address skilled multidisciplinary needs. Pt will benefit  from use of RW upon d/c. Acute PT will cont to follow while here.      Patient presents with Impaired gait, Functional Mobility deficit, Impaired activity tolerance, Pain, Balance deficit, Strength deficit, Range of motion deficit       Prognosis: Good       Goals   Short Term Goals  Date Established/Amended: 05/08/24  Goal timeframe: 2 weeks  Bed mobility goal: pt will be I w/ all bed mobility w/ adherence to spine precautions  Transfers goal: pt will perform all transfers mod I w/ LRAD  Ambulation goal: pt will amb <300 ft mod I w/ LRAD         Plan   Plan: Continued Skilled Inpatient PT Services   Treatment Interventions: Therapeutic exercise, Assistive device training, Functional mobility training, Gait training, Stair training, Patient/family education, Neuromuscular Re-education/balance  PT Frequency and  Duration: 5-7 x/week until Discharge  Treatment:      Recommendations  Anticipate Discharge to: Home with support/services  Discharge Assist/Support Needed: Intermittent Supervision, Physical assistance from family/caregiver  Home services recommended: Home PT  Discharge Equipment Anticipated: Rolling Walker  Recommended mobility with nursing staff: 1A w/ RW  Lavanda Course, PT  [place] lic # [id number]       [8]   Patient Active Problem List  Diagnosis    Primary osteoarthritis of right knee    Primary osteoarthritis of left knee    Influenza B    Syncope and collapse    Syncope, unspecified syncope type    Multiple falls    Fall, initial encounter    Hypertension     Degenerative joint disease    Metatarsalgia    Nuclear sclerotic cataract of both eyes    Numbness of foot    Posterior vitreous detachment of both eyes    Refractive error    Spinal stenosis of lumbar region without neurogenic claudication    Vitreous hemorrhage of right eye (CMS-HCC)    Primary hypertension     Influenza due to influenza virus, type B    Recurrent falls    Osteoarthritis of left knee    Osteoarthritis of right knee    Syncope    Cervical  stenosis of spinal canal    Prediabetes    Neuropathy    Closed fracture of right proximal humerus    Spinal stenosis, lumbar region with neurogenic claudication    Radiculopathy, lumbar region   [2]   Past Medical History:  Diagnosis Date    Asthma (Multi-HCC)     Cervical stenosis of spinal canal     States, numbness in left thumb    Closed fracture of right proximal humerus     Degenerative joint disease     Hyperlipidemia      Hypertension      Neuropathy     Overactive bladder     Prediabetes     Recurrent falls     Spinal stenosis, lumbar region with neurogenic claudication     States, pain/numbness/tingling/weakness causing falls    Vitreous hemorrhage of right eye (CMS-HCC)    [3]   Past Surgical History:  Procedure Laterality Date    CESAREAN SECTION, LOW TRANSVERSE      x2    CHOLECYSTECTOMY      COLECTOMY      AT AGE 33    CTA CHEST W AND WO CONTRAST  08/12/2023    CTA CHEST W AND WO CONTRAST 08/12/2023 [facility] CT    DILATION AND CURETTAGE OF UTERUS      x2    HYSTERECTOMY      PR ARTHRODESIS COMBINED TQ 1NTRSPC LUMBAR N/A 05/07/2024    Preliminary Information:  Procedure: Fusion, Spine, Lumbar, TLIF;  Surgeon: Oneil DELENA Lehmann, MD;  Location: [facility] OR;  Service: Orthopedic Surgery

## 2024-05-08 NOTE — Consults (Signed)
 Occupational Therapy Evaluation     Patient Name: Molly Jensen  MRN: [id number]  DOB: July 28, 1951  Today's Date: 05/08/2024    Subjective   HPI/Hospital Course: Pt is a 73 yo F presenting s/p L4-5 TLIF 9/18, spine precautions.    Problem List[1]    Medical History[2]    Surgical History[3]    Objective   General Information  General Info  OT Received On: 05/08/24  Following Therapy Session:: Patient in Chair, Call bell within reach, Nursing Staff Aware of Patient Location, Family/visitor Present  Plan of Care Reviewed With:: Patient, PT, RN/Charge Nurse, Family  Eval Information  Type of Evaluation: Initial Evaluation    Precautions  Precautions  Other Precautions: Fall Risk, Spine-No bending, Lifting, Twisting    Home Living  Was Patient Admitted from STR?: No  Lives With: Alone  Type of Home: Apartment  Home Layout: One Level  Number of Stairs to Enter Home: 0  Home Equipment: Single Training and development officer  ADL/IADL Equipment: Paediatric nurse, Engineer, materials in Air traffic controller, Sports administrator    Prior Level of Function  Information Provided By: Patient  Dominance: Right  Independent at Baseline: All Museum/gallery exhibitions officer, All Data processing manager, All ADL's/IADL's  Comments: Pt reports sister going to stay x2 weeks and daughter x1 week    Pain  Pain Assessment  Pain Assessment: No/denies pain    Cognition  Cognition  Overall Cognitive Status: Within Functional Limits           Integumentary  Integumentary  Integumentary: PIV, R low back hemovac, visible skin intact, wound dressing clean & intact  LDA Integrity: Intact Pre and Post Therapy    ROM/Strength  PROM Right Upper Extremity  Overall RUE PROM: WFL  PROM Left Upper Extremity  Overall LUE PROM: WFL          Functional Mobility Assessment  Roll Right: Supervision  Supine to Sit: Supervision  Scooting: Supervision  Bed Mobility Comments: Educated on log roll tech    Sit to Stand: Supervision  Stand to Sit: Supervision    Distance: Household distances  Assistance Level: Supervision  Assistive Device:  Agricultural consultant         ADL's  Lower Body Dressing: Supervision (doff/sonn socks in figure 4)  ADL Comments: Pt privided edcaution on safety with spine precautions and LB dressin, demonstrating understanding. Pt educated on safety with seated UB dressing, verbalizing understanding  AMPAC - (Daily Activity Inpatient Short Form) How much help from another person do you currently need?  Putting on and taking off regular lower body clothing?: 3 - A Little  Bathing (including washing, rinsing, drying)?: 3 - A Little  Toileting, which includes using toilet,bedpan or urinal?: 4 - None  Putting on and taking off regular upper body clothing?: 4 - None  Taking care of personal grooming such as brushing teeth?: 4 - None  Eating meals?: 4 - None  Raw Score: 22      Education  Education  Education Provided: Role of OT/Plan of Care, Discharge Planning, ADL Training, Adaptive Equipment/Assistive Device, Development worker, community, Safety Awareness/Fall Risk, Precautions, Activities Guidelines  Education Provided to: Patient  Teaching Method: Demonstration, Discussion, Handouts  Learning Evaluation: Needs practice, Needs reinforcement/further teaching, Verbalizes understanding    Assessment   Pt received in bed, agreeable to OT evaluation. Pt presenting with decreased self care in setting of TLIF. Pt supervision level for ADLs and mobility reporting improvements in pain. Pt reporting strong home supports. Anticipate with continued progress towards OT goals, pt may dc  home with family support and home services when medically stable.       Patient presents with ADL/Self care deficit, IADL deficit, Functional Mobility deficit            Goals   Short Term Goals  Date Established/Amended: 05/08/24  Goal timeframe: 1 week  Goal 1: Complete LB dressing with Mod I  Goal 2: Complete toilet transfer with Mod I  Goal 3: Complete standing grooming with Mod I      Plan   Plan: Continued skilled Inpatient OT services   Treatment Interventions: ADLs/Self  Care Training, Patient/Caregiver Education, Functional Mobility, Bed Mobility, Transfer Training, Paediatric nurse, Adaptive Equipment Training  OT Frequency and Duration: 2-3 x/week until Discharge  Treatment:      Recommendations  Anticipate Discharge to: Home with support/services  Discharge Assist/Support Needed: Intermittent Supervision  Home services recommended: Home PT    [facility] [place], OT  [place] lic # [id number]           [8]   Patient Active Problem List  Diagnosis    Primary osteoarthritis of right knee    Primary osteoarthritis of left knee    Influenza B    Syncope and collapse    Syncope, unspecified syncope type    Multiple falls    Fall, initial encounter    Hypertension     Degenerative joint disease    Metatarsalgia    Nuclear sclerotic cataract of both eyes    Numbness of foot    Posterior vitreous detachment of both eyes    Refractive error    Spinal stenosis of lumbar region without neurogenic claudication    Vitreous hemorrhage of right eye (CMS-HCC)    Primary hypertension     Influenza due to influenza virus, type B    Recurrent falls    Osteoarthritis of left knee    Osteoarthritis of right knee    Syncope    Cervical stenosis of spinal canal    Prediabetes    Neuropathy    Closed fracture of right proximal humerus    Spinal stenosis, lumbar region with neurogenic claudication    Radiculopathy, lumbar region   [2]   Past Medical History:  Diagnosis Date    Asthma (Multi-HCC)     Cervical stenosis of spinal canal     States, numbness in left thumb    Closed fracture of right proximal humerus     Degenerative joint disease     Hyperlipidemia      Hypertension      Neuropathy     Overactive bladder     Prediabetes     Recurrent falls     Spinal stenosis, lumbar region with neurogenic claudication     States, pain/numbness/tingling/weakness causing falls    Vitreous hemorrhage of right eye (CMS-HCC)    [3]   Past Surgical History:  Procedure Laterality Date    CESAREAN SECTION, LOW  TRANSVERSE      x2    CHOLECYSTECTOMY      COLECTOMY      AT AGE 79    CTA CHEST W AND WO CONTRAST  08/12/2023    CTA CHEST W AND WO CONTRAST 08/12/2023 [facility] CT    DILATION AND CURETTAGE OF UTERUS      x2    HYSTERECTOMY      PR ARTHRODESIS COMBINED TQ 1NTRSPC LUMBAR N/A 05/07/2024    Preliminary Information:  Procedure: Fusion, Spine, Lumbar, TLIF;  Surgeon: Oneil DELENA Lehmann, MD;  Location: [facility] OR;  Service: Orthopedic Surgery

## 2024-05-08 NOTE — Progress Notes (Signed)
 05/07/24 1511   Case Management Initial Assessment   Source of Information Patient;Family   Type of Residence Apartment   Lives With Alone   Discharge Services Anticipated at this Time Yes   Expected Discharge Needs SNF;Acute Rehab   Patient Information   Support Systems Children/Family   Need PCP on Discharge No   Current Patient Responsibilities Personal Care;Housekeeping;Shopping;Meal Prep;Driving;Laundry   PTA Functional Status   Stairs to [place] 0   Current Home Treatment/Devices/Equipment [place];Shower Chair   Is patient prescribed an anti-coagulant? No   Current Functional Status   Bathing Independent   Toileting Independent   Walking in Home Independent   Assistive Device Used   (cane)   Geophysical data processor Confirmed with Patient Yes   Legal Information   Does Patient Have HCP Yes   Discharge Planning   Patient/Family/Caregiver Discharge Preference Acute Rehab   Anticipated Discharge Disposition SNF   Next Actions   Assessment/High Risk Screening Complete     Case Management Progress Note:   Chief complaint/Reason for visit: 31yr female 9/18 s/p L4-5 decompression and transforaminal interbody fusion. DX severe lumbar stenosis and spondylolisthesis.    Admit Diagnosis:  Chief complaint/Reason for visit:  PMH:  depression, hypertension, question RA with chronic back and leg complaints, prior back surgies  Plan of care:  Has Hemovac drain and Foley catheter Post-op. Will need to work with PT/OT when appropriate. Met with Pt & Sister Bedside, instructed in my role and verified contact info. She would like referral to [facility] and [facility].    Social History/Living situation:  Lives alone SL apartment, no stairs  Functional History:  IPTA, drives  Previous/Active Services:  HX w/[facility] and STR  HCP on file:  yes  PCP on file:  yes  Discharge Transportation:  TBD  Discharge Barriers: lives alone  Discharge Plan/Needs:  anticipate rehab, referrals  placed  CM to follow  Rosaline Duran, RN  05/07/2024  3:19 PM    Case Management Progress Note:   Pt doing better today. Worked with PT/OT and now wants to d/c home. Sister Lorenda confirms she will have someone with her for a few days at home to ensure safe.  Will need RW  Cm to follow  Rosaline Duran, RN  05/08/2024  4:36 PM

## 2024-05-08 NOTE — Nursing Note (Signed)
 Patient oob amb to the bathroom. + Flatus + BS x 4. Diet advanced to full liquids. Hemovac intact 50ml output. IS encouraged.

## 2024-05-08 NOTE — Care Plan (Signed)
 Goal Outcome Evaluation:  Plan of Care Reviewed With: patient

## 2024-05-09 LAB — CBC WITH DIFFERENTIAL
Basophils %: 0.1 %
Basophils Absolute: 0.01 K/uL (ref 0.00–0.22)
Eosinophils %: 0 %
Eosinophils Absolute: 0 K/uL (ref 0.00–0.50)
Hematocrit: 32.5 % (ref 32.0–47.0)
Hemoglobin: 10.4 g/dL — ABNORMAL LOW (ref 11.0–16.0)
Immature Granulocytes %: 0.3 %
Immature Granulocytes Absolute: 0.02 K/uL (ref 0.00–0.10)
Lymphocyte %: 21.7 %
Lymphocytes Absolute: 1.63 K/uL (ref 0.70–4.00)
MCH: 29.7 pg (ref 26.0–34.0)
MCHC: 32 g/dL (ref 31.0–37.0)
MCV: 92.9 fL (ref 80.0–100.0)
MPV: 9 fL — ABNORMAL LOW (ref 9.1–12.4)
Monocytes %: 7.3 %
Monocytes Absolute: 0.55 K/uL (ref 0.36–0.77)
NRBC %: 0 % (ref 0.0–0.0)
NRBC Absolute: 0 K/uL (ref 0.00–2.00)
Neutrophil %: 70.6 %
Neutrophils Absolute: 5.29 K/uL (ref 1.50–7.95)
Platelets: 233 K/uL (ref 150–400)
RBC: 3.5 M/uL — ABNORMAL LOW (ref 3.70–5.20)
RDW-CV: 15.4 % — ABNORMAL HIGH (ref 11.5–14.5)
RDW-SD: 52.2 fL — ABNORMAL HIGH (ref 35.0–51.0)
WBC: 7.5 K/uL (ref 4.0–11.0)

## 2024-05-09 LAB — BASIC METABOLIC PANEL
Anion Gap: 5 mmol/L (ref 3–14)
BUN: 12 mg/dL (ref 6–24)
CO2 (Bicarbonate): 26 mmol/L (ref 20–32)
Calcium: 8.5 mg/dL (ref 8.5–10.5)
Chloride: 109 mmol/L (ref 98–110)
Creatinine: 0.94 mg/dL (ref 0.55–1.30)
Glucose: 79 mg/dL (ref 70–110)
Potassium: 3.8 mmol/L (ref 3.6–5.2)
Sodium: 140 mmol/L (ref 135–146)
eGFRcr: 64 mL/min/1.73m*2 (ref >=60)

## 2024-05-09 MED ORDER — oxyCODONE (Roxicodone) 5 mg immediate release tablet
5 | ORAL_TABLET | Freq: Four times a day (QID) | ORAL | 0 refills | 8.00000 days | Status: AC | PRN
Start: 2024-05-09 — End: 2024-05-14

## 2024-05-09 MED ORDER — tiZANidine (Zanaflex) 2 mg capsule
2 | ORAL_CAPSULE | Freq: Two times a day (BID) | ORAL | 1 refills | 30.00000 days | Status: AC | PRN
Start: 2024-05-09 — End: 2024-05-19

## 2024-05-09 MED FILL — DOCUSATE SODIUM 100 MG CAPSULE: 100 100 mg | ORAL | Qty: 1 | Fill #0

## 2024-05-09 MED FILL — GABAPENTIN 300 MG CAPSULE: 300 300 mg | ORAL | Qty: 1 | Fill #0

## 2024-05-09 MED FILL — TIZANIDINE 4 MG TABLET: 4 4 mg | ORAL | Qty: 1 | Fill #0

## 2024-05-09 MED FILL — LISINOPRIL 5 MG TABLET: 5 5 mg | ORAL | Qty: 2 | Fill #0

## 2024-05-09 MED FILL — AMLODIPINE 5 MG TABLET: 5 5 mg | ORAL | Qty: 1 | Fill #0

## 2024-05-09 MED FILL — CITALOPRAM 20 MG TABLET: 20 20 mg | ORAL | Qty: 1 | Fill #0

## 2024-05-09 MED FILL — ACETAMINOPHEN 325 MG TABLET: 325 325 mg | ORAL | Qty: 2 | Fill #0

## 2024-05-09 MED FILL — OXYCODONE 5 MG TABLET: 5 5 mg | ORAL | Qty: 2 | Fill #0

## 2024-05-09 MED FILL — OXYCODONE 5 MG TABLET: 5 5 mg | ORAL | Qty: 1 | Fill #0

## 2024-05-09 MED FILL — PRAVASTATIN 20 MG TABLET: 20 20 mg | ORAL | Qty: 1 | Fill #0

## 2024-05-09 MED FILL — CEFAZOLIN IVPB 1 G IN 100 ML NS (MINI-BAG PLUS): 1.0000 1.0000 g | Qty: 1000 | Fill #0

## 2024-05-09 MED FILL — POLYETHYLENE GLYCOL 3350 17 GRAM ORAL POWDER PACKET: 17 17 gram | ORAL | Qty: 1 | Fill #0

## 2024-05-09 NOTE — Discharge Instructions (Signed)
 Shower 7 days following surgery, Remove dressing 7 days following surgery  In addition to Oxycodone, may take Tylenol up to 3,000mg /24 hours.  No Antiinflammatory's  OTC stool softners for constipation  If given Lumbar brace, utilize when OOB, remove to sleep and to shower.  Walk for exercise  No lifting, pushing, pulling greater than 10 pounds

## 2024-05-09 NOTE — Progress Notes (Signed)
 Physical Therapy Treatment  Progress Note    Patient Name: Molly Jensen  MRN: [id number]  DOB: 1950/12/13  Today's Date: 05/09/2024    Subjective      Pt doing well, happy to amb.     Objective     General Information  General Info  PT Received On: 05/09/24  Following Therapy Session:: Patient in Bed, Bed Alarm Activated, Nursing Staff Aware of Patient Location, Call bell within reach  Plan of Care Reviewed With:: Patient, RN/Charge Nurse  Recommended mobility with nursing staff: 1A w/ RW    Precautions  Precautions  Other Precautions: Fall Risk, Spine-No bending, Lifting, Twisting    Pain  Pain Assessment  Pain Assessment: 0-10  Pain Score: 7  Pain Type: Surgical pain, Chronic pain  Pain Location: Back     Pain Location: Back    Vital Signs       Cognition  Overall Cognitive Status: Within Functional Limits  Orientation Level: Oriented X4    Integumentary  Integumentary  Integumentary: PIV, R low back hemovac, visible skin intact, wound dressing clean & intact  LDA Integrity: Intact Pre and Post Therapy    ROM/Strength       Neuromuscular Assessment            Mobility Assessment  Bed Position: HOB flat, Use of bedrail  Roll Left: Supervision  Sit to Supine: Minimal Assist (B LE's)  Scooting: Independent    Sit to Stand: Supervision  Stand to Sit: Supervision  Assistive Device: Environmental health practitioner (Feet): 500+  Assistance Level: Supervision  Assistive Device: Tree surgeon Characteristics: reciprocal, decreased cadence, vc's to improve trunk posture  Ambulation Comments: pt enjoys walking - motivated to go far    Number of Stairs: 2 x 5  Assistance Level: Supervision  Assistive Device: None  Railing: Bilateral  Technique: Step-to up and down         Sitting Balance - Static: good  Standing Balance - Static: fair  Standing Balance - Dynamic: fair    AMPAC - (Basic Mobility Inpatient Short Form) How much help from another person do you currently need?  Turning from your back to your side while in  a flat bed without using bedrails?: 4 - None  Moving from lying on your back to sitting on the side of a flat bed without using bedrails?: 3 - A little  Moving to and from a bed to a chair (including a wheelchair)?: 3 - A little  Standing up from a chair using your arms (e.g., wheelchair, or bedside chair)?: 3 - A little  To walk in hospital room?: 3 - A little  Climbing 3-5 steps with a railing?: 3 - A little  Raw Score: 19         Therapeutic Exercise                 Education  Education  Education Provided: Discharge Planning, Mobility Training, Precautions, Activities Guidelines  Education Provided to: Patient  Teaching Method: Discussion, Teach Back  Barriers to learning: None evident  Learning Evaluation: Verbalizes understanding, Needs practice    Assessment   Pt. Able to increase amb distance and only requiring SBA this date.  Slight min A for LE's into bed- sister can assist. Anticipate cont progress towards goals in order to d/c home.    Goals  Short Term Goals  Date Established/Amended: 05/08/24  Goal timeframe: 2 weeks  Bed mobility goal: pt will be I w/  all bed mobility w/ adherence to spine precautions  Transfers goal: pt will perform all transfers mod I w/ LRAD  Ambulation goal: pt will amb <300 ft mod I w/ LRAD  Problem specific goal 2: states 3/3 spinal prec         Plan   Plan: Continued Skilled Inpatient PT Services   Treatment Interventions: Therapeutic exercise, Assistive device training, Functional mobility training, Gait training, Stair training, Patient/family education, Neuromuscular Re-education/balance  PT Frequency and Duration: 5-7 x/week until Discharge    Recommendations  Anticipate Discharge to: Home with support/services  Discharge Assist/Support Needed: Intermittent Supervision  Home services recommended: Home PT  Discharge Equipment Anticipated: Rolling Walker  Recommended mobility with nursing staff: 1A w/ RW    Leotis Hobby, PT, DPT  [place] lic # [id number]

## 2024-05-09 NOTE — Progress Notes (Signed)
 Orthopedic Inpatient Progress Note    Subjective   Mykayla Brinton reports complete resolution of lower extremity pain.  She does endorse incisional pain and flatus.  She denies bowel movement.  She is still very interested in discharge home.  Objective   Temp:  [36.4 C (97.6 F)-36.8 C (98.2 F)] 36.8 C (98.2 F)  Pulse:  [64-77] 69  Resp:  [17-20] 17  SpO2:  [90 %-97 %] 96 %  BP: (89-123)/(55-76) 110/70  Back Exam     Tenderness   The patient is experiencing tenderness in the lumbar.    Comments:  Sterile dressing clean and dry without staining, peeling up distally.  Bilateral active hip flexion to approximately 110 and 0 degrees of knee extension in a seated position  5 out of 5 resisted knee extension  Calves soft, supple without tenderness  Active range of motion of the ankles and toes with EHL intact  Sensation intact distally             Assessment   73 y.o. female is 2 Days Post-Op s/p Fusion, Spine, Lumbar, TLIF, N/A - Spine Lumbar     Plan   Continue po pain medications PRN  PT, WBAT with assistive devices PRN  RN to change dressing.  Patient tolerated Hemovac removal without difficulty.  Continue to advance diet  Planned d/c to home with services once clears PT/OT  F/u as outpt with:  Dr. Oneil LABOR Lapp  Phone Number: [phone]

## 2024-05-10 MED FILL — PRAVASTATIN 20 MG TABLET: 20 20 mg | ORAL | Qty: 1 | Fill #0

## 2024-05-10 MED FILL — GABAPENTIN 300 MG CAPSULE: 300 300 mg | ORAL | Qty: 1 | Fill #0

## 2024-05-10 MED FILL — OXYCODONE 5 MG TABLET: 5 5 mg | ORAL | Qty: 2 | Fill #0

## 2024-05-10 MED FILL — OXYCODONE 5 MG TABLET: 5 5 mg | ORAL | Qty: 1 | Fill #0

## 2024-05-10 MED FILL — CITALOPRAM 20 MG TABLET: 20 20 mg | ORAL | Qty: 1 | Fill #0

## 2024-05-10 MED FILL — LISINOPRIL 5 MG TABLET: 5 5 mg | ORAL | Qty: 2 | Fill #0

## 2024-05-10 MED FILL — DOCUSATE SODIUM 100 MG CAPSULE: 100 100 mg | ORAL | Qty: 1 | Fill #0

## 2024-05-10 MED FILL — BISACODYL 5 MG TABLET,DELAYED RELEASE: 5 5 mg | ORAL | Qty: 2 | Fill #0

## 2024-05-10 MED FILL — ACETAMINOPHEN 325 MG TABLET: 325 325 mg | ORAL | Qty: 2 | Fill #0

## 2024-05-10 MED FILL — POLYETHYLENE GLYCOL 3350 17 GRAM ORAL POWDER PACKET: 17 17 gram | ORAL | Qty: 1 | Fill #0

## 2024-05-10 MED FILL — AMLODIPINE 5 MG TABLET: 5 5 mg | ORAL | Qty: 1 | Fill #0

## 2024-05-10 MED FILL — ALPRAZOLAM 0.25 MG TABLET: 0.25 0.25 mg | ORAL | Qty: 2 | Fill #0

## 2024-05-10 NOTE — Progress Notes (Signed)
 05/10/24 0841   Case Management Discharge Note    Ambulance Log  Ambulance not needed     Case Management Progress Note: Patient medically cleared for discharge home with services. [facility] for SN & PT in place.   Suzen Pandy, RN  05/10/2024  8:42 AM

## 2024-05-10 NOTE — Discharge Summary (Cosign Needed)
 Discharge Summary    Discharge Diagnosis  Spinal stenosis, lumbar region with neurogenic claudication    Hospital Course    Patient was admitted 05/07/2024 and underwent an L4-5 TLIF. Patient tolerated procedure well transferred to PACU then the floor. She  was maintained on Tylenol  up to 3000 mg a day for mild-to-moderate pain.  Oxycodone  5 to 10 mg p.o. every 4 hours for moderate to severe pain.  Tizanidine  as needed as a muscle relaxant.   IV was decided when she was tolerating regular diet.   Advanced DA T when she had positive bowel sounds and positive flatus.   Physical therapy consult of ambulatory cyst spine precautions.   Patient does have a history of frequent falls, stenosis of the cervical spine and lumbar spine. Postoperatively patient has noticed improvement in her preoperative lower extremity complaints, she was cleared by Physical therapy to be discharged home with home services. She will be discharged home postop day 3 with home services.    Test Results Pending At Discharge   Postop lab work within normal limits  Pertinent Physical Exam At Time of Discharge  Physical Exam   Dressing intact   Neurologically intact to lower extremities.  Issues Requiring Follow-Up   Follow up with Dr. Alpheus in 2 weeks.   Shower postop day 7, remove dressing postop day 7.   She may take Tylenol  up to 3000 mg a day for mild-to-moderate pain   Oxycodone  5-10 mg p.o. every 6 hours as needed for moderate to severe pain.   Tizanidine  2 mg b.i.d. as needed for muscle spasms.   Ice to the low back / incisional area 20 minutes every hour as needed for pain control.   If provided a lumbar brace she may utilize the brace for comfort when up and about.      Patient was screened for Social Drivers of Health during this encounter and found to have concerns in the following domains:   financial insecurity    The After Visit Summary provided to the patient includes information for Find Help, a zip code based platform that assists  users with finding resources near them. The following interventions were also done:     Patient was screened for Social Drivers of Health during this encounter and found to have concerns in the following domains:   financial insecurity    The After Visit Summary provided to the patient includes information on how to find resources related to these needs.                            Outpatient Follow-Up  Future Appointments   Date Time Provider Department Center   06/05/2024  9:00 AM Antonia Bar, MD [facility] [place]   07/20/2024 11:00 AM Mustafa CHRISTELLA Daria Spears, MD [facility]   09/03/2024  1:45 PM Cherene Relic, NP [facility] [place]   04/22/2025 11:00 AM Beather CINDERELLA Shams, MD [facility] [place]

## 2024-05-10 NOTE — Nursing Note (Signed)
 Peripheral IV removed, discharge instructions reviewed in depth with patient and sister. All questions answered. Pt verbalized understanding of discharge plan and follow up care. Patient given rolling walker, and ice packs. Patient left with all belongings and discharge paperwork.

## 2024-05-10 NOTE — Progress Notes (Signed)
 Orthopedic Inpatient Progress Note    Subjective   Molly Jensen reports incisional pain control with narcotics.  She denies further lower extremity symptoms.  She is interested in just discharge home today.  Tolerating regular diet but still no BM.  Objective   Temp:  [36.6 C (97.9 F)-36.8 C (98.3 F)] 36.6 C (97.9 F)  Pulse:  [68-70] 68  Resp:  [18] 18  SpO2:  [94 %-98 %] 95 %  BP: (95-108)/(58-64) 98/60  Back Exam     Tenderness   The patient is experiencing tenderness in the lumbar.    Comments:  Silverlon dressing to the midline lower back clean, dry, intact  She is able to demonstrate bilateral leg lifts  Calves soft, supple without tenderness  Active range of motion of the ankle and toes with EHL intact  Sensation intact distally             Assessment   73 y.o. female is 3 Days Post-Op s/p Fusion, Spine, Lumbar, TLIF, N/A - Spine Lumbar     Plan   Continue po pain medications PRN  PT, WBAT with assistive devices PRN  Planned d/c to home with services once clears PT/OT  F/u as outpt with:  Dr. Oneil LABOR Lapp  Phone Number: [phone]

## 2024-05-11 ENCOUNTER — Encounter: Admit: 2024-05-11 | Discharge: 2024-05-11 | Payer: MEDICARE | Primary: Internal Medicine

## 2024-05-11 NOTE — Telephone Encounter (Signed)
 I am calling in regard to a referral for home health services we received from [facility]    I would like to confirm your address: [address] [place] [address]   Address confirmed: Yes    I would like to confirm your best contact number: [phone]   Contact number confirmed: Yes    Do you have any other home care services or agencies coming into your home?    Other agency present: No    Do you have another nurse or physical therapist visiting you at home?    Other staff present: No    Do you have an appointment scheduled with your doctor in the next 7 days?  MD follow-up appointment: No - Comment: NA    Can I confirm that RANELL LEGIONS, MD is your primary care physician?   PCP confirmed: Yes    If there are pets in the home, please ensure that your pet is in another room during the clinicians' home visits.     If there are weapons in the home, they need to be locked up and secured. If not, the clinician will need to leave the home.     Is anyone in the home experiencing flu-like symptoms or respiratory symptoms?    Flu-like symptoms: No    Did you have any new or changed medications that need to be picked up at the pharmacy?    New medications: N/A    Were you able to pick them up?    All medications in the home: N/A    Do you have the oxygen in your home?    Have oxygen at home: No    Do you have any supplies you need until our staff comes to your home?    Need supplies immediately: N/A    Could you please make your insurance card available for visit?    Could you please have all your medications available for your visit, both your prescriptions and any over-the-counter meds you are taking?    The services ordered for you are: Nursing and Physical Therapy    The team caring for you is [facility]. I am the scheduler for you and my name is or the scheduler for you is Skyla Champagne. My/their return number is [phone]. Please feel free to call me back if there is anything I can help with.     Comments:

## 2024-05-11 NOTE — Home Health (Signed)
 Patient age/gender/living situation/Primary Dx for homecare/Reason for Admission: Pt. is a 73 yo female who resides alone in a first floor apartment in elder housing.  Pt. sister is visiting to assist with post-op care.  Pt. was referred vna services s/p  L4-5 TLIF on 05/07/2024.  Pt. has PMH: HTN, HLD.       Current Problems requiring skilled service to be addressed by above interventions (clinical, mental, adl and functional mobility): Pt. requires continues sn services for pain mgmt, med mgmt, signs/sx of infection, safety/fall prevention, incision site care.     Psychosocial and cognitive issues identified which may impact plan of care: none     Any additional services requested based on risk assessment? PT eval pending     Homebound Status: Pt. is homebound r/t decreased strength/endurance/balance/coordination s/p TLIF L4-5.  Pt. requires supervision/assistance and two handed device to leave residence safely.     Patient Identified care representative: Kacie (sister)    Reviewed DC plan at [facility] with:  Patient Yes  Caregiver/representative Yes     Call to MD, Oneil LABOR Lapp,  to confirm Medications & Orders Obtained on 05/11/2024

## 2024-05-12 ENCOUNTER — Encounter: Admit: 2024-05-12 | Discharge: 2024-05-12 | Payer: MEDICARE | Primary: Internal Medicine

## 2024-05-12 NOTE — Home Health (Signed)
 PT EVALUATION: 73 yo Female with PMH of HTN, DJD, OA, neuropathy, radiculopathy presenting to hospital for s/p L4-5 spinal fusion., TLIF 9/18 w/ Dr. Alpheus. Reports she is ok since surgery. Has had in inc in pain today vs yesterday.     LIVING SITUATION: Lives in a studio apartment on the first floor, no stairs to enter. Typically lives alone but her sister is staying with her to assist her at this time.     PLOF: Patient was ambulating with an AD, (I) with ADLS, has h/o frequent falls with unknown causes.     EQUIPMENT AT HOME: RW, grab bar in shower- would benefit from additional grab bar    ROM: B LE WFL    BED MOBILITY: SBA with cuing required for log roll. Recommended obtaining bed rail and wedge pillow.     TRANSFERS: SBA with use of RW. Required cuing to push off of sitting surface vs pulling on walker.     GAIT: Ambulated 69' with use of RW. Adjusted RW to correct height as it was too high for her. Required cuing for proximity to AD.     PATIENT/CAREGIVER EDUCATION: Patient educated on s/sx of infection, s/sx of DVT. Educated on when to call surgeon versus go to ED. Educated on use of ice and elevation for edema control. Educated on side effects of pain medications and anticoagulants. Educated on frequent ambulation every hour and position changes to assist with pain relief. Reviewed spinal precautions.     WOUND: Distal portion of dressing beginning to roll up. Superior portion intact. Call made to make surgeon aware. Sarah from the office called back after visit and instructed to tape down edges of dressing. TC made to patient to have her tape down dressing edges. Good understanding by patient.       ASSESSMENT/PLAN: Patient is a 73 y/o F who presents to skilled PT with post op pain, impaired gait, impaired functional mobility, and is a fall risk, Patient will benefit from skilled PT to address impairments and maximize function. Plan for skilled PT per POC. Received VO from Sarah at Dr. Lucina office.

## 2024-05-14 ENCOUNTER — Encounter: Admit: 2024-05-14 | Discharge: 2024-05-14 | Payer: MEDICARE | Primary: Internal Medicine

## 2024-05-14 NOTE — Home Health (Signed)
 Skilled services required this visit to address clinical and/or functional declines resulting from the following problems:  Pt. is a 73 yo female who resides alone in a first floor apartment in elder housing. Pt. sister is visiting to assist with post-op care. Pt. was referred vna services s/p L4-5 TLIF on 05/07/2024. Pt. has PMH: HTN, HLD.       Patient progress toward goals this visit as evidenced by (compare to prior values): see flowsheet for full assessment. Pt reported 8/10 sharp pain at incision site. This RN assessed VSS and assessed incision site, OTA clean steri strips and incision site intact. This RN called providers office to, provider increased 5mg  oxy to 10mg  q6. Pt educated on importance incision care and symptoms of infection. Pt and caregiver verbalized understanding.        Patient is at risk of decline in the following areas if goals not met:    The patient is at significant risk for decompensation, which may lead to exacerbation of underlying disease processes. This risk is compounded by potential declines in functional abilities, decreased endurance, and an increased likelihood of falls. Decompensation may manifest as a deterioration in the patient's overall health status, which could further complicate existing medical conditions. As functional abilities decline, may experience limitations in performing daily activities, which could hinder independence and quality of life. A decrease in endurance will likely impact capacity to engage in physical activity, thereby exacerbating weakness and contributing to a cycle of reduced mobility

## 2024-05-15 ENCOUNTER — Encounter: Admit: 2024-05-15 | Discharge: 2024-05-15 | Payer: MEDICARE | Primary: Internal Medicine

## 2024-05-15 NOTE — Home Health (Signed)
 73 y/o F who presents to [facility] s/p L4-5 TLIF by Dr. Alpheus on 9/18. Patient seen for follow up PT visit. Denies any actue changes. Denies any falls. Reports pain has been better managed. Discussed pain management techniques to improve sleeping. Reviewed spinal precautions.  Reviewed log roll technique for bed mobility. Discussed frequent position changes to dec risk of PI. Good understanding. Participated in gait training with use of RW x40'. Required cuing for forward gaze vs looking at feet. Improved after cuing. Discussed gradually increasing ambulation distance as HEP. Patient is progressing towards goals. Plan to continue skilled PT for post op pain management, gait, and dec fall risk. Patient continues to remain homebound due to recent surgery and use of 2 handed device making it a taxing effort.

## 2024-05-18 ENCOUNTER — Encounter: Admit: 2024-05-18 | Discharge: 2024-05-18 | Payer: MEDICARE | Primary: Internal Medicine

## 2024-05-18 NOTE — Home Health (Signed)
 73 y/o F who presents to [facility] s/p L4-5 TLIF by Dr. Alpheus on 9/18. Patient seen for follow up PT visit. Denies any actue changes. Denies any falls. States she had SN visit this morning, declines need for vitals as they were WNL with SN. Reports poor sleep lastnight. States she did a lot of activity over the weekend. She has a follow up with Dr. Alpheus on Wednesday. Patient has obtained a bed rail. Educated on use of rail to assist with transfers. Patient states she has been spending most of her time in bed. Discussed importance of changing sitting surfaces and to not spend all day in bed.  Participated in gait training with use of RW x40'. Demonstrated improved gait this visit with improved forward gaze.  Discussed activity volume management to keep pain manageable. Patient is progressing towards goals. Plan to continue skilled PT for post op pain management, gait, and dec fall risk. Patient continues to remain homebound due to recent surgery and use of 2 handed device making it a taxing effort

## 2024-05-18 NOTE — Home Health (Signed)
 Skilled services required this visit to address clinical and/or functional declines resulting from the following problems:   Pt. is a 72 yo female who resides alone in a first floor apartment in elder housing. Pt. sister is visiting to assist with post-op care. Pt. was referred vna services s/p L4-5 TLIF on 05/07/2024. Pt. has PMH: HTN, HLD.   Patient progress toward goals this visit as evidenced by (compare to prior values): see flowshe et for full assessment. Pt reported 5/10 pain at incision site. Incision site CDI. This RN assessed VSS . Pt did not want to use 10mg  oxy d/t constipation. Has been using zanaflex  w/ better effect to her pain/spasms. This RN called providers office to update findings, providePt educated on importance incision care and symptoms of infection. Pt and caregiver verbalized understanding.   Patient is at risk of decline in the following areas  if goals not met:   The patient is at significant risk for decompensation, which may lead to exacerbation of underlying disease processes. This risk is compounded by potential declines in functional abilities, decreased endurance, and an increased likelihood of falls. Decompensation may manifest as a deterioration in the patient's overall health status, which could further complicate existing medical conditions. As functional abilities  decline, may experience limitations in performing daily activities, which could hinder independence and quality of life. A decrease in endurance will likely impact capacity to engage in physical activity, thereby exacerbating weakness and contributing to a cycle of reduced mobility

## 2024-05-19 ENCOUNTER — Encounter: Primary: Internal Medicine

## 2024-05-21 ENCOUNTER — Encounter: Admit: 2024-05-21 | Discharge: 2024-05-21 | Payer: MEDICARE | Primary: Internal Medicine

## 2024-05-21 ENCOUNTER — Encounter: Primary: Internal Medicine

## 2024-05-21 NOTE — Home Health (Signed)
 73 y/o F who presents to [facility] s/p L4-5 TLIF by Dr. Alpheus on 9/18. Seen for visit today. She had follow up at surgeon's office today. Declined vitals as she had MD appt today. Reports follow up went well. All hardware is in place. Incision is healing well with no concerns. Had at length discussion about weaning from RW. Discussed that due to history of frequent falls she would benefit from continued use of RW when ambulating vs SPC. Spoke to June at Dr. Lucina office about concern of falls if weaning from RW too soon. Discussed OP PT in 6 weeks and transitioning to [facility] there. Patient in agreement with plan. Discussed continued use of walking program and gradual activity inc. Patient in agreement with DC from home PT.

## 2024-05-22 ENCOUNTER — Encounter: Primary: Internal Medicine

## 2024-05-22 ENCOUNTER — Encounter: Admit: 2024-05-22 | Discharge: 2024-05-22 | Payer: MEDICARE | Primary: Internal Medicine

## 2024-05-27 ENCOUNTER — Encounter: Admit: 2024-05-27 | Discharge: 2024-05-27 | Payer: MEDICARE | Primary: Internal Medicine

## 2024-05-27 NOTE — Home Health (Signed)
 Patient seen today for nursing visit.  Patient is most pleasant alert and oriented female, patient had recent L-4 L-5 TILFPatient was recently discharged from physical therapy services, she will soon start outpatient physical therapy in the next 2 weeks.  Patient reports she is utilizing tizanidine  2-times a day for muscle spasms.  And Tylenol  extra strength 3 times a day.  Patient reports that she still experiencing constipation, patient reports she does have Colace she has been taking it every morning.  Recommended patient take Colace at night as well, increase her fiber intake, vegetables, and eat easily digestible foods.  Patient reports she did have a bowel movement yesterday but did have to strain.  Also recommended patient may try MiraLAX .  Her vital signs are within normal limits.  Patient surgical incision on lumbar area clean and dry small area noted to be open at bottom of incision no drainage present.  Discussed with patient monitoring for any signs or symptoms of infection.  Provided patient with 24-hour [place] medicine care at home number.  Next nursing visit  Pain management, spinal precautions, constipation

## 2024-05-27 NOTE — Unmapped (Signed)
 Verbal SOC was obtained on 05/11/2024.

## 2024-05-27 NOTE — Unmapped (Signed)
 I certify that this patient is confined to his/her home and needs intermittent skilled nursing care, physical therapy/ and/or speech therapy or continues to need occupational therapy.  This patient is under my care and I have authorized the services on this plan of care and will periodically review the plan. I further certify that this patient had a Face-to-Face Encounter performed by Oneil DELENA Lehmann, MD on 05/11/2024 or allowed non-physician practitioner that was related to the primary reason the patient requires Home Health services.

## 2024-06-02 ENCOUNTER — Encounter: Primary: Internal Medicine

## 2024-06-05 ENCOUNTER — Ambulatory Visit
Admit: 2024-06-05 | Discharge: 2024-06-05 | Payer: MEDICARE | Attending: Critical Care Medicine | Primary: Internal Medicine

## 2024-06-05 ENCOUNTER — Encounter: Admit: 2024-06-05 | Discharge: 2024-06-05 | Payer: MEDICARE | Primary: Internal Medicine

## 2024-06-05 DIAGNOSIS — J4540 Moderate persistent asthma, uncomplicated: Principal | ICD-10-CM

## 2024-06-05 MED ORDER — Breyna 160-4.5 mcg/actuation inhaler
160-4.5 | Freq: Two times a day (BID) | RESPIRATORY_TRACT | 2 refills | Status: AC
Start: 2024-06-05 — End: 2024-09-03

## 2024-06-05 NOTE — Home Health (Signed)
 Skilled services required this visit to address clinical and/or functional declines resulting from the following problems:    Patient greeted sitting in her room area, AAOx3,  using cane as assistive device,   pain reported to lower back, tolerable.  Surgical incision dry clean, patient scheduled to see surgeon 10/30  outpatient PT to follow.  Upon skin assessment patient noted moisture fissure that is uncomfortable per patient  advised to keep clean and dry, decrease pressure to affected area, fair teachback.  PCP's office made aware of patient's condition and insomnia, spoke to [place], awaiting on call back.  Patient continues to require the skills of a Nursing  to address the following next visit:  Patient will be seen by nursing for cardiac, pain assessment, skin integrity, GI/GU home medications teaching/management  Teaching provided on safety, fall precautions, hydration, energy conservation, patient verbalized understanding.  Patient made aware to call [facility] for any questions and concerns.

## 2024-06-05 NOTE — Progress Notes (Signed)
 [facility] - Pulmonary Clinic Note     Reason for appointment:  Follow up - Asthma    History of present illness:  Patient is 73 y.o.  I had the pleasure of seeing Molly Jensen in our pulmonary clinic today.  She has past medical history of diabetes, hypertension, neuropathy and severe spinal canal stenosis.  She is referred to us  for abnormal CAT scan.  Brief History: In September 2024 she had strep throat when she was visiting [place], she was given antibiotics. In October 2024 she tested positive for COVID-19 infection.  Subsequently she was given multiple antibiotics including azithromycin, doxycycline and Augmentin on outpatient basis.  She had persistent pulmonary symptoms.  Subsequently in December 2024 she tested positive for influenza B and was hospitalized briefly and was discharged on Tamiflu .  About a week or so later she returned to the hospital with frequent falls, was admitted for about a week for rib fractures and syncope leading to falls.  Her sputum cultures tested positive for Klebsiella at rehab prior and was given a course of levaquin .  About 3 weeks later she again went to the hospital for the third time in 08/2023 and was hospitalized for few days for severe spinal canal stenosis and recurrent falls.    She reported dyspnea when ambulating on ground level at long distance.  She reports intermittent cough, dry for the most part. She denies any childhood history of asthma.  She denies any prior history of pulmonary infections, pulmonary symptoms or pulmonary illnesses. I started her on maintenance inhalers with subsequent resolution of cough.   She underwent spinal fusion surgery in 04/2024 and was told to hold off her breyna  and only use ventolin  as per her. Her cough has returned.     [Following history obtained on initial encounter:  Cigarette Smoking: Never smoker   Inhalation of cigars/marijuana/vape/e-cigs/illicit drugs: Denies  Pets: Denies  Recent travel history:  [place] 05/2023.   Occupational/Inhalational exposures: Denies  Family history of pulmonary diseases: Denies  Lives: by herself]    Workup:  CT chest 02/2024: IMPRESSION:  1.  Mild bronchiectasis in the paravertebral bilateral lower lobes.  2.  No honeycombing.  3.  Mild mosaic pattern attenuation of the lung parenchyma on expiratory phase imaging. This finding is nonspecific. Correlate for small airways disease or mild hypersensitivity pneumonitis in the appropriate clinical setting.    PFTs 09/2023: Mild restriction, TLC 77% -1.79, normal DLCO. [BMI 32].    CT chest 08/2023: IMPRESSION: Right posterolateral ninth and 10th rib fractures.    CTA chest 07/2023: IMPRESSION:  1.  Mild nonspecific diffuse interstitial prominence, which could represent edema or atypical pneumonia in the appropriate clinical setting.  No evidence of pulmonary embolus.  2.  Other incidental findings as above.    Echo 07/2023: Interpretation Summary  LVEF = 60%.  The left ventricular wall motion is normal.  There is trace/physiologic mitral regurgitation present  There is trace aortic regurgitation.  The right ventricle is normal in size and function.     Current Medications: Current Medications[1]    Past Medical History:  Problem List:  2025-07: Spinal stenosis, lumbar region with neurogenic claudication  2025-07: Radiculopathy, lumbar region  2025-05: Spinal stenosis of lumbar region without neurogenic   claudication  2025-05: Primary hypertension  2025-01: Fall, initial encounter  2025-01: Multiple falls  2025-01: Recurrent falls  2025-01: Syncope, unspecified syncope type  2025-01: Syncope  2025-01: Syncope and collapse  2024-12: Influenza B  [id number]: Influenza due to influenza virus, type B  2024-06: Primary osteoarthritis of right knee  2024-06: Primary osteoarthritis of left knee  2024-06: Osteoarthritis of left knee  2024-06: Osteoarthritis of right knee  2024-06: Metatarsalgia  2024-06: Numbness of foot  2023-05: Nuclear sclerotic  cataract of both eyes  2023-05: Refractive error  2023-01: Posterior vitreous detachment of both eyes  2023-01: Vitreous hemorrhage of right eye  Hypertension  Degenerative joint disease  Cervical stenosis of spinal canal  Prediabetes  Neuropathy  Closed fracture of right proximal humerus     Past Surgical History:  Surgical History[2]    Family History:  Family History[3]    Social History:  Social History     Socioeconomic History    Marital status: Widowed     Spouse name: Not on file    Number of children: Not on file    Years of education: Not on file    Highest education level: Not on file   Occupational History    Not on file   Tobacco Use    Smoking status: Never     Passive exposure: Never    Smokeless tobacco: Never   Vaping Use    Vaping status: Never Used   Substance and Sexual Activity    Alcohol use: Never    Drug use: Never    Sexual activity: Defer   Other Topics Concern    Not on file   Social History Narrative    Not on file     Social Determinants of Health     Financial Resource Strain: Medium Risk (05/07/2024)    Overall Financial Resource Strain (CARDIA)     Difficulty of Paying Living Expenses: Somewhat hard   Food Insecurity: No Food Insecurity (05/07/2024)    Hunger Vital Sign     Worried About Running Out of Food in the Last Year: Never true     Ran Out of Food in the Last Year: Never true   Transportation Needs: No Transportation Needs (05/11/2024)    OASIS A1250: Transportation     Lack of Transportation (Medical): No     Lack of Transportation (Non-Medical): No     Patient Unable or Declines to Respond: No   Physical Activity: Inactive (02/11/2024)    Exercise Vital Sign     Days of Exercise per Week: 0 days     Minutes of Exercise per Session: 0 min   Stress: Stress Concern Present (02/11/2024)    [employer] of Occupational Health - Occupational Stress Questionnaire     Feeling of Stress : To some extent   Social Connections: Feeling Socially Isolated (05/11/2024)    OASIS D0700:  Social Isolation     Frequency of experiencing loneliness or isolation: Often   Intimate Partner Violence: Not At Risk (02/11/2024)    Humiliation, Afraid, Rape, and Kick questionnaire     Fear of Current or Ex-Partner: No     Emotionally Abused: No     Physically Abused: No     Sexually Abused: No   Housing Stability: Low Risk (05/07/2024)    Housing Stability Vital Sign     Unable to Pay for Housing in the Last Year: No     Number of Times Moved in the Last Year: 0     Homeless in the Last Year: No     Allergies:  Latex    Review of Systems:  ROS pertinent to pulmonary are as per HPI. All other systems are reviewed and  negative.    Vitals:  BP 128/68   Pulse 73   Temp 35.9 C (96.7 F)   Ht 1.575 m   Wt 75.3 kg   SpO2 97%   BMI 30.36 kg/m           Physical Exam:  Gen: NAD, sitting comfortably  HEENT: PERRL, moist mucus membranes, Mallampati 3  CV: RRR, S1 and S2  Pulm: Breathing comfortably, CTAB  Abd: soft, non tender, non distended  Ext: no edema, no cyanosis noted  Neuro: Alert, conversing well  Psych: Calm, cooperative          Assessment and Plan:  Diagnoses and all orders for this visit:    Moderate persistent asthma, uncomplicated  -     Breyna  160-4.5 mcg/actuation inhaler; Inhale 2 puffs in the morning and at bedtime. Rinse mouth with water  after use to reduce aftertaste and incidence of candidiasis. Do not swallow.  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   In October 2024 she tested positive for COVID-19 infection.    Subsequently she was given multiple antibiotics including azithromycin, doxycycline and Augmentin on outpatient basis.    She had persistent pulmonary symptoms.    In December 2024 she tested positive for influenza B and was hospitalized briefly and was discharged on Tamiflu .    In Jan 2025 her sputum cultures showed Klebsiella at rehab and was given a course of levaquin .    She denies any prior history of recurrent infections, she is a non smoker.  She also reported persistent intermittent dry cough, ventolin  PRN helped.  PFTs 09/2023: No fixed obstruction.   HRCT in 02/2024 showed air trapping consistent with small obstructive airway disease like asthma.   Given clinic history, clinical response to BD therapy and expiratory air trapping on CT chest, I diagnosed her with asthma.   I started her on breo-100 in 11/2023 with resolution of cough, no infections since.   Due to mouth sores, breo was switched to breyna -160 in 02/2024.   She underwent spinal fusion surgery in 04/2024 and was told to hold off her breyna  and only use ventolin  as per her. Her cough has returned.   Plan:  - Strongly advised to resume breyna -160 with spacer. Spacer given today. Discussed inhaler technique, rinse mouth after every use.   - She is doing much better after spinal surgery, hopefully with improved mobility deconditioning will improve.   - Continue PRN ventolin .   - If cough does not resolve on breyna  after a few weeks, advised to reach out, may need a short course of prednisone +/- add LAMA.     2) Lung opacities  CTA chest in 07/2023 during hospitalization for influenza B showed no PE, showed mild bibasilar reticular opacities. No prior CT chests to compare.  CT chest in 08/2023 during hospitalization for falls/rib fractures showed worsened bilateral bibasilar reticular opacities and atelectasis.   PFTs 09/2023: Mild restriction, TLC 77% -1.79, normal DLCO. [BMI 32].  HRCT 02/2024 showed mild bronchiectasis and reticular opacities in the medial paravertebral region of bil lower lobes as well as in lingula and RML. There is air trapping on expiratory images.   She has had recurrent infections and hospitalization, this is likely  a consequence of that.   Plan:  - Discussed results with her in detail previously,  likely consequence of previous infections. Will repeat imaging if clinically indicated.     Follow up:  4 months      Molly Bar MD  Pulmonary Physician   [facility]     Disclaimer: Part of the note has been dictated using voice recognition software and hence may contain errors that may have been overlooked. If you are the patient reviewing the medical records, please note that this documentation is written by a physician to document clinical course in the most efficient way possible often using abbreviations and for other medical professionals but are not specifically written with patient consumption in mind.          [1]   Current Outpatient Medications:     Ventolin  HFA 90 mcg/actuation inhaler, Inhale 1 puff every 4 (four) hours if needed., Disp: , Rfl:     acetaminophen  (Tylenol ) 500 mg tablet, Take 1,000 mg by mouth every 6 (six) hours if needed for pain score 1-3 (mild)., Disp: , Rfl:     amLODIPine  (Norvasc ) 5 mg tablet, Take 5 mg by mouth once daily., Disp: , Rfl:     Breyna  160-4.5 mcg/actuation inhaler, Inhale 2 puffs in the morning and at bedtime. Rinse mouth with water  after use to reduce aftertaste and incidence of candidiasis. Do not swallow., Disp: 3 each, Rfl: 2    citalopram  (CeleXA ) 20 mg tablet, Take 1 tablet (20 mg) by mouth once daily., Disp: 30 tablet, Rfl: 0    docusate sodium  (Colace) 100 mg capsule, Take 1 capsule (100 mg) by mouth twice daily., Disp: 60 capsule, Rfl: 0    gabapentin  (Neurontin ) 300 mg capsule, TAKE 1 CAPSULE(300 MG) BY MOUTH TWICE DAILY, Disp: 60 capsule, Rfl: 3    lisinopril  10 mg tablet, Take 1 tablet (10 mg) by mouth once daily. (Patient taking differently: Take 20 mg by mouth once daily. Indications: high blood pressure), Disp: 90 tablet, Rfl: 3    pravastatin  (Pravachol ) 10 mg tablet, Take 1 tablet (10 mg) by mouth once daily. (Patient taking differently: Take 10 mg by mouth at bedtime.), Disp: 90 tablet, Rfl: 3    tiZANidine  (Zanaflex ) 2  mg capsule, Take 1 capsule (2 mg) by mouth if needed in the morning and at bedtime for muscle spasms for up to 10 days., Disp: 20 capsule, Rfl: 1    tolterodine  LA (Detrol  LA) 4 mg 24 hr capsule, Take 4 mg by mouth before bedtime., Disp: , Rfl:   [2]   Past Surgical History:  Procedure Laterality Date    CESAREAN SECTION, LOW TRANSVERSE      x2    CHOLECYSTECTOMY      COLECTOMY      AT AGE 9    CTA CHEST W AND WO CONTRAST  08/12/2023    CTA CHEST W AND WO CONTRAST 08/12/2023 [facility] CT    DILATION AND CURETTAGE OF UTERUS      x2    HYSTERECTOMY      PR ARTHRODESIS COMBINED TQ [id number] LUMBAR N/A 05/07/2024    Procedure: Fusion, Spine, Lumbar, TLIF;  Surgeon: Oneil DELENA Lehmann, MD;  Location: [facility] OR;  Service: Orthopedic Surgery   [3]   Family History  Problem Relation Name Age of Onset    No Known Problems Mother      No Known Problems Father

## 2024-06-10 ENCOUNTER — Encounter: Primary: Internal Medicine

## 2024-06-10 ENCOUNTER — Encounter: Admit: 2024-06-10 | Discharge: 2024-06-10 | Payer: MEDICARE | Primary: Internal Medicine

## 2024-06-10 NOTE — Home Health (Signed)
 Patient seen today for nursing visit, patient reports she is doing well.  Patient did have a few episodes of vomiting last week after she took her tizanidine .  Patient has not taken any tizanidine  since last Thursday, but patient also reports she has not had any further vomiting episodes since stopping the tizanidine .  She denies any signs or symptoms of UTI, patient reports she has been moving her bowels at least every day to every other day.  She has positive bowel sounds x 4.    She denies any current nausea or vomiting or diarrhea episodes     patient states she go to her PCP yesterday she received a flu shot and her left upper arm, instructed patient to monitor for fever chills body aches he was advised Tylenol  for aches and pains ice for her arm.  She has her surgical 6-week follow-up next week.  Patient is up for nursing discharge next week.  Patient agrees and states that she believes she is ready charge.  Notice of Medicare noncoverage electronically signed by patient.  Instructed patient to call [facility] if she has any questions or concerns patient verbalized understanding.

## 2024-06-17 ENCOUNTER — Inpatient Hospital Stay: Admit: 2024-06-17 | Payer: MEDICARE | Primary: Internal Medicine

## 2024-06-17 ENCOUNTER — Encounter: Primary: Internal Medicine

## 2024-06-17 ENCOUNTER — Encounter: Admit: 2024-06-17 | Discharge: 2024-06-17 | Payer: MEDICARE | Primary: Internal Medicine

## 2024-06-17 DIAGNOSIS — I6523 Occlusion and stenosis of bilateral carotid arteries: Principal | ICD-10-CM

## 2024-06-17 NOTE — Home Health (Signed)
 Patient seen today for nursing visit.  Patient is doing well, she has her follow-up with Dr. Alpheus tomorrow weeks postsurgical.  Patient reports she has been doing more walking.  She is not using the tizanidine .  She seldom uses the Tylenol  for pain as she feels it does not help effectively.  She will discuss tomorrow at postsurgical follow-up about using Advil .  As this is what she was using prior to surgery.  Discussed with patient on good bowel regimen to promote bowel movements.  Patient still has intermittent constipation.  Suggested she increase fiber intake, easily digestible foods, and stimulant such as smooth move tea.  She does take her Colace daily.  Patient set for nursing discharge next week.  Patient reports she feels she is ready for discharge from nursing services, she also feels she may be ready to discharge her home health aide that intermittently helps with making her bed and laundry.  As patient states she has been doing more for herself.  Notice of Medicare noncoverage reviewed with patient signed electronically.  Copy left in home  Next nursing visit  Constipation, pain,

## 2024-06-19 ENCOUNTER — Encounter: Primary: Internal Medicine

## 2024-06-24 ENCOUNTER — Encounter: Primary: Internal Medicine

## 2024-06-24 ENCOUNTER — Encounter: Admit: 2024-06-24 | Discharge: 2024-06-24 | Payer: MEDICARE | Primary: Internal Medicine

## 2024-06-24 NOTE — Home Health (Signed)
 Reason for admission:      Patient was admitted 05/07/2024 and underwent an L4-5 TLIF. Patient tolerated procedure well transferred to PACU then the floor. She  was maintained on Tylenol  up to 3000 mg a day for mild-to-moderate pain.  Oxycodone  5 to 10 mg p.o. every 4 hours for moderate to Physical therapy consult of ambulatory cyst spine precautions. Patient does have a history of frequent falls, stenosis of the cervical spine and lumbar spine. Postoperatively patient has noticed improvement in her preoperative lower extremity complaints, she was cleared by Physical therapy to be discharged home with home services.               General Synopsis of Care provided:     Patient has been receiving skilled nursing services and physical therapy for management of disease process, endurance and strengthening. Patient has made good progress  and being discharged from home care services as all established goals have been met and clinical stability has been achieved.          Status of goals:     All set goals has been met and patient is clinically stable to be discharge. Family and caregiver has been trained with all necessary skills and resources to assist patient with medical needs.           Skill provided this visit:      The patient has been provided with clear instructions to promptly communicate with his primary care provider should any changes occur in his current health status, including but not limited to new symptoms, worsening conditions, or unexpected developments. It is crucial for the patient to prioritize seeking medical attention. Patient and caregiver have been instructed to promptly follow up with her primary care provider should any changes or concerns arise. The patient has been advised to adhere to her medication regimen, maintain recommended lifestyle modifications, and attend scheduled outpatient appointments for ongoing management and assessment. Additionally, physical therapy will continue as  previously planned, with the patient encouraged to remain compliant with her therapy exercises and appointments. The care team will continue to monitor her progress through outpatient follow-up, and any necessary adjustments to her care plan will be made based on her evolving needs. The patient and caregiver have been provided with contact information for healthcare support and instructed to seek immediate care if there is any sudden deterioration in her condition. Follow-up and reassessment will be scheduled as appropriate to ensure ongoing stability and well-being.

## 2024-06-25 ENCOUNTER — Encounter: Primary: Internal Medicine

## 2024-06-26 ENCOUNTER — Encounter: Primary: Internal Medicine

## 2024-06-27 ENCOUNTER — Encounter: Primary: Internal Medicine

## 2024-07-01 ENCOUNTER — Encounter: Primary: Internal Medicine

## 2024-07-08 ENCOUNTER — Encounter: Primary: Internal Medicine

## 2024-07-20 ENCOUNTER — Encounter: Payer: MEDICARE | Attending: Student in an Organized Health Care Education/Training Program | Primary: Internal Medicine

## 2024-07-22 ENCOUNTER — Inpatient Hospital Stay: Admit: 2024-07-22 | Payer: MEDICARE | Primary: Internal Medicine

## 2024-07-22 ENCOUNTER — Inpatient Hospital Stay
Admit: 2024-07-22 | Discharge: 2024-07-23 | Disposition: A | Discharge status: Home / Self Care | Payer: MEDICARE | Arrived: WI | Attending: Emergency Medicine

## 2024-07-22 DIAGNOSIS — M7989 Other specified soft tissue disorders: Principal | ICD-10-CM

## 2024-07-22 DIAGNOSIS — I82442 Acute embolism and thrombosis of left tibial vein: Principal | ICD-10-CM

## 2024-07-22 LAB — PROTIME-INR
INR: 1.02
Protime: 10.9 s (ref 9.3–11.6)

## 2024-07-22 LAB — CBC WITH DIFFERENTIAL
Basophils %: 0.6 %
Basophils Absolute: 0.04 K/uL (ref 0.00–0.22)
Eosinophils %: 1.8 %
Eosinophils Absolute: 0.13 K/uL (ref 0.00–0.50)
Hematocrit: 41.5 % (ref 32.0–47.0)
Hemoglobin: 13.3 g/dL (ref 11.0–16.0)
Immature Granulocytes %: 0.1 %
Immature Granulocytes Absolute: 0.01 K/uL (ref 0.00–0.10)
Lymphocyte %: 30.4 %
Lymphocytes Absolute: 2.2 K/uL (ref 0.70–4.00)
MCH: 29.4 pg (ref 26.0–34.0)
MCHC: 32 g/dL (ref 31.0–37.0)
MCV: 91.8 fL (ref 80.0–100.0)
MPV: 8.8 fL — ABNORMAL LOW (ref 9.1–12.4)
Monocytes %: 6.8 %
Monocytes Absolute: 0.49 K/uL (ref 0.36–0.77)
NRBC %: 0 % (ref 0.0–0.0)
NRBC Absolute: 0 K/uL (ref 0.00–2.00)
Neutrophil %: 60.3 %
Neutrophils Absolute: 4.37 K/uL (ref 1.50–7.95)
Platelets: 416 K/uL — ABNORMAL HIGH (ref 150–400)
RBC: 4.52 M/uL (ref 3.70–5.20)
RDW-CV: 13.4 % (ref 11.5–14.5)
RDW-SD: 45.8 fL (ref 35.0–51.0)
WBC: 7.2 K/uL (ref 4.0–11.0)

## 2024-07-22 LAB — COMPREHENSIVE METABOLIC PANEL
ALT: 21 U/L (ref 0–55)
AST: 17 U/L (ref 6–42)
Albumin: 3.7 g/dL (ref 3.2–5.0)
Alkaline phosphatase: 138 U/L — ABNORMAL HIGH (ref 30–130)
Anion Gap: 7 mmol/L (ref 3–14)
BUN: 10 mg/dL (ref 6–24)
Bilirubin, total: 0.5 mg/dL (ref 0.2–1.2)
CO2 (Bicarbonate): 25 mmol/L (ref 20–32)
Calcium: 9.4 mg/dL (ref 8.5–10.5)
Chloride: 109 mmol/L (ref 98–110)
Creatinine: 0.99 mg/dL (ref 0.55–1.30)
Glucose: 93 mg/dL (ref 70–110)
Potassium: 3.4 mmol/L — ABNORMAL LOW (ref 3.6–5.2)
Protein, total: 7.6 g/dL (ref 6.0–8.4)
Sodium: 141 mmol/L (ref 135–146)
eGFRcr: 61 mL/min/1.73m*2 (ref >=60)

## 2024-07-22 LAB — TYPE AND SCREEN
ABORh: O POS
Antibody Screen: NEGATIVE

## 2024-07-22 LAB — MAGNESIUM: Magnesium: 2 mg/dL (ref 1.6–2.6)

## 2024-07-22 LAB — PTT: aPTT: 26 s (ref 23–32)

## 2024-07-22 NOTE — ED Triage Notes (Signed)
 Pt reports pain for quite awhile Pt states swelling started x 1 week ago to left calf, Outpatient US  order, Pt arrives from US  with a + DVT upper left calf.

## 2024-07-22 NOTE — ED Notes (Signed)
 Patient reports left leg pain. Patient has strong left pedal pulse. Patient able to independently get out of chair to stretcher without difficulty.      Duwaine Fire, RN  07/22/24 [phone]

## 2024-07-22 NOTE — ED Provider Notes (Signed)
 [facility]  [address]  [place] [address]        HPI   Chief Complaint   Patient presents with    Leg Pain        This is a 73 year old female who has a history of low back surgery 2-1/2 months ago.  This was September 18.  She had this for sciatica and falls.  She has not fallen in 3 months.    She comes in with 1 week of left calf discomfort with intermittent swelling especially with walking.    She has no chest pain.  No shortness of breath.  No nausea vomiting or diarrhea.  No signs of discoloration or ischemia of the leg.  Normal DP and PT pulse in the left lower extremity.    No GI bleeding.  No history of intracranial hemorrhage.  No fall or head injury for at least 3 months      History provided by: I saw the patient in room 9 at 6:45 PM.      Patient History   Medical History[1]  Surgical History[2]  Family History[3]  Social History     Tobacco Use    Smoking status: Never     Passive exposure: Never    Smokeless tobacco: Never   Vaping Use    Vaping status: Never Used   Substance Use Topics    Alcohol use: Never    Drug use: Never       Review of Systems   Review of Systems   Constitutional:  Negative for fever.   Respiratory: Negative.     Cardiovascular: Negative.    Gastrointestinal: Negative.    Musculoskeletal:         See hpi       Physical Exam   ED Triage Vitals [07/22/24 1408]   Temp Pulse Resp BP   36.7 C (98.1 F) 83 16 (!) 168/99      SpO2 Temp Source Heart Rate Source Patient Position   98 % Oral Monitor Sitting      BP Location FiO2 (%)     Left arm --       Physical Exam  Vitals reviewed.   Constitutional:       General: She is not in acute distress.     Appearance: Normal appearance. She is not ill-appearing or toxic-appearing.   HENT:      Head: Normocephalic and atraumatic.   Eyes:      Conjunctiva/sclera: Conjunctivae normal.   Pulmonary:      Effort: Pulmonary effort is normal.   Musculoskeletal:         General: No swelling.      Cervical back:  Neck supple.      Right lower leg: No edema.      Left lower leg: No edema.      Comments: Left lower extremity with no pitting edema.  Mild discomfort in the calf.  Normal DP and PT pulse.  Normal color and perfusion   Skin:     General: Skin is warm and dry.   Neurological:      General: No focal deficit present.      Mental Status: She is alert and oriented to person, place, and time.   Psychiatric:         Mood and Affect: Mood normal.         Behavior: Behavior normal.       No orders to display  Labs Reviewed   COMPREHENSIVE METABOLIC PANEL - Abnormal       Result Value    Sodium 141      Potassium 3.4 (*)     Chloride 109      CO2 (Bicarbonate) 25      Anion Gap 7      BUN 10      Creatinine 0.99      eGFRcr 61      Glucose 93      Fasting? Unknown      Calcium 9.4      AST 17      ALT 21      Alkaline phosphatase 138 (*)     Protein, total 7.6      Albumin 3.7      Bilirubin, total 0.5     CBC WITH DIFFERENTIAL - Abnormal    WBC 7.2      RBC 4.52      Hemoglobin 13.3      Hematocrit 41.5      MCV 91.8      MCH 29.4      MCHC 32.0      RDW-CV 13.4      RDW-SD 45.8      Platelets 416 (*)     MPV 8.8 (*)     Neutrophil % 60.3      Lymphocyte % 30.4      Monocytes % 6.8      Eosinophils % 1.8      Basophils % 0.6      Immature Granulocytes % 0.1      NRBC % 0.0      Neutrophils Absolute 4.37      Lymphocytes Absolute 2.20      Monocytes Absolute 0.49      Eosinophils Absolute 0.13      Basophils Absolute 0.04      Immature Granulocytes Absolute 0.01      NRBC Absolute 0.00     MAGNESIUM  - Normal    Magnesium  2.0     PROTIME-INR - Normal    Protime 10.9      INR 1.02     PTT - Normal    aPTT 26     CBC W/DIFF    Narrative:     The following orders were created for panel order CBC and differential.  Procedure                               Abnormality         Status                     ---------                               -----------         ------                     CBC w/ Differential[[id number]]           Abnormal            Final result                 Please view results for these tests on the individual orders.   TYPE AND SCREEN    ABORh O POS      Antibody Screen NEG  Specimen Expiration Date 07/25/2024 23:59         Glasgow Coma Scale Score: 15      ED Course & MDM   Diagnoses as of 07/22/24 1904   Acute deep vein thrombosis (DVT) of tibial vein of left lower extremity       Medical Decision Making  Pt with no bleeding hx.  She has a dvt.  No symptoms of pe. I think reasonable to initiate eliquis  now.  Discussed symptoms and signs of bleeding need return to the ED.  Discussed that she needs to be evaluated for any trauma.  I have discussed the workup with the patient today and have answered all questions.  I have advised return to the Emergency Department if they worsen and I've discussed specific signs of worsening of their condition.     Will f/u with pcp for duration and further w/u.     I suspect that this is a dvt that was provoked by surgery recently and not an unprovoked dvt though the duration of time since surgery is getting a bit long.      Problems Addressed:  Acute deep vein thrombosis (DVT) of tibial vein of left lower extremity: complicated acute illness or injury with systemic symptoms    Risk  Prescription drug management.                              [1]   Past Medical History:  Diagnosis Date    Asthma     Cervical stenosis of spinal canal     States, numbness in left thumb    Closed fracture of right proximal humerus     Degenerative joint disease     Hyperlipidemia     Hypertension     Neuropathy     Overactive bladder     Prediabetes     Recurrent falls     Spinal stenosis, lumbar region with neurogenic claudication     States, pain/numbness/tingling/weakness causing falls    Vitreous hemorrhage of right eye    [2]   Past Surgical History:  Procedure Laterality Date    CESAREAN SECTION, LOW TRANSVERSE      x2    CHOLECYSTECTOMY      COLECTOMY      AT AGE 69    CTA CHEST W AND WO  CONTRAST  08/12/2023    CTA CHEST W AND WO CONTRAST 08/12/2023 [facility] CT    DILATION AND CURETTAGE OF UTERUS      x2    HYSTERECTOMY      PR ARTHRODESIS COMBINED TQ 1NTRSPC LUMBAR N/A 05/07/2024    Procedure: Fusion, Spine, Lumbar, TLIF;  Surgeon: Oneil DELENA Lehmann, MD;  Location: [facility] OR;  Service: Orthopedic Surgery   [3]   Family History  Problem Relation Name Age of Onset    No Known Problems Mother      No Known Problems Father          Carliss Relic, MD  07/22/24 2307

## 2024-07-22 NOTE — ED Notes (Signed)
 Strong pedal pulses      Dayton Painter, RN  07/22/24 [phone]

## 2024-07-22 NOTE — Discharge Instructions (Signed)
 As we discussed you were going to start Eliquis  to treat this DVT.  It is up to your primary care doctor the duration of treatment.    Please take 10 mg twice daily for 1 week and then subsequently you are going to take 5 mg twice daily until this medication is stopped by your PCP.    You are more likely to have bleeding.  If you have bleeding in the bowels, urine, or have trauma to your head you need to return to the emergency department to be seen urgently.    Please discuss  your travel that is up and coming with your primary care doctor before going

## 2024-07-22 NOTE — Patient Pass (Signed)
 Patient Education  Table of Contents   Deep Vein Thrombosis    To view videos and all your education online visit,  [email or web address].elsevier.com/[email or web address]  or scan this QR code with your smartphone.  Access to this content will expire in one year.  Deep Vein Thrombosis    Deep vein thrombosis (DVT) is a condition in which a blood clot forms in a vein of the deep venous system. This can occur in the lower leg, thigh, pelvis, arm, or neck. A clot is blood that has thickened into a gel or solid. This condition is serious and can be life-threatening if the clot travels to the arteries of the lungs and causes a blockage (pulmonary embolism). A DVT can also damage veins in the leg, which can lead to long-term venous disease, leg pain, swelling, discoloration, and ulcers or sores (post-thrombotic syndrome).  What are the causes?  This condition may be caused by:   A slowdown of blood flow.   Damage to a vein.   A condition that causes blood to clot more easily, such as certain bleeding disorders.  What increases the risk?  The following factors may make you more likely to develop this condition:   Obesity.   Being older, especially older than age 66.   Being inactive or not moving around (sedentary lifestyle). This may include:  ? Sitting or lying down for longer than 4?6 hours other than to sleep at night.  ? Being in the hospital, or having major or lengthy surgery.  ? Having any recent bone injuries, such as breaks (fractures), that reduce movement, especially in the lower extremities.  ? Having recent orthopedic surgery on the lower extremities.   Being pregnant, giving birth, or having recently given birth.   Taking medicines that contain estrogen, such as birth control or hormone replacement therapy.   Using products that contain nicotine or tobacco, especially if you use hormonal birth control.   Having a history of a blood vessel disease (peripheral vascular disease) or congestive heart disease.   Having a history of  cancer, especially if being treated with chemotherapy.  What are the signs or symptoms?  Symptoms of this condition include:   Swelling, pain, pressure, or tenderness in an arm or a leg.   An arm or a leg becoming warm, red, or discolored.   A leg turning very pale or blue. You may have a large DVT. This is rare.  If the clot is in your leg, you may notice that symptoms get worse when you stand or walk.  In some cases, there are no symptoms.  How is this diagnosed?  This condition is diagnosed with:   Your medical history and a physical exam.   Tests, such as:  ? Blood tests to check how well your blood clots.  ? Doppler ultrasound. This is the best way to find a DVT.  ? CT venogram. Contrast dye is injected into a vein, and X-rays are taken to check for clots. This is helpful for veins in the chest or pelvis.  How is this treated?  Treatment for this condition depends on:   The cause of your DVT.   The size and location of your DVT, or having more than one DVT.   Your risk for bleeding or developing more clots.   Other medical conditions you may have.  Treatment may include:   Taking a blood thinner medicine (anticoagulant) to prevent more clots from forming or current clots from growing.  Wearing compression stockings.   Injecting medicines into the affected vein to break up the clot (catheter-directed thrombolysis).   Surgical procedures, when DVT is severe or hard to treat. These may be done to:  ? Isolate and remove your clot.  ? Place an inferior vena cava (IVC) filter. This filter is placed into a large vein called the inferior vena cava to catch blood clots before they reach your lungs.  You may get some medical treatments for 6 months or longer.  Follow these instructions at home:  If you are taking blood thinners:   Talk with your health care provider before you take any medicines that contain aspirin or NSAIDs, such as ibuprofen . These medicines increase your risk for dangerous bleeding.   Take your  medicine exactly as told, at the same time every day. Do not skip a dose. Do not take more than the prescribed dose. This is important.   Ask your health care provider about foods and medicines that could change or interact with the way your blood thinner works. Avoid these foods and medicines if you are told to do so.   Avoid anything that may cause bleeding or bruising. You may bleed more easily while taking blood thinners.  ? Be very careful when using knives, scissors, or other sharp objects.  ? Use an electric razor instead of a blade.  ? Avoid activities that could cause injury or bruising, and follow instructions for preventing falls.  ? Tell your health care provider if you have had any internal bleeding, bleeding ulcers, or neurologic diseases, such as strokes or cerebral aneurysms.   Wear a medical alert bracelet or carry a card that lists what medicines you take.  General instructions   Take over-the-counter and prescription medicines only as told by your health care provider.   Return to your normal activities as told by your health care provider. Ask your health care provider what activities are safe for you.   If recommended, wear compression stockings as told by your health care provider. These stockings help to prevent blood clots and reduce swelling in your legs. Never wear your compression stockings while sleeping at night.   Keep all follow-up visits. This is important.  Where to find more information   American Heart Association: www.heart.org   Centers for Disease Control and Prevention: footballexhibition.com.br   National Heart, Lung, and Blood Institute: popsteam.is  Contact a health care provider if:   You miss a dose of your blood thinner.   You have unusual bruising or other color changes.   You have new or worse pain, swelling, or redness in an arm or a leg.   You have worsening numbness or tingling in an arm or a leg.   You have a significant color change (pale or blue) in the extremity that has  the DVT.  Get help right away if:   You have signs or symptoms that a blood clot has moved to the lungs. These may include:  ? Shortness of breath.  ? Chest pain.  ? Fast or irregular heartbeats (palpitations).  ? Light-headedness, dizziness, or fainting.  ? Coughing up blood.   You have signs or symptoms that your blood is too thin. These may include:  ? Blood in your vomit, stool, or urine.  ? A cut that will not stop bleeding.  ? A menstrual period that is heavier than usual.  ? A severe headache or confusion.  These symptoms may be an emergency. Get help  right away. Call 911.   Do not wait to see if the symptoms will go away.     Do not drive yourself to the hospital.  Summary   Deep vein thrombosis (DVT) happens when a blood clot forms in a deep vein. This may occur in the lower leg, thigh, pelvis, arm, or neck.   Symptoms affect the arm or leg and can include swelling, pain, tenderness, warmth, redness, or discoloration.   This condition may be treated with medicines. In severe cases, a procedure or surgery may be done to remove or dissolve the clots.   If you are taking blood thinners, take them exactly as told. Do not skip a dose. Do not take more than is prescribed.   Get help right away if you have a severe headache, shortness of breath, chest pain, fast or irregular heartbeats, or blood in your vomit, urine, or stool.  This information is not intended to replace advice given to you by your health care provider. Make sure you discuss any questions you have with your health care provider.  Document Released: 2005-08-06 Document Updated: 2021-02-27 Document Reviewed: 2021-02-27  Elsevier Patient Education ? 2025 [employer].

## 2024-07-23 MED ORDER — APIXABAN 2.5 MG TABLET
2.5 | Freq: Once | ORAL | Status: AC
Start: 2024-07-23 — End: 2024-07-22
  Administered 2024-07-23: 10 mg via ORAL

## 2024-07-23 MED ORDER — APIXABAN 5 MG TABLET
5 | ORAL_TABLET | ORAL | 0 refills | 30.00000 days | Status: AC
Start: 2024-07-23 — End: 2024-08-21

## 2024-07-23 MED FILL — APIXABAN 2.5 MG TABLET: 2.5 2.5 mg | ORAL | Qty: 4 | Fill #0

## 2024-07-24 NOTE — Telephone Encounter (Signed)
 Pt was seen in ED on 07/22/24 for DVT. Was told by ED physician to call us  to see if Dr. Cosette would like to order a CT scan of the chest to see if there are any blood clots in her lungs.     Please advise

## 2024-07-28 NOTE — Telephone Encounter (Signed)
 LVMM for pt to call back office.

## 2024-08-05 ENCOUNTER — Inpatient Hospital Stay: Admit: 2024-08-05 | Payer: MEDICARE | Primary: Internal Medicine

## 2024-08-05 DIAGNOSIS — I82442 Acute embolism and thrombosis of left tibial vein: Principal | ICD-10-CM

## 2024-08-26 ENCOUNTER — Ambulatory Visit: Payer: MEDICARE | Primary: Internal Medicine

## 2024-09-01 ENCOUNTER — Ambulatory Visit: Payer: MEDICARE | Primary: Internal Medicine

## 2024-09-03 ENCOUNTER — Encounter: Payer: MEDICARE | Attending: Family | Primary: Internal Medicine

## 2024-09-03 NOTE — Progress Notes (Unsigned)
 [facility]  [facility]  [address]  [address] [place] [address]  Dept: [phone]  Dept Fax: [phone]     MVC PROGRESS NOTE    Reason for visit: follow up    HPI: Molly Jensen is a 74 y.o. female last visit 8/25 with Dr. Cyrena (me 6/25) in pre-op evaluation prior to back surgery.   Last note:  last visit 01/2024 w NP Elpidio. Reported had fallen 4x in 4 wks, 8x in 45mo. No LOC. Lisinopril  decreased to 10mg  daily. 30d event monitor unremarkable.  Today, reports tired overall., in the middle of moving. Pending back surgery.  Fell 8 times in 7 mos. Never has LOC. No SOB or CP. No palpitations.     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 Passed from complications of spinal stenosis at age 44  2 sisters w DM and HTN     Social history:  Never smoker. No alcohol.    ROS: As above all others reviewed and are negative    EXAM:       07/22/2024     2:08 PM 07/22/2024     5:14 PM 07/22/2024     6:03 PM 07/22/2024     7:15 PM   Vitals   Systolic 168 169 817 148   Diastolic 99 97 97 100   Pulse 83 88 71 65   Temp 36.7 C (98.1 F) 36.7 C (98 F)  36.6 C (97.9 F)   Resp 16 18 18 16    SpO2 98 % 96 % 96 % 98 %        General: Pleasant elderly female in NAD  Neck: No carotid bruits and no inc JVD  Cardiac: Normal rate and rhythm, normal S1 and S2 without murmur  Lungs: Clear to auscultation bilaterally  Abd: Soft, non distended  Ext: No lower ext edema bilaterally  Neuro: Alert and oriented and grossly non-focal  Psych: mood appropriate    DATA:     Lab Results   Component Value  Date    GLUCOSE 93 07/22/2024    CALCIUM 9.4 07/22/2024    NA 141 07/22/2024    K 3.4 (L) 07/22/2024    CO2 25 07/22/2024    CL 109 07/22/2024    BUN 10 07/22/2024    CREATININE 0.99 07/22/2024     Lab Results   Component Value Date    ALT 21 07/22/2024    AST 17 07/22/2024    ALKPHOS 138 (H) 07/22/2024    BILITOT 0.5 07/22/2024      Lab Results   Component Value Date    WBC 7.2 07/22/2024    HGB 13.3 07/22/2024    HCT 41.5 07/22/2024    MCV 91.8 07/22/2024    PLT 416 (H) 07/22/2024      Lab Results   Component Value Date    CHOL 143 12/16/2023    HDL 56 12/16/2023    LDLCALC 69 12/16/2023    TRIG  100 12/16/2023     Lab Results   Component Value Date    HGBA1C 5.8 (H) 08/21/2023     Lab Results   Component Value Date    TSH 1.740 09/17/2023         Encounter Date: 04/22/24   ECG 12 lead    Narrative    HEART RATE: 93  RR Interval: 648  P-R Interval: 170  P Duration: 124  P Front Axis: 44  QRSD Interval: 85  QT Interval: 401  QTcB: 498  QTcF: 464  QRS Axis: -29  T Wave Axis: 14  QTc Framingham: 455  QTc Hodges: 458  ECG Severity:   - NORMAL ECG -  ECG Impression:   Sinus rhythm  Abnormal R-wave progression, early transition  No significant change compared to prior         TTE 08/13/23 - Normal. EF 60%. Asc ao 3.6 cm, Ao root 3.3cm.  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 CAD noted.  CXR 10/03/23 - Tortuous aorta with atherosclerotic calcifications.     30d event monitor 01/2024  Patient was provided a BioTel patch looping event monitor for 30 days of which 17 hours 9 hours 12 minutes  had usable data.   Predominant rhythm was sinus rhythm at an average of 75 bpm.   There was no supraventricular ectopy.  There were rare isolated PVCs.   Symptoms reported were dizzy, light headed and heart racing did not correlate with any ectopy or arrhythmia. Short of breath and unspecified correlated with PVC.   Unremarkable event monitor. No arrhythmia noted. Rare  ectopy. Symptoms correlated with sinus with HR 71-121 bpm, not associated with ectopy.    IMP/PLAN:    Dilated ascending aorta  Measured normal on TTE 07/2023, but up to 4.2cm on CT 08/2023. On my review stable at 4.2cm on CT 02/2024.  -ongoing surveillance w CT given discrepancy in measurements between TTE and CT, next due next year, can order at next appt  -BP control as below     HTN  Blood pressure wnl  -continue lisinopril  10 mg daily   -Continue to monitor home blood pressures for goal less than 130/80 and instructed to call if consistently above goal. Check blood pressure when having episodes of lightheadedness.       Dyslipidemia  LDL 69, 11/2023  -continue pravastatin  10 mg daily given CAD on CT chest     Frequent falls  Likely related to her severe spinal stenosis. 30d event monitor without arrhythmia or significant ectopy    Chest pain  Atypical, primarily at night when she has had a bad day (in relation to her chronic pain). Consider stress testing if she develops worsening or exertional symptoms. Advised to call for chest, arm or jaw symptoms and 911 for persistent symptoms greater than 20 minutes.       Reviewed all notes, labs, imaging/testing and discussed results and follow up, >40 minutes spent in care of patient today.    CHERENE RELIC, NP            [1]   Current Outpatient Medications:     acetaminophen  (Tylenol ) 500 mg tablet, Take 1,000 mg by mouth every 6 (six) hours if needed for pain score 1-3 (mild)., Disp: , Rfl:     amLODIPine  (Norvasc ) 5 mg tablet, Take 5 mg by mouth once daily., Disp: , Rfl:     apixaban  (Eliquis ) 5  mg tablet, Take 2 tablets (10 mg) by mouth twice daily for 7 days, THEN 1 tablet (5 mg) twice daily for 23 days., Disp: 74 tablet, Rfl: 0    Breyna  160-4.5 mcg/actuation inhaler, Inhale 2 puffs in the morning and at bedtime. Rinse mouth with water  after use to reduce aftertaste and incidence of candidiasis. Do not swallow., Disp: 3 each, Rfl: 2    citalopram  (CeleXA ) 20 mg  tablet, Take 1 tablet (20 mg) by mouth once daily., Disp: 30 tablet, Rfl: 0    docusate sodium  (Colace) 100 mg capsule, Take 1 capsule (100 mg) by mouth twice daily., Disp: 60 capsule, Rfl: 0    gabapentin  (Neurontin ) 300 mg capsule, TAKE 1 CAPSULE(300 MG) BY MOUTH TWICE DAILY, Disp: 60 capsule, Rfl: 3    lisinopril  10 mg tablet, Take 1 tablet (10 mg) by mouth once daily. (Patient taking differently: Take 20 mg by mouth once daily. Indications: high blood pressure), Disp: 90 tablet, Rfl: 3    pravastatin  (Pravachol ) 10 mg tablet, Take 1 tablet (10 mg) by mouth once daily. (Patient taking differently: Take 10 mg by mouth at bedtime.), Disp: 90 tablet, Rfl: 3    tiZANidine  (Zanaflex ) 2 mg capsule, Take 1 capsule (2 mg) by mouth if needed in the morning and at bedtime for muscle spasms for up to 10 days. (Patient not taking: Reported on 06/17/2024), Disp: 20 capsule, Rfl: 1    tolterodine  LA (Detrol  LA) 4 mg 24 hr capsule, Take 4 mg by mouth before bedtime., Disp: , Rfl:     Ventolin  HFA 90 mcg/actuation inhaler, Inhale 1 puff every 4 (four) hours if needed., Disp: , Rfl:   [2]   Allergies  Allergen Reactions    Latex Rash     Red bumps in mouth

## 2024-10-06 ENCOUNTER — Encounter: Payer: MEDICARE | Attending: Critical Care Medicine | Primary: Internal Medicine
